# Patient Record
Sex: Male | Born: 1940
Health system: Southern US, Community
[De-identification: ages and names within clinical notes are randomized; demographics above are authoritative.]

## PROBLEM LIST (undated history)

## (undated) ENCOUNTER — Emergency Department (HOSPITAL_COMMUNITY): Discharge status: Absent without leave | Payer: Medicare Other

## (undated) DIAGNOSIS — T1491XA Suicide attempt, initial encounter: Secondary | ICD-10-CM

## (undated) DIAGNOSIS — I219 Acute myocardial infarction, unspecified: Secondary | ICD-10-CM

## (undated) DIAGNOSIS — F329 Major depressive disorder, single episode, unspecified: Secondary | ICD-10-CM

## (undated) DIAGNOSIS — I2510 Atherosclerotic heart disease of native coronary artery without angina pectoris: Secondary | ICD-10-CM

## (undated) DIAGNOSIS — D509 Iron deficiency anemia, unspecified: Secondary | ICD-10-CM

## (undated) DIAGNOSIS — R0602 Shortness of breath: Secondary | ICD-10-CM

## (undated) DIAGNOSIS — I6509 Occlusion and stenosis of unspecified vertebral artery: Secondary | ICD-10-CM

## (undated) DIAGNOSIS — E785 Hyperlipidemia, unspecified: Secondary | ICD-10-CM

## (undated) DIAGNOSIS — D126 Benign neoplasm of colon, unspecified: Secondary | ICD-10-CM

## (undated) DIAGNOSIS — M1990 Unspecified osteoarthritis, unspecified site: Secondary | ICD-10-CM

## (undated) DIAGNOSIS — F0280 Dementia in other diseases classified elsewhere without behavioral disturbance: Secondary | ICD-10-CM

## (undated) DIAGNOSIS — C4492 Squamous cell carcinoma of skin, unspecified: Secondary | ICD-10-CM

## (undated) DIAGNOSIS — R55 Syncope and collapse: Secondary | ICD-10-CM

## (undated) DIAGNOSIS — K922 Gastrointestinal hemorrhage, unspecified: Secondary | ICD-10-CM

## (undated) DIAGNOSIS — I10 Essential (primary) hypertension: Secondary | ICD-10-CM

## (undated) DIAGNOSIS — F32A Depression, unspecified: Secondary | ICD-10-CM

## (undated) DIAGNOSIS — C4491 Basal cell carcinoma of skin, unspecified: Secondary | ICD-10-CM

## (undated) DIAGNOSIS — F0390 Unspecified dementia without behavioral disturbance: Secondary | ICD-10-CM

## (undated) DIAGNOSIS — K449 Diaphragmatic hernia without obstruction or gangrene: Secondary | ICD-10-CM

## (undated) DIAGNOSIS — G309 Alzheimer's disease, unspecified: Secondary | ICD-10-CM

## (undated) DIAGNOSIS — C61 Malignant neoplasm of prostate: Secondary | ICD-10-CM

## (undated) DIAGNOSIS — K219 Gastro-esophageal reflux disease without esophagitis: Secondary | ICD-10-CM

## (undated) DIAGNOSIS — J9383 Other pneumothorax: Secondary | ICD-10-CM

## (undated) DIAGNOSIS — K279 Peptic ulcer, site unspecified, unspecified as acute or chronic, without hemorrhage or perforation: Secondary | ICD-10-CM

## (undated) DIAGNOSIS — K760 Fatty (change of) liver, not elsewhere classified: Secondary | ICD-10-CM

## (undated) DIAGNOSIS — K319 Disease of stomach and duodenum, unspecified: Secondary | ICD-10-CM

## (undated) HISTORY — DX: Gastrointestinal hemorrhage, unspecified: K92.2

## (undated) HISTORY — DX: Peptic ulcer, site unspecified, unspecified as acute or chronic, without hemorrhage or perforation: K27.9

## (undated) HISTORY — DX: Unspecified osteoarthritis, unspecified site: M19.90

## (undated) HISTORY — DX: Diaphragmatic hernia without obstruction or gangrene: K44.9

## (undated) HISTORY — PX: OTHER SURGICAL HISTORY: SHX169

## (undated) HISTORY — PX: CATARACT EXTRACTION: SUR2

## (undated) HISTORY — DX: Malignant neoplasm of prostate: C61

## (undated) HISTORY — DX: Squamous cell carcinoma of skin, unspecified: C44.92

## (undated) HISTORY — PX: CARDIAC SURGERY: SHX584

## (undated) HISTORY — DX: Benign neoplasm of colon, unspecified: D12.6

## (undated) HISTORY — DX: Basal cell carcinoma of skin, unspecified: C44.91

## (undated) HISTORY — DX: Fatty (change of) liver, not elsewhere classified: K76.0

## (undated) HISTORY — PX: PROSTATECTOMY: SHX69

## (undated) HISTORY — PX: LUNG REMOVAL, PARTIAL: SHX233

## (undated) HISTORY — DX: Essential (primary) hypertension: I10

## (undated) HISTORY — DX: Gastro-esophageal reflux disease without esophagitis: K21.9

## (undated) HISTORY — DX: Hyperlipidemia, unspecified: E78.5

## (undated) HISTORY — DX: Disease of stomach and duodenum, unspecified: K31.9

## (undated) HISTORY — DX: Iron deficiency anemia, unspecified: D50.9

## (undated) HISTORY — DX: Occlusion and stenosis of unspecified vertebral artery: I65.09

## (undated) HISTORY — DX: Other pneumothorax: J93.83

---

## 2000-01-02 ENCOUNTER — Encounter: Payer: Self-pay | Admitting: Cardiology

## 2000-01-02 ENCOUNTER — Ambulatory Visit (HOSPITAL_COMMUNITY): Admission: RE | Admit: 2000-01-02 | Discharge: 2000-01-02 | Payer: Self-pay | Admitting: Cardiology

## 2000-04-30 ENCOUNTER — Inpatient Hospital Stay (HOSPITAL_COMMUNITY): Admission: EM | Admit: 2000-04-30 | Discharge: 2000-05-01 | Payer: Self-pay | Admitting: Emergency Medicine

## 2000-04-30 ENCOUNTER — Encounter: Payer: Self-pay | Admitting: Emergency Medicine

## 2000-05-09 ENCOUNTER — Emergency Department (HOSPITAL_COMMUNITY): Admission: EM | Admit: 2000-05-09 | Discharge: 2000-05-09 | Payer: Self-pay | Admitting: Emergency Medicine

## 2001-02-18 ENCOUNTER — Ambulatory Visit (HOSPITAL_COMMUNITY): Admission: RE | Admit: 2001-02-18 | Discharge: 2001-02-18 | Payer: Self-pay | Admitting: Gastroenterology

## 2001-06-24 ENCOUNTER — Ambulatory Visit (HOSPITAL_COMMUNITY): Admission: RE | Admit: 2001-06-24 | Discharge: 2001-06-24 | Payer: Self-pay | Admitting: *Deleted

## 2002-08-21 ENCOUNTER — Inpatient Hospital Stay (HOSPITAL_COMMUNITY): Admission: EM | Admit: 2002-08-21 | Discharge: 2002-08-22 | Payer: Self-pay | Admitting: Emergency Medicine

## 2002-08-21 ENCOUNTER — Encounter: Payer: Self-pay | Admitting: Emergency Medicine

## 2002-08-22 ENCOUNTER — Encounter: Payer: Self-pay | Admitting: Cardiology

## 2008-07-13 ENCOUNTER — Ambulatory Visit (HOSPITAL_COMMUNITY): Admission: RE | Admit: 2008-07-13 | Discharge: 2008-07-13 | Payer: Self-pay | Admitting: Urology

## 2008-09-30 ENCOUNTER — Ambulatory Visit (HOSPITAL_COMMUNITY): Admission: RE | Admit: 2008-09-30 | Discharge: 2008-09-30 | Payer: Self-pay | Admitting: Cardiology

## 2008-10-05 ENCOUNTER — Inpatient Hospital Stay (HOSPITAL_COMMUNITY): Admission: RE | Admit: 2008-10-05 | Discharge: 2008-10-06 | Payer: Self-pay | Admitting: Urology

## 2008-10-05 ENCOUNTER — Encounter (INDEPENDENT_AMBULATORY_CARE_PROVIDER_SITE_OTHER): Payer: Self-pay | Admitting: Urology

## 2010-09-01 ENCOUNTER — Emergency Department (HOSPITAL_COMMUNITY): Admission: EM | Admit: 2010-09-01 | Discharge: 2010-09-01 | Payer: Self-pay | Admitting: Emergency Medicine

## 2010-09-05 ENCOUNTER — Encounter: Admission: RE | Admit: 2010-09-05 | Discharge: 2010-09-05 | Payer: Self-pay | Admitting: Cardiology

## 2010-09-17 ENCOUNTER — Ambulatory Visit (HOSPITAL_COMMUNITY): Admission: RE | Admit: 2010-09-17 | Discharge: 2010-09-17 | Payer: Self-pay | Admitting: Cardiology

## 2010-09-20 ENCOUNTER — Encounter: Payer: Self-pay | Admitting: Interventional Radiology

## 2010-10-03 ENCOUNTER — Inpatient Hospital Stay (HOSPITAL_COMMUNITY): Admission: RE | Admit: 2010-10-03 | Discharge: 2010-10-04 | Payer: Self-pay | Admitting: Interventional Radiology

## 2010-10-17 ENCOUNTER — Encounter: Payer: Self-pay | Admitting: Interventional Radiology

## 2010-11-21 ENCOUNTER — Ambulatory Visit (HOSPITAL_COMMUNITY)
Admission: RE | Admit: 2010-11-21 | Discharge: 2010-11-21 | Payer: Self-pay | Source: Home / Self Care | Attending: Interventional Radiology | Admitting: Interventional Radiology

## 2010-11-26 ENCOUNTER — Ambulatory Visit (HOSPITAL_COMMUNITY)
Admission: RE | Admit: 2010-11-26 | Discharge: 2010-11-26 | Payer: Self-pay | Source: Home / Self Care | Attending: Interventional Radiology | Admitting: Interventional Radiology

## 2010-11-26 ENCOUNTER — Encounter (INDEPENDENT_AMBULATORY_CARE_PROVIDER_SITE_OTHER): Payer: Self-pay | Admitting: Interventional Radiology

## 2010-12-16 DIAGNOSIS — D126 Benign neoplasm of colon, unspecified: Secondary | ICD-10-CM

## 2010-12-16 DIAGNOSIS — K319 Disease of stomach and duodenum, unspecified: Secondary | ICD-10-CM

## 2010-12-16 HISTORY — DX: Disease of stomach and duodenum, unspecified: K31.9

## 2010-12-16 HISTORY — DX: Benign neoplasm of colon, unspecified: D12.6

## 2011-02-26 LAB — CREATININE, SERUM
Creatinine, Ser: 1.01 mg/dL (ref 0.4–1.5)
GFR calc Af Amer: >60 mL/min (ref >60)
GFR calc non Af Amer: >60 mL/min (ref >60)

## 2011-02-26 LAB — BUN: BUN: 11 mg/dL (ref 6–23)

## 2011-02-27 LAB — BASIC METABOLIC PANEL
BUN: 11 mg/dL (ref 6–23)
BUN: 6 mg/dL (ref 6–23)
CO2: 24 mEq/L (ref 19–32)
CO2: 30 mEq/L (ref 19–32)
Calcium: 8.7 mg/dL (ref 8.4–10.5)
Calcium: 9.6 mg/dL (ref 8.4–10.5)
Chloride: 104 mEq/L (ref 96–112)
Chloride: 109 mEq/L (ref 96–112)
Creatinine, Ser: 0.98 mg/dL (ref 0.4–1.5)
Creatinine, Ser: 1.03 mg/dL (ref 0.4–1.5)
GFR calc Af Amer: >60 mL/min (ref >60)
GFR calc Af Amer: >60 mL/min (ref >60)
GFR calc non Af Amer: >60 mL/min (ref >60)
GFR calc non Af Amer: >60 mL/min (ref >60)
Glucose, Bld: 91 mg/dL (ref 70–99)
Glucose, Bld: 99 mg/dL (ref 70–99)
Potassium: 3.6 mEq/L (ref 3.5–5.1)
Potassium: 4.6 mEq/L (ref 3.5–5.1)
Sodium: 140 mEq/L (ref 135–145)
Sodium: 141 mEq/L (ref 135–145)

## 2011-02-27 LAB — CBC
HCT: 40.7 % (ref 39.0–52.0)
HCT: 42.1 % (ref 39.0–52.0)
HCT: 44.3 % (ref 39.0–52.0)
Hemoglobin: 13.9 g/dL (ref 13.0–17.0)
Hemoglobin: 14.4 g/dL (ref 13.0–17.0)
Hemoglobin: 14.7 g/dL (ref 13.0–17.0)
MCH: 28 pg (ref 26.0–34.0)
MCH: 28.7 pg (ref 26.0–34.0)
MCH: 28.9 pg (ref 26.0–34.0)
MCHC: 33.2 g/dL (ref 30.0–36.0)
MCHC: 34.2 g/dL (ref 30.0–36.0)
MCHC: 34.2 g/dL (ref 30.0–36.0)
MCV: 83.9 fL (ref 78.0–100.0)
MCV: 84.4 fL (ref 78.0–100.0)
MCV: 84.5 fL (ref 78.0–100.0)
Platelets: 194 10*3/uL (ref 150–400)
Platelets: 196 10*3/uL (ref 150–400)
Platelets: 203 10*3/uL (ref 150–400)
RBC: 4.85 MIL/uL (ref 4.22–5.81)
RBC: 4.98 MIL/uL (ref 4.22–5.81)
RBC: 5.25 MIL/uL (ref 4.22–5.81)
RDW: 14.2 % (ref 11.5–15.5)
RDW: 14.4 % (ref 11.5–15.5)
RDW: 14.6 % (ref 11.5–15.5)
WBC: 4.5 10*3/uL (ref 4.0–10.5)
WBC: 8.7 10*3/uL (ref 4.0–10.5)
WBC: 9.5 10*3/uL (ref 4.0–10.5)

## 2011-02-27 LAB — DIFFERENTIAL
Basophils Absolute: 0 10*3/uL (ref 0.0–0.1)
Basophils Relative: 1 % (ref 0–1)
Eosinophils Absolute: 0.1 10*3/uL (ref 0.0–0.7)
Eosinophils Relative: 2 % (ref 0–5)
Lymphocytes Relative: 35 % (ref 12–46)
Lymphs Abs: 1.6 10*3/uL (ref 0.7–4.0)
Monocytes Absolute: 0.6 10*3/uL (ref 0.1–1.0)
Monocytes Relative: 13 % — ABNORMAL HIGH (ref 3–12)
Neutro Abs: 2.2 10*3/uL (ref 1.7–7.7)
Neutrophils Relative %: 49 % (ref 43–77)

## 2011-02-27 LAB — APTT
aPTT: 29 seconds (ref 24–37)
aPTT: 45 seconds — ABNORMAL HIGH (ref 24–37)

## 2011-02-27 LAB — PROTIME-INR
INR: 1.1 (ref 0.00–1.49)
INR: 1.17 (ref 0.00–1.49)
Prothrombin Time: 14.4 seconds (ref 11.6–15.2)
Prothrombin Time: 15.1 seconds (ref 11.6–15.2)

## 2011-02-27 LAB — HEPARIN LEVEL (UNFRACTIONATED): Heparin Unfractionated: 0.37 IU/mL (ref 0.30–0.70)

## 2011-02-27 LAB — MRSA PCR SCREENING: MRSA by PCR: NEGATIVE

## 2011-02-28 LAB — CBC
HCT: 43.8 % (ref 39.0–52.0)
HCT: 44.3 % (ref 39.0–52.0)
Hemoglobin: 15 g/dL (ref 13.0–17.0)
Hemoglobin: 15.1 g/dL (ref 13.0–17.0)
MCH: 28.6 pg (ref 26.0–34.0)
MCH: 29.3 pg (ref 26.0–34.0)
MCHC: 34.2 g/dL (ref 30.0–36.0)
MCHC: 34.2 g/dL (ref 30.0–36.0)
MCV: 83.6 fL (ref 78.0–100.0)
MCV: 85.7 fL (ref 78.0–100.0)
Platelets: 200 10*3/uL (ref 150–400)
Platelets: 218 10*3/uL (ref 150–400)
RBC: 5.24 MIL/uL (ref 4.22–5.81)
RBC: 5.17 MIL/uL (ref 4.22–5.81)
RDW: 14.1 % (ref 11.5–15.5)
RDW: 14.7 % (ref 11.5–15.5)
WBC: 5.7 10*3/uL (ref 4.0–10.5)
WBC: 6.6 10*3/uL (ref 4.0–10.5)

## 2011-02-28 LAB — BASIC METABOLIC PANEL
BUN: 12 mg/dL (ref 6–23)
BUN: 11 mg/dL (ref 6–23)
CO2: 29 mEq/L (ref 19–32)
CO2: 28 mEq/L (ref 19–32)
Calcium: 9.2 mg/dL (ref 8.4–10.5)
Calcium: 9.1 mg/dL (ref 8.4–10.5)
Chloride: 106 mEq/L (ref 96–112)
Chloride: 106 mEq/L (ref 96–112)
Creatinine, Ser: 0.98 mg/dL (ref 0.4–1.5)
Creatinine, Ser: 1.04 mg/dL (ref 0.4–1.5)
GFR calc Af Amer: >60 mL/min (ref >60)
GFR calc Af Amer: >60 mL/min (ref >60)
GFR calc non Af Amer: >60 mL/min (ref >60)
GFR calc non Af Amer: >60 mL/min (ref >60)
Glucose, Bld: 91 mg/dL (ref 70–99)
Glucose, Bld: 88 mg/dL (ref 70–99)
Potassium: 4.8 mEq/L (ref 3.5–5.1)
Potassium: 4.1 mEq/L (ref 3.5–5.1)
Sodium: 139 mEq/L (ref 135–145)
Sodium: 139 mEq/L (ref 135–145)

## 2011-02-28 LAB — DIFFERENTIAL
Basophils Absolute: 0 10*3/uL (ref 0.0–0.1)
Basophils Relative: 0 % (ref 0–1)
Eosinophils Absolute: 0.1 10*3/uL (ref 0.0–0.7)
Eosinophils Relative: 1 % (ref 0–5)
Lymphocytes Relative: 21 % (ref 12–46)
Lymphs Abs: 1.4 10*3/uL (ref 0.7–4.0)
Monocytes Absolute: 0.5 10*3/uL (ref 0.1–1.0)
Monocytes Relative: 8 % (ref 3–12)
Neutro Abs: 4.6 10*3/uL (ref 1.7–7.7)
Neutrophils Relative %: 69 % (ref 43–77)

## 2011-02-28 LAB — POCT CARDIAC MARKERS
CKMB, poc: <1 ng/mL — ABNORMAL LOW (ref 1.0–8.0)
CKMB, poc: <1 ng/mL — ABNORMAL LOW (ref 1.0–8.0)
Myoglobin, poc: 47.8 ng/mL (ref 12–200)
Myoglobin, poc: 63.1 ng/mL (ref 12–200)
Troponin i, poc: <0.05 ng/mL (ref 0.00–0.09)
Troponin i, poc: <0.05 ng/mL (ref 0.00–0.09)

## 2011-02-28 LAB — URINALYSIS, ROUTINE W REFLEX MICROSCOPIC
Bilirubin Urine: NEGATIVE
Glucose, UA: NEGATIVE mg/dL
Hgb urine dipstick: NEGATIVE
Ketones, ur: NEGATIVE mg/dL
Nitrite: NEGATIVE
Protein, ur: NEGATIVE mg/dL
Specific Gravity, Urine: 1.005 (ref 1.005–1.030)
Urobilinogen, UA: 0.2 mg/dL (ref 0.0–1.0)
pH: 7 (ref 5.0–8.0)

## 2011-02-28 LAB — GLUCOSE, CAPILLARY: Glucose-Capillary: 93 mg/dL (ref 70–99)

## 2011-02-28 LAB — PROTIME-INR
INR: 1.02 (ref 0.00–1.49)
Prothrombin Time: 13.6 seconds (ref 11.6–15.2)

## 2011-04-29 ENCOUNTER — Inpatient Hospital Stay (HOSPITAL_COMMUNITY)
Admission: EM | Admit: 2011-04-29 | Discharge: 2011-05-08 | DRG: 379 | Disposition: A | Discharge status: Home / Self Care | Payer: Medicare Other | Attending: Cardiology | Admitting: Cardiology

## 2011-04-29 ENCOUNTER — Emergency Department (HOSPITAL_COMMUNITY)
Admission: EM | Admit: 2011-04-29 | Discharge: 2011-04-29 | Disposition: A | Discharge status: Other Acute Inpatient Hospital | Payer: Medicare Other | Attending: Emergency Medicine | Admitting: Emergency Medicine

## 2011-04-29 DIAGNOSIS — I679 Cerebrovascular disease, unspecified: Secondary | ICD-10-CM | POA: Diagnosis present

## 2011-04-29 DIAGNOSIS — K5900 Constipation, unspecified: Secondary | ICD-10-CM | POA: Diagnosis present

## 2011-04-29 DIAGNOSIS — R55 Syncope and collapse: Secondary | ICD-10-CM | POA: Insufficient documentation

## 2011-04-29 DIAGNOSIS — E7800 Pure hypercholesterolemia, unspecified: Secondary | ICD-10-CM | POA: Insufficient documentation

## 2011-04-29 DIAGNOSIS — Z8546 Personal history of malignant neoplasm of prostate: Secondary | ICD-10-CM

## 2011-04-29 DIAGNOSIS — M1990 Unspecified osteoarthritis, unspecified site: Secondary | ICD-10-CM | POA: Diagnosis present

## 2011-04-29 DIAGNOSIS — R42 Dizziness and giddiness: Secondary | ICD-10-CM | POA: Insufficient documentation

## 2011-04-29 DIAGNOSIS — K922 Gastrointestinal hemorrhage, unspecified: Secondary | ICD-10-CM | POA: Insufficient documentation

## 2011-04-29 DIAGNOSIS — K219 Gastro-esophageal reflux disease without esophagitis: Secondary | ICD-10-CM | POA: Diagnosis present

## 2011-04-29 DIAGNOSIS — Z7982 Long term (current) use of aspirin: Secondary | ICD-10-CM

## 2011-04-29 DIAGNOSIS — K2971 Gastritis, unspecified, with bleeding: Principal | ICD-10-CM | POA: Diagnosis present

## 2011-04-29 DIAGNOSIS — Z79899 Other long term (current) drug therapy: Secondary | ICD-10-CM | POA: Insufficient documentation

## 2011-04-29 DIAGNOSIS — I252 Old myocardial infarction: Secondary | ICD-10-CM | POA: Insufficient documentation

## 2011-04-29 DIAGNOSIS — I10 Essential (primary) hypertension: Secondary | ICD-10-CM | POA: Diagnosis present

## 2011-04-29 DIAGNOSIS — I771 Stricture of artery: Secondary | ICD-10-CM

## 2011-04-29 DIAGNOSIS — R61 Generalized hyperhidrosis: Secondary | ICD-10-CM | POA: Insufficient documentation

## 2011-04-29 DIAGNOSIS — D500 Iron deficiency anemia secondary to blood loss (chronic): Secondary | ICD-10-CM | POA: Diagnosis present

## 2011-04-29 DIAGNOSIS — Z87891 Personal history of nicotine dependence: Secondary | ICD-10-CM

## 2011-04-29 DIAGNOSIS — R195 Other fecal abnormalities: Secondary | ICD-10-CM | POA: Insufficient documentation

## 2011-04-29 DIAGNOSIS — D649 Anemia, unspecified: Secondary | ICD-10-CM | POA: Insufficient documentation

## 2011-04-29 DIAGNOSIS — Z9889 Other specified postprocedural states: Secondary | ICD-10-CM | POA: Insufficient documentation

## 2011-04-30 ENCOUNTER — Other Ambulatory Visit (HOSPITAL_COMMUNITY): Payer: Self-pay

## 2011-04-30 ENCOUNTER — Emergency Department (HOSPITAL_COMMUNITY): Payer: Medicare Other

## 2011-04-30 LAB — CBC
HCT: 19.1 % — ABNORMAL LOW (ref 39.0–52.0)
HCT: 24 % — ABNORMAL LOW (ref 39.0–52.0)
Hemoglobin: 6.7 g/dL — CL (ref 13.0–17.0)
Hemoglobin: 8.7 g/dL — ABNORMAL LOW (ref 13.0–17.0)
MCH: 28.5 pg (ref 26.0–34.0)
MCH: 30.2 pg (ref 26.0–34.0)
MCHC: 35.1 g/dL (ref 30.0–36.0)
MCHC: 36.3 g/dL — ABNORMAL HIGH (ref 30.0–36.0)
MCV: 81.3 fL (ref 78.0–100.0)
MCV: 83.3 fL (ref 78.0–100.0)
Platelets: 185 10*3/uL (ref 150–400)
Platelets: 139 10*3/uL — ABNORMAL LOW (ref 150–400)
RBC: 2.35 MIL/uL — ABNORMAL LOW (ref 4.22–5.81)
RBC: 2.88 MIL/uL — ABNORMAL LOW (ref 4.22–5.81)
RDW: 15.3 % (ref 11.5–15.5)
RDW: 14.9 % (ref 11.5–15.5)
WBC: 8.8 10*3/uL (ref 4.0–10.5)
WBC: 7.4 10*3/uL (ref 4.0–10.5)

## 2011-04-30 LAB — FERRITIN: Ferritin: 33 ng/mL (ref 22–322)

## 2011-04-30 LAB — LIPID PANEL
Cholesterol: 113 mg/dL (ref 0–200)
HDL: 43 mg/dL (ref >39)
LDL Cholesterol: 56 mg/dL (ref 0–99)
Total CHOL/HDL Ratio: 2.6 RATIO
Triglycerides: 68 mg/dL (ref <150)
VLDL: 14 mg/dL (ref 0–40)

## 2011-04-30 LAB — CK TOTAL AND CKMB (NOT AT ARMC)
CK, MB: 2.5 ng/mL (ref 0.3–4.0)
Relative Index: 1.7 (ref 0.0–2.5)
Total CK: 149 U/L (ref 7–232)

## 2011-04-30 LAB — VITAMIN B12: Vitamin B-12: 149 pg/mL — ABNORMAL LOW (ref 211–911)

## 2011-04-30 LAB — BASIC METABOLIC PANEL
BUN: 29 mg/dL — ABNORMAL HIGH (ref 6–23)
CO2: 26 mEq/L (ref 19–32)
Calcium: 8.6 mg/dL (ref 8.4–10.5)
Chloride: 106 mEq/L (ref 96–112)
Creatinine, Ser: 0.94 mg/dL (ref 0.4–1.5)
GFR calc Af Amer: >60 mL/min (ref >60)
GFR calc non Af Amer: >60 mL/min (ref >60)
Glucose, Bld: 129 mg/dL — ABNORMAL HIGH (ref 70–99)
Potassium: 3.6 mEq/L (ref 3.5–5.1)
Sodium: 137 mEq/L (ref 135–145)

## 2011-04-30 LAB — OCCULT BLOOD, POC DEVICE: Fecal Occult Bld: POSITIVE

## 2011-04-30 LAB — CARDIAC PANEL(CRET KIN+CKTOT+MB+TROPI)
CK, MB: 2 ng/mL (ref 0.3–4.0)
Relative Index: 1 (ref 0.0–2.5)
Total CK: 200 U/L (ref 7–232)
Troponin I: <0.3 ng/mL (ref <0.30)

## 2011-04-30 LAB — IRON AND TIBC
Iron: 78 ug/dL (ref 42–135)
Saturation Ratios: 25 % (ref 20–55)
TIBC: 311 ug/dL (ref 215–435)
UIBC: 233 ug/dL

## 2011-04-30 LAB — ABO/RH: ABO/RH(D): A POS

## 2011-04-30 LAB — TROPONIN I: Troponin I: <0.3 ng/mL (ref <0.30)

## 2011-04-30 LAB — FOLATE: Folate: 14.5 ng/mL

## 2011-04-30 NOTE — Op Note (Signed)
NAME:  DUKE, WEISENSEL                ACCOUNT NO.:  [id number]   MEDICAL RECORD NO.:  [id number]          PATIENT TYPE:  INP   LOCATION:  0004                         FACILITY:  [facility]   PHYSICIAN:  Lucrezia Starch. Earlene Plater, M.D.  DATE OF BIRTH:  Apr 14, 1941   DATE OF PROCEDURE:  10/05/2008  DATE OF DISCHARGE:                               OPERATIVE REPORT   DIAGNOSIS:  Adenocarcinoma of the prostate.   OPERATIVE PROCEDURE:  Robotic-assisted laparoscopic radical  prostatectomy with bilateral pelvic lymphadenectomy.   SURGEON:  Lucrezia Starch. Earlene Plater, M.D.   ASSISTANT:  Excell Seltzer. Annabell Howells, M.D., and Delia Chimes, N.P.C.   ANESTHESIA:  General endotracheal.   ESTIMATED BLOOD LOSS:  150 mL.   TUBES:  20-French coude Foley catheter and large round Blake drain.   COMPLICATIONS:  None.   INDICATIONS FOR PROCEDURE:  Mr. Gains is a very nice 70 year old white  male who presented with a right palpable prostatic nodule and a PSA of  5.23. He underwent ultrasound and biopsy of the prostate which revealed  a Gleason score 6, 3 + 3, from the right apex of the prostate and a  Gleason score 7, 3 + 3  in 40% of the right mid prostate and then a  Gleason score 8, 4 + 4, in 20% right base of the prostate. His  metastatic workup consisted of a CT scan of the abdomen and pelvis, and  bone scan revealed no metastatic disease. He had a remote cardiac  history and underwent cardiac workup which was essentially negative.  After understanding risks, benefits and alternatives, he has elected to  proceed with robotic-assisted laparoscopic radical prostatectomy.   PROCEDURE IN DETAIL:  The patient was placed in supine position after  proper general endotracheal anesthesia. Was placed in the exaggerated  lithotomy position and prepped and draped with Betadine in sterile  fashion. A 22-French 30-mL balloon Foley was placed, and the bladder was  drained clear. A periumbilical incision was made on the left  periumbilical area,  appropriate measurement. Sharp dissection was  carried down to the fascia. The fascia was incised. The peritoneum was  punctured. A 12-French camera port was placed, and the bladder was  insufflated with carbon dioxide. Inspection of the abdomen revealed  there were no significant abnormalities noted. The bladder was filled  with approximately 200 mL of sterile water, and right and left robotic  arm ports along with fourth arm ports were measured and placed in  appropriate position under direct vision, as was the 5-mm and 12-mm  right working ports. The dissection was begun. The median and medial  umbilical ligaments were taken down. The space of Retzius was entered  and dissected free, and the pelvic fascia was incised bilaterally, and  puboprostatic ligaments were partially incised. Pudendal's were taken  down from the prostatic apex. The superficial dorsal vein of the penis  was coagulated with bipolar and incised, and the deep dorsal vein  complex was identified. Utilizing the endovascular stapler, it was  stapled, and the prostate was released. Bladder neck was then approached  and was taken down from  the prostate carefully. Indigo carmine was given  IV to identify ureteral orifices. The posterior bladder neck was taken  down as was the seminal vesicles were excised, and the ampullae of the  vas deferens were incised. Good hemostasis noted to be present.  Denonvilliers fascia was then taken down posteriorly. It was felt that a  nerve preservation should be prepared on the left side if possible, but  the right needed to be taken wide. He had significant cancer on the  right side on biopsy. A wide resection was then taken down. The right  pedicle was taken down in packets and clipped with Hem-o-lok clips and  taken down to the apex of the prostate. A left nerve spare was performed  utilizing the veil technique. Posterolateral tissue was preserved, and  again, the pedicle was taken in  serial packets and clipped with Hem-o-  lok clips. The apex was then approached. The urethra was incised  sharply, and the prostate was placed in the right lower quadrant. Good  hemostasis was noted to be present. The pelvis was irrigated and  insufflated. The rectum was insufflated with air, and there was no  rectal injury noted. A right and left pelvic lymph node dissection was  then performed. Both the obturator and external iliac lymph nodes were  excised. The obturator nerve was identified and protected bilaterally.  All lymphatics were clipped proximally and distally, and there was a  large accessory obturator vein on the right side that was clipped with  Hem-o-lok clips and excised with the specimen. Following this, the  anastomosis was then approached. The bladder neck was approximated to  the urethra in 6 o'clock position with 2-0 Vicryl suture, and the  anastomosis was completed with running 3-0 Monocryl, both dyed and  undyed tied posteriorly underneath the bladder and in a running fashion.  A 20-French coude catheter was easily passed into the bladder and  inflated with 15 mL of sterile water and irrigated blue dye. Again, good  hemostasis was noted to be present. The specimen was grasped with an  EndoCatch device through the camera port. The right 12-mm working port  was closed under direct vision with a suture passer and a 2-0 Vicryl  suture. All ports were visually removed, and good hemostasis was  present. The specimen was removed through the periumbilical incision,  and the fascia of the periumbilical incision was closed with a running 2-  0 Vicryl suture. All wounds were irrigated, injected with 0.25% Marcaine  and closed with staples. A large round Blake drain had been placed  through the fourth arm port and sutured in place with nylon suture. All  wounds were dressed sterilely. The bladder was again irrigated clear,  and the patient was taken to the recovery room  stable.      Ronald L. Earlene Plater, M.D.  Electronically Signed     RLD/MEDQ  D:  10/05/2008  T:  10/05/2008  Job:  [id number]

## 2011-05-01 ENCOUNTER — Other Ambulatory Visit: Payer: Self-pay | Admitting: Gastroenterology

## 2011-05-01 LAB — CBC
HCT: 24.2 % — ABNORMAL LOW (ref 39.0–52.0)
Hemoglobin: 8.4 g/dL — ABNORMAL LOW (ref 13.0–17.0)
MCH: 29.4 pg (ref 26.0–34.0)
MCHC: 34.7 g/dL (ref 30.0–36.0)
MCV: 84.6 fL (ref 78.0–100.0)
Platelets: 148 10*3/uL — ABNORMAL LOW (ref 150–400)
RBC: 2.86 MIL/uL — ABNORMAL LOW (ref 4.22–5.81)
RDW: 15 % (ref 11.5–15.5)
WBC: 6.3 10*3/uL (ref 4.0–10.5)

## 2011-05-01 LAB — CARDIAC PANEL(CRET KIN+CKTOT+MB+TROPI)
CK, MB: 1.6 ng/mL (ref 0.3–4.0)
Relative Index: 1 (ref 0.0–2.5)
Total CK: 162 U/L (ref 7–232)
Troponin I: <0.3 ng/mL (ref <0.30)

## 2011-05-01 LAB — BASIC METABOLIC PANEL
BUN: 14 mg/dL (ref 6–23)
CO2: 24 mEq/L (ref 19–32)
Calcium: 8.4 mg/dL (ref 8.4–10.5)
Chloride: 108 mEq/L (ref 96–112)
Creatinine, Ser: 0.89 mg/dL (ref 0.4–1.5)
GFR calc Af Amer: >60 mL/min (ref >60)
GFR calc non Af Amer: >60 mL/min (ref >60)
Glucose, Bld: 97 mg/dL (ref 70–99)
Potassium: 3.5 mEq/L (ref 3.5–5.1)
Sodium: 140 mEq/L (ref 135–145)

## 2011-05-01 LAB — GLUCOSE, CAPILLARY: Glucose-Capillary: 137 mg/dL — ABNORMAL HIGH (ref 70–99)

## 2011-05-01 LAB — PREPARE RBC (CROSSMATCH)

## 2011-05-02 ENCOUNTER — Inpatient Hospital Stay (HOSPITAL_COMMUNITY): Payer: Medicare Other

## 2011-05-02 LAB — CROSSMATCH
ABO/RH(D): A POS
Antibody Screen: NEGATIVE
Unit division: 0
Unit division: 0
Unit division: 0
Unit division: 0

## 2011-05-02 LAB — CBC
HCT: 30.3 % — ABNORMAL LOW (ref 39.0–52.0)
Hemoglobin: 10.4 g/dL — ABNORMAL LOW (ref 13.0–17.0)
MCH: 28.7 pg (ref 26.0–34.0)
MCHC: 34.3 g/dL (ref 30.0–36.0)
MCV: 83.7 fL (ref 78.0–100.0)
Platelets: 148 10*3/uL — ABNORMAL LOW (ref 150–400)
RBC: 3.62 MIL/uL — ABNORMAL LOW (ref 4.22–5.81)
RDW: 14.7 % (ref 11.5–15.5)
WBC: 5.9 10*3/uL (ref 4.0–10.5)

## 2011-05-02 LAB — PROTIME-INR
INR: 1.16 (ref 0.00–1.49)
Prothrombin Time: 15 seconds (ref 11.6–15.2)

## 2011-05-03 ENCOUNTER — Inpatient Hospital Stay (HOSPITAL_COMMUNITY): Payer: Medicare Other

## 2011-05-03 DIAGNOSIS — I498 Other specified cardiac arrhythmias: Secondary | ICD-10-CM

## 2011-05-03 LAB — BASIC METABOLIC PANEL
BUN: 10 mg/dL (ref 6–23)
CO2: 27 mEq/L (ref 19–32)
Calcium: 8.4 mg/dL (ref 8.4–10.5)
Chloride: 108 mEq/L (ref 96–112)
Creatinine, Ser: 0.91 mg/dL (ref 0.4–1.5)
GFR calc Af Amer: >60 mL/min (ref >60)
GFR calc non Af Amer: >60 mL/min (ref >60)
Glucose, Bld: 98 mg/dL (ref 70–99)
Potassium: 3.4 mEq/L — ABNORMAL LOW (ref 3.5–5.1)
Sodium: 140 mEq/L (ref 135–145)

## 2011-05-03 LAB — CBC
HCT: 30.2 % — ABNORMAL LOW (ref 39.0–52.0)
Hemoglobin: 10.3 g/dL — ABNORMAL LOW (ref 13.0–17.0)
MCH: 28.8 pg (ref 26.0–34.0)
MCHC: 34.1 g/dL (ref 30.0–36.0)
MCV: 84.4 fL (ref 78.0–100.0)
Platelets: 183 10*3/uL (ref 150–400)
RBC: 3.58 MIL/uL — ABNORMAL LOW (ref 4.22–5.81)
RDW: 15.1 % (ref 11.5–15.5)
WBC: 6 10*3/uL (ref 4.0–10.5)

## 2011-05-03 LAB — APTT: aPTT: 31 seconds (ref 24–37)

## 2011-05-03 MED ORDER — IOHEXOL 300 MG/ML  SOLN
150.0000 mL | Freq: Once | INTRAMUSCULAR | Status: AC | PRN
  Administered 2011-05-03: 70 mL via INTRAVENOUS

## 2011-05-03 NOTE — Discharge Summary (Signed)
[place]. [facility]  Patient:    Anthony Skinner, Anthony Skinner                       MRN: [id number] Adm. Date:  [id number] Disc. Date: [id number] Attending:  Robynn Pane CC:         Anselmo Rod, M.D.                           Discharge Summary  ADMITTING DIAGNOSES: 1. Chest pain, rule out myocardial infarction. 2. Hypercholesterolemia. 3. History of tobacco abuse. 4. Positive family history of coronary artery disease.  FINAL DIAGNOSES: 1. Chest pain, rule out gastroesophageal reflux disease, one-vessel mild    coronary artery disease. 2. Hypercholesterolemia. 3. History of tobacco abuse. 4. Positive family history of coronary artery disease.  DISCHARGE MEDICATIONS: 1. Lipitor 10 mg one tablet daily. 2. Nexium 40 mg one capsule daily. 3. Nitrostat 0.4 mg sublingual, use as directed. 4. Enteric-coated aspirin 81 mg two tablets daily.  ACTIVITY:  Avoid heavy lifting, pushing, or pulling for 48 hours.  DIET:  Low-salt, low-cholesterol diet.  DISCHARGE INSTRUCTIONS:  Post cardiac catheterization instructions have been given.  FOLLOW-UP:  Follow up with me in one week.  CONDITION AT DISCHARGE:  Stable.  HISTORY OF PRESENT ILLNESS:  [name] is a 70 year old white male with past medical history significant for questionable MI in 1988, history of hypercholesterolemia, tobacco abuse, strong family history for coronary artery disease.  He came to the ER via EMS complaining of retrosternal chest pressure radiating to both arms associated with diaphoresis and shortness of breath. Pain was grade 10/10.  He received two sublingual nitroglycerin with partial relief and then four baby aspirin and two more sublingual nitroglycerin by EMS with relief of chest pain.  He states he has been having vague chest pain off and on for the last four or five months.  He had stress Cardiolite in January which was negative for ischemia.  Patient denies any nausea or  vomiting, denies abdominal pain, denies palpation, light-headedness, or syncope.  Denies fever, chills, cough.  PAST MEDICAL HISTORY:  As above.  Patient also has history of herniated disk of her lower back for which she was advised operation many years ago.  PAST SURGICAL HISTORY:  Left lung lobectomy in 1966 for recurrent pneumothorax.  SOCIAL HISTORY:  Married.  Born in [place].  Raised in [place].  Lives in [place].  Smoked two packs per day for 40+ years, quit 3-1/2 years ago.  He used to drink heavily, hard liquor for 30 years, quit approximately eight years ago.  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  FAMILY HISTORY:  Father died of MI at the age of 77.  He had first MI in his 74s.  Mother died of Alzheimers disease.  She was 53 years of age.  One brother is 63.  He is diabetic.  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 One brother had CA of the jaw, one sister has been in good health.  HOME MEDICATIONS: 1. Nexium 40 mg p.o. q.d. 2. Enteric-coated aspirin one p.o. q.d. 3. Naprosyn. 4. ______ for leg cramps on p.r.n. basis.  PHYSICAL EXAMINATION:  GENERAL:  He was alert, awake, and oriented x 3.  VITAL SIGNS:  Blood pressure was 126/58, pulse was 66, regular.  HEENT:  Conjunctivae were pink.  NECK:  Supple.  No JVD, no bruit.  LUNGS:  Clear to auscultation without rhonchi or rales with decreased breath sounds at the bases.  CARDIOVASCULAR:  S1, S2 was normal.  There was no S3, gallop.  ABDOMEN:  Soft.  Bowel sounds are present, nontender.  EXTREMITIES:  No clubbing, cyanosis, or edema.  LABORATORY DATA:  EKG showed normal sinus rhythm with nonspecific T wave changes in V1 and V2.  His two sets of CPK and troponins were negative. Hemoglobin was 14.0.  Glucose was 115, potassium 4.2, BUN 13, creatinine 1.2.  HOSPITAL COURSE:  Patient was admitted to telemetry unit.  MI was ruled out by serial  enzymes and EKG.  Patient was started on beta blockers, aspirin, and heparin.  Patient had one episode of chest pain today, in the morning, and also had chest pain last night which he did not report to the nurse.  Patient received one sublingual nitroglycerin with relief of chest pain.  EKG done showed no acute ischemic changes.  Due to recurrent chest pain and multiple risk factors, patient underwent left cardiac catheterization as per catheterization report today.  Patient tolerated the procedure well.  There were complications.  His femoral arterial sheath has been pulled.  There is no evidence of hematoma.  Patient has been ambulating in the hallway without any problems.  There were no further episodes of chest pain during the hospital stay.  Patient will be discharged home on above medications and will be followed up in my office in one week and, if he continues to have chest pain, we will get GI consultation as outpatient. DD:  05/01/00 TD:  05/05/00 Job: [id number]

## 2011-05-03 NOTE — Procedures (Signed)
[place]. [facility]  Patient:    Anthony Skinner, Anthony Skinner                      MRN: [id number] Proc. Date: 06/24/01 Adm. Date:  06/24/01 Attending:  Anselmo Rod, M.D. CC:         [name], M.D.   Procedure Report  DATE OF BIRTH:  12-06-1941.  PROCEDURE:  Colonoscopy.  ENDOSCOPIST:  Anselmo Rod, M.D.  INSTRUMENTS USED:  Olympus video colonoscope.  INDICATIONS:  A 70 year old white male with a history of trace guaiac positive stool and a history of constipation, rule out colonic out polyps, masses, hemorrhoids, etc.  INFORMED CONSENT:  Informed consent was procured from the patient.  The patient was fasted for 8 hours prior to the procedure and prepped with a bottle of magnesium citrate and a gallon of nulytely the night prior to the procedure.  PREPROCEDURE PHYSICAL EXAMINATION:  VITAL SIGNS:  The patient had stable vital signs.  NECK:  Neck is supple.  CHEST:  Clear to auscultation.  S1, S2 regular.  ABDOMEN:  Soft with normal bowel sounds.  DESCRIPTION OF PROCEDURE:  The patient was placed in the left lateral decubitus position and sedated with 50 mg of Demerol and 7 mg of Versed intravenously.  Once the patient was adequately sedated and maintained on low flow oxygen, and continuous cardiac monitoring, the Olympus video colonoscope was advanced from the rectum to the cecum with difficulty secondary to a large amount of residual stool in the colon.  The patients position was changed from the left lateral to the supine position to facilitate adequate visualization.  No masses or polyps were seen.  Small lesions could have been missed secondary to the inadequate prep.  A small internal hemorrhoid was seen on retroflexion.  The patient tolerated the procedure well without complication.  IMPRESSION: 1. Small nonbleeding internal hemorrhoids. 2. No masses or polyps seen. 3. Large amount of residual stool in the colon.  Small  lesions could have been    missed.  RECOMMENDATIONS: 1. Repeat guaiacs on an outpatient basis. 2. High fiber diet. 3. Outpatient followup in the next 4 weeks. DD:  06/24/01 TD:  06/24/01 Job: [id number]

## 2011-05-03 NOTE — Procedures (Signed)
[place]. [facility]  Patient:    Anthony Skinner, Anthony Skinner                       MRN: [id number] Proc. Date: 02/18/01 Adm. Date:  [id number] Attending:  Charna Elizabeth CC:         [name], M.D.   Procedure Report  DATE OF BIRTH:  [birth year].  PROCEDURE:  Esophagogastroduodenoscopy with biopsies.  ENDOSCOPIST:  Anselmo Rod, M.D.  INSTRUMENT USED:  Olympus video panendoscope.  INDICATION FOR PROCEDURE:  A 70 year old white male with a history of epigastric pain, somewhat better on Nexium but not completely resolved.  Rule out peptic ulcer disease, esophagitis, gastritis, etc.  PREPROCEDURE PREPARATION:  Informed consent was procured from the patient. The patient was fasted for eight hours prior to the procedure.  PREPROCEDURE PHYSICAL:  VITAL SIGNS:  The patient had stable vital signs.  NECK:  Supple.  CHEST:  Clear to auscultation.  S1, S2 regular.  ABDOMEN:  Soft with normal abdominal bowel sounds.  DESCRIPTION OF PROCEDURE:  The patient was placed in the left lateral decubitus position and sedated with 60 mg of Demerol and 5 mg of Versed intravenously.  Once the patient was adequately sedate and maintained on low-flow oxygen and continuous cardiac monitoring, the Olympus video panendoscope was advanced through the mouthpiece, over the tongue, into the esophagus under direct vision.  The entire esophagus appeared normal without evidence of ring, stricture, masses, erosions, esophagitis, or Barretts mucosa.  A small hiatal hernia was seen on high retroflexion in the stomach. There was significant gastritis in the proximal half of the stomach.  Biopsies were done to rule out the presence of Helicobacter pylori by CLOtest.  No frank ulcers were seen.  The rest of the gastric mucosa as well as the proximal small bowel appeared normal.  There was no outlet obstruction.  The patient tolerated the procedure well without  complication.  IMPRESSION: 1. Normal-appearing esophagus and proximal small bowel. 2. Small hiatal hernia. 3. Significant gastritis in proximal half of stomach.  No ulcers seen.    Biopsies done for Helicobacter pylori.  RECOMMENDATIONS: 1. Continue Nexium for now. 2. Await pathology. 3. Treat with antibiotics of H. pylori is present. 4. Outpatient follow-up in the next two weeks. 5. Avoid all nonsteroidals including aspirin. DD:  02/18/01 TD:  02/18/01 Job: [id number]

## 2011-05-03 NOTE — Cardiovascular Report (Signed)
[place]. [facility]  Patient:    DEMIR, TITSWORTH                       MRN: [id number] Proc. Date: 05/01/00 Adm. Date:  [id number] Disc. Date: [id number] Attending:  Robynn Pane CC:         [name], M.D.             Cardiac Catheterization Laboratory                        Cardiac Catheterization  PROCEDURE:  Left cardiac catheterization with selective left and right coronary angiography, left ventriculography via right groin using Judkins technique.  INDICATIONS FOR PROCEDURE:  Mr. Rote is a 70 year old white male with a past medical history significant for questionable MI in 1988, history of hypercholesterolemia, tobacco abuse, strong family history for coronary artery disease.  He came to the ER via EMS complaining of retrosternal chest pressure radiating in both arms associated with diaphoresis and shortness of breath. The pain was grade 10/10.  He received two sublingual nitroglycerin with partial relief and then four baby aspirin and two more sublingual nitroglycerin with relief of chest pain.  He states he has been having chest pain off and on for the last few months.  The patient had a stress Cardiolite in January of 2000 which was negative for ischemia.  Denies any nausea, vomiting or diarrhea.  Denies palpitations, lightheadedness or syncope. Denies abdominal pain.  Denies relation of chest pain to food or movement. Denies any fever, chills or cough.  PAST MEDICAL HISTORY:  As above.  He also has a herniated lower disk for which he was accommodated with laminectomy in the past.  PAST SURGICAL HISTORY:  He had left lung lobectomy in 1966 for recurrent pneumothorax.  SOCIAL HISTORY:  He is married, born in ______ in [place], lives now in [place].  Smoked two packs for four days for 40 years, quit approximately 3 years ago.  He used to drink heavily hard liquor for 30 years, quit eight years ago.  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  FAMILY HISTORY:  Father died of MI at the age of 47.  He had two prior MIs, the first MI was in his 54s.  Mother died of Alzheimers disease at the age of 34.  One brother had four MIs, the first MI was in his 25s.  He is a diabetic. One brother has CA of the jaw.  One sister in good health.  PHYSICAL EXAMINATION:  On examination, he was alert and oriented x 3, in no acute distress.  Blood pressure is 126/58, pulse of 66, regular.  Conjunctiva is pink.  Neck: Supple, no JVD, no bruits.  Lungs are clear to auscultation without rhonchi or rales, with decreased breath sounds at bases. Cardiovascular examination:  S1 and S2 are normal.  There is no S3 gallop. Abdomen:  Soft.  Bowel sounds are present.  Nontender.  Extremities: There is no clubbing, cyanosis or edema.  ECG showed some nonspecific T wave changes in V1 and V2, otherwise no acute ischemic changes.  HOSPITAL COURSE:  The patient was admitted to telemetry unit.  MI was ruled out by serial enzymes and ECG.  The patient had recurrent episode of chest pain which responded to sublingual nitroglycerin.  Due to recurrent chest pain and nonspecific ECG changes and multiple risk factors, the patient was  advised for catheterization, possible angioplasty.  DESCRIPTION OF PROCEDURE:  After obtaining the informed consent, the patient was brought to the catheterization lab and was placed on the fluoroscopy table.  The right groin was prepped and draped in the usual fashion. Xylocaine 2% was used for local anesthesia in the right groin.  With the help of a thin-walled needle, a 6 French arterial sheath was placed.  The sheath was aspirated and flushed.  Next, a 6 French left Judkins catheter was advanced over the wire under fluoroscopic guidance up the ascending aorta. The wire was pulled out.  The catheter was aspirated and connected to the manifold.  The catheter was further advanced and engaged into  the left coronary ostium.  Multiple views of the left system were taken.  Next, the catheter was disengaged and was pulled out over the wire and was replaced with 6 French right Judkins catheter, which was advanced over the wire under fluoroscopic guidance up the ascending aorta.  The wire was pulled out.  The catheter was aspirated and connected to the manifold.  The catheter was further advanced and engaged into right coronary ostium.  Multiple views of the right system were taken.  Next, the catheter was disengaged and was pulled out over the wire and was replaced with a 6 French pigtail catheter which was advanced over the wire under fluoroscopic guidance up the ascending aorta. The wire was pulled out.  The catheter was aspirated and connected to the manifold.  The catheter was further advanced across the aortic valve into the LV.  LV pressures were recorded.  Next, left ventriculography was done in 30 degree RAO position.  Post angiographic pressures were recorded from LV and then pullback pressures were recorded from aorta.  There was no gradient across the aortic valve.  Next, the pigtail catheter was pulled out over the wire and sheaths were aspirated and flushed.  FINDINGS:  The patient has good left ventricular systolic function.  The left main was short but was patent.  The LAD has 40% proximal lesion right at the origin of diagonal #1.  Diagonal #1 is very small but is patent.  Ramus is large, which is patent.  Left circumflex is small which is patent.  RCA is patent.  The patient tolerated the procedure well.  There were no complications. The patient was transferred to recovery room in stable condition. DD:  05/01/00 TD:  05/06/00 Job: [id number]

## 2011-05-03 NOTE — Discharge Summary (Signed)
NAME:  Anthony Skinner, Anthony Skinner                ACCOUNT NO.:  [id number]   MEDICAL RECORD NO.:  [id number]          PATIENT TYPE:  INP   LOCATION:  1443                         FACILITY[facility]  [facility]   PHYSICIAN:  Lucrezia Starch. Earlene Plater, M.D.  DATE OF BIRTH:  [birth year]   DATE OF ADMISSION:  10/05/2008  DATE OF DISCHARGE:  10/06/2008                               DISCHARGE SUMMARY   DIAGNOSIS:  Adenocarcinoma of prostate.   PROCEDURE:  Robotic assisted laparoscopic radical prostatectomy and  bilateral nerve-sparing, bilateral pelvic lymph node dissection on  October 05, 2008.   HISTORY AND PHYSICAL EXAMINATION:  Anthony Skinner is a very nice 70 year old  white male who presented with elevated PSA of 5.23.  Subsequently  underwent biopsy of the prostate which revealed multiple nodules  positive, 20% from the base, had Gleason score 8, 4+4 and 40%.  The  right mid had Gleason score 7, 3+4.  Metastatic workup was negative.  After understanding risks, benefits and alternatives, he elected to  proceed with the above procedure.  Past medical history, social history,  family history, and review of systems, please see history and physical  for full detail.   PHYSICAL EXAMINATION:  VITAL SIGNS:  He is afebrile.  Vital signs  stable.  GENERAL:  Well nourished, well developed, well groomed, oriented x3.  HEAD, EYE, EAR, NOSE, THROAT:  Normal.  NECK:  Without mass or thyromegaly.  CHEST:  Normal diaphragmatic motion.  ABDOMEN:  Soft, nontender without masses, organomegaly or hernia.  RECTAL:  The rectal vault empty.  Prostate, palpable nodule involving  the right base.  Appeared confined to the prostate.  The prostate was  not enlarged.  EXTREMITIES:  Normal.  NEURO:  Intact.   HOSPITAL COURSE:  The patient was admitted, underwent proper preop  evaluation, was subsequently taken to surgery on October 05, 2008 and  underwent above procedure.  Postoperatively he did well.  Initial  hemoglobin was 13.7 Medical was 41.2  and BMET was essentially normal.  Urine was clearing.  Blake drain output was low.  By October 06, 2008 he  had overall is doing well, and had negative flatus, had some soreness.  He began mobilizing and checks in the [place] drain output was quite low.  He was transferred to p.o. pain medications.  The drain was subsequently  removed and the patient was discharged that afternoon.  Medications were  Colace, Cipro and Vicodin.  Discharge condition was improved.  He was to  follow up the following week and discharge instructions were given.  Pathology was not available at that time but subsequently was found to  be a Gleason score 7, 4+3, a pathologic stage pT2C pN0, pMx.     Ronald L. Earlene Plater, M.D.  Electronically Signed    RLD/MEDQ  D:  10/23/2008  T:  10/23/2008  Job:  [id number]

## 2011-05-03 NOTE — Discharge Summary (Signed)
NAME:  Anthony Skinner, Anthony Skinner                          ACCOUNT NO.:  [id number]   MEDICAL RECORD NO.:  [id number]                   PATIENT TYPE:  INP   LOCATION:  4712                                 FACILITY:  [facility]   PHYSICIAN:  Robynn Pane, M.D.               DATE OF BIRTH:  10-17-1941   DATE OF ADMISSION:  08/21/2002  DATE OF DISCHARGE:  08/22/2002                                 DISCHARGE SUMMARY   ADMISSION DIAGNOSIS:  1. Chest pain rule out myocardial infarction, rule out  gastroesophageal     reflux disease, rule out  peptic ulcer disease.  2. Hypercholesterolemia.  3. History of gastroesophageal reflux disease.  4. Degenerative joint disease.  5. History of tobacco abuse.  6. Strong family history of coronary artery disease.   DISCHARGE DIAGNOSES:  1. Chest pain with negative Persantine Cardiolite, rule out     gastroesophageal reflux disease, rule out  peptic ulcer disease.  2. Hypercholesterolia.  3. Degenerative joint disease.  4. History of tobacco abuse.  5. Positive family history of coronary artery disease.   DISCHARGE ACTIVITIES:  1. Baby aspirin 81 mg one tab daily.  2. Nexium 40 mg one cap daily one half hour before breakfast.  3. Lipitor 10 mg one tab daily.  4. Nitrostat 0.4 mg SL.   ACTIVITY:  As tolerated.   DIET:  Low salt, low cholesterol.   FOLLOW UP:  With me in two weeks.   DISCHARGE CONDITION:  Stable.   HISTORY OF PRESENT ILLNESS:  Anthony Skinner is a 70 year old white male  with past medical history significant for hypercholesterolemia and  gastroesophageal reflux disease, tobacco, degenerative joint disease, and  positive family history of coronary artery disease. He came to the emergency  room via EMS complaining of retrosternal chest pain radiating to both arms  and associated with nausea and diaphoresis. He took two SL Nitro and  antacids while eating food with partial relief. He called EMS. Received two  more SL Nitro with relief  of chest pain from a 10/10 to 2/10. Denies any  palpitations or light headedness but felt dizzy and question of near  syncopal episode. Denies any exertional chest pain. Denies PND, orthopnea,  leg swelling, fever, chills, cough, or abdominal pain.   PAST MEDICAL HISTORY:  As above.   PAST SURGICAL HISTORY:  He had left lung lobectomy in 1966. Had left  catheterization in May of 2001 which showed 40% LAD stenosis.   ALLERGIES:  No known drug allergies.   ADMISSION MEDICATIONS:  1. Lipitor 10 mg po QD.  2. Nexium 40 mg po QD.  3. Naprosyn 5 mg twice a day.  4. __ sulfate 325 mg po QHS.   SOCIAL HISTORY:  He is married. Born in [place] and raised in [place]. He  lives in [place]. On disability. Worked for a Facilities manager. He  smoked two packs per day  for forty years. He quit 5 1/2 years ago. No  history of alcohol abuse.   FAMILY HISTORY:  Father died of myocardial infarction at the age of 88  years. Had a total of three myocardial infarctions with the first in his  80's. Mother died of Alzheimer's disease at the age of 44 years. One brother  is alive. He is 61 years and has had four myocardial infarctions with the  first myocardial infarction in his 70's. He is diabetic. One brother had  cancer of the jaw. One sister in good health.   PHYSICAL EXAMINATION:  GENERAL: He is alert and oriented times three.  VITAL SIGNS: Blood pressure 130/70, pulse 88.  HEENT: Conjunctiva pink.  NECK: Supple. No jugular venous distention. No jugular venous distention. or  bruit.  LUNGS: Clear to auscultation without rales.  CARDIAC: S1 and S2 is normal. There is no evidence of murmur. There was no  S3 or S4 gallop.  ABDOMEN: Soft. Bowel sounds present. There was mild epigastric tenderness.  There was no guarding.  EXTREMITIES: No clubbing, cyanosis, or edema.   DIAGNOSTIC STUDIES:  EKG showed normal sinus rhythm with no acute ischemic  changes. Persantine Cardiolite done on August 22, 2002 showed no evidence  of ischemia or scarring. Ejection fraction was 63%. Chest x-ray  showed low  lung volumes with atelectasias at bases and old granulomatous disease. No  acute infiltrates.   LABORATORY DATA:  C reactive protein was 0.1. Cholesterol was 158, HDL 54,  LDL 89. CPK, MB 1.3. Second set was 113 and MB of 1.7. Third set revealed CK  of 124 and MB of 1.5. Three sets of troponin I were 0.02, 0.02, and 0.02.  His sodium was 141, potassium 3.3, chloride 110, bicarb 25, glucose 90, BUN  13, creatinine 1.2. Liver enzymes were normal. Hemoglobin and hematocrit  13.5 and 40.1. WBC 5.8.   HOSPITAL COURSE:  The patient was admitted to telemetry and myocardial  infarction was ruled out by serial enzymes and EKG. The patient underwent  Persantine-Cardiolite on August 22, 2002 which showed no evidence of  reversible ischemia as above. The patient has not had any further episodes  of chest pain during the hospital stay. The patient was discharged on  multiple medications and will be followed up in my office in two weeks.                                               Robynn Pane, M.D.    MNH/MEDQ  D:  09/15/2002  T:  09/20/2002  Job:  [id number]

## 2011-05-04 LAB — CBC
HCT: 33.5 % — ABNORMAL LOW (ref 39.0–52.0)
Hemoglobin: 11.4 g/dL — ABNORMAL LOW (ref 13.0–17.0)
MCH: 29 pg (ref 26.0–34.0)
MCHC: 34 g/dL (ref 30.0–36.0)
MCV: 85.2 fL (ref 78.0–100.0)
Platelets: 219 10*3/uL (ref 150–400)
RBC: 3.93 MIL/uL — ABNORMAL LOW (ref 4.22–5.81)
RDW: 15 % (ref 11.5–15.5)
WBC: 7.4 10*3/uL (ref 4.0–10.5)

## 2011-05-04 LAB — TSH: TSH: 1.937 u[IU]/mL (ref 0.350–4.500)

## 2011-05-04 LAB — T4, FREE: Free T4: 1.02 ng/dL (ref 0.80–1.80)

## 2011-05-05 LAB — BASIC METABOLIC PANEL
BUN: 15 mg/dL (ref 6–23)
CO2: 27 mEq/L (ref 19–32)
Calcium: 9.3 mg/dL (ref 8.4–10.5)
Chloride: 104 mEq/L (ref 96–112)
Creatinine, Ser: 1.04 mg/dL (ref 0.4–1.5)
GFR calc Af Amer: >60 mL/min (ref >60)
GFR calc non Af Amer: >60 mL/min (ref >60)
Glucose, Bld: 96 mg/dL (ref 70–99)
Potassium: 3.8 mEq/L (ref 3.5–5.1)
Sodium: 140 mEq/L (ref 135–145)

## 2011-05-05 LAB — CBC
HCT: 35.9 % — ABNORMAL LOW (ref 39.0–52.0)
Hemoglobin: 12.1 g/dL — ABNORMAL LOW (ref 13.0–17.0)
MCH: 28.6 pg (ref 26.0–34.0)
MCHC: 33.7 g/dL (ref 30.0–36.0)
MCV: 84.9 fL (ref 78.0–100.0)
Platelets: 258 10*3/uL (ref 150–400)
RBC: 4.23 MIL/uL (ref 4.22–5.81)
RDW: 14.8 % (ref 11.5–15.5)
WBC: 6.5 10*3/uL (ref 4.0–10.5)

## 2011-05-06 DIAGNOSIS — R55 Syncope and collapse: Secondary | ICD-10-CM

## 2011-05-06 NOTE — H&P (Signed)
NAME:  Anthony, Skinner                ACCOUNT NO.:  [id number]  MEDICAL RECORD NO.:  [id number]           PATIENT TYPE:  I  LOCATION:  4731                         FACILITY:  [facility]  PHYSICIAN:  Anselmo Rod, MD, FACGDATE OF BIRTH:  Jun 22, 1941  DATE OF ADMISSION:  04/29/2011 DATE OF DISCHARGE:                             HISTORY & PHYSICAL   REASON FOR CONSULTATION:  Severe anemia with guaiac-positive stools and recurrent syncope.  ASSESSMENT: 1. Severe anemia with a hemoglobin of 6.7 gm/dL, guaiac-positive     stools and recurrent syncope in a 70 year old white male on     anticoagulants for vertebral stent. 2. Remote peptic ulcer disease, arteriovenous malformations.     gastritis, etcetera.  BUN is high at 29. 3. History of gastroesophageal reflux disease and hiatal hernia     treated in the past with Nexium.  The patient denies any reflux     symptoms at this time. 4. Mild constipation. 5. The patient is status post left vertebral artery stenting in     October of 2011 for 80% stenosis diagnosed by an MRA.  The     procedure was done by Julieanne Cotton, M.D. 6. History of adenocarcinoma of the prostate status post robotic-     assisted prostatectomy and bilateral pelvic adenectomy done in     October of 2009 by Dr. Gaynelle Arabian. 7. History of partial left lung lobectomy in the 1960s.  It was for a     correction of recurrent pneumothorax. 8. Degenerative joint disease. 9. Hypertension. 10.Hypercholesterolemia. 11.Remote history of tobacco use.  RECOMMENDATIONS: 1. Esophagogastroduodenoscopy tomorrow. 2. Serial CBCs. 3. Proton pump inhibitor of choice. 4. Colonoscopy if the esophagogastroduodenoscopy is unrevealing.  DISCUSSION:  [name] is a very pleasant 70 year old white male known to me since 2002 when he had an EGD and a colonoscopy.  EGD was done for epigastric pain.  He was found to have a small hiatal hernia and gastritis in the proximal half of  the stomach with no evidence of ulceration.  A colonoscopy done at the same time revealed small internal hemorrhoids with no masses or polyps.  However, there was a significant amount of residual stool in the colon at that time.  The patient has been fairly stable from a GI standpoint until 2 days ago when he had recurrent syncope both on May 13 and May 14 when he woke up in the morning.  This prompted him to come to the emergency room where he was found to have guaiac-positive stools with a hemoglobin of 6.7 gm/dL.  He denies any frank melena or hematochezia.  His appetite has been fair. His weight has been stable.  There is no history of ulcers, jaundice or colitis in the remote past.  He did have a small ulcer diagnosed in his 68s treated medically.  There is no known family history of stomach or colon cancer.  The patient has been on Plavix and aspirin since his vertebral stent was placed in October of last year.  These have been held since admission by Dr. Sharyn Lull.  The patient denies any nonsteroidal use  or abuse.  PAST MEDICAL HISTORY:  See list above.  ALLERGIES:  No known drug allergies.  MEDICATIONS IN THE HOSPITAL:  Morphine sulfate, pantoprazole.  MEDICATIONS AT HOME:  Are aspirin, Plavix and Crestor.  SOCIAL HISTORY:  The patient is married and lives [place]  He denies the use of alcohol, tobacco or drugs.  He has smoked in the past.  FAMILY HISTORY:  Noncontributory.  There is no family history of colon or stomach cancer.  REVIEW OF SYSTEMS: 1. Recurrent syncope. 2. There is no history of melena or hematochezia. 3. There is mild left upper quadrant tenderness on exam but the     patient denies any frank pain, nausea, vomiting, fever, chills or     rigors.  He has mild constipation but no other GI complaints at     this time.  GENERAL PHYSICAL EXAM:  The patient is a very cooperative, pleasant white male in no acute distress.   Temperature 98.9, blood pressure 121/62, pulse of 80 per minute, respiratory rate 18. HEENT:  Examination reveals atraumatic, normocephalic head.  Facial symmetry preserved.  Dentures in the upper jaw.  He has edentulous lower jaw. NECK:  Supple. CHEST:  Clear to auscultation.  S1, S2 regular.  Decreased breath sounds at the left lung base. ABDOMEN:  Soft and nondistended with multiple surgical scars from previous robotic surgery.  There is mild left upper quadrant tenderness on palpation with no guarding, rebound or rigidity.  No hepatosplenomegaly. RECTAL:  Examination was not done as he had one done in the emergency room.  LABORATORY EVALUATION:  Revealed a hemoglobin of 6.7 on admission with white count of 8.8 and platelets of 185,000.  BMET revealed a sodium of 137, potassium 3.6, chloride 106, CO2 of 26, glucose 29, BUN 29, creatinine 0.94 and calcium 8.6.  CK-MBs were normal.  Troponin-I was less than 0.3.  Lipid profile revealed a cholesterol of 113, triglycerides of 68, VLDL of 14 and LDL of 56.  Anemia panel revealed an iron of 78, B12 of 149 and a ferritin of 33.  CBC after two blood transfusions he received today revealed a hemoglobin of 8.7 with platelets of 139.  CT of the head without contrast showed a normal exam consistent with the patient's age.  PLANS:  As above.  Further recommendations will be made in followup.     Anselmo Rod, MD, [facility]     JNM/MEDQ  D:  04/30/2011  T:  04/30/2011  Job:  [id number]  cc:   Windy Fast L. Earlene Plater, M.D. [name], M.D. Sanjeev K. Corliss Skains, M.D.  Electronically Signed by Charna Elizabeth M.D. on 05/06/2011 04:48:49 PM

## 2011-05-07 ENCOUNTER — Other Ambulatory Visit: Payer: Self-pay | Admitting: Gastroenterology

## 2011-05-07 LAB — CBC
HCT: 32.7 % — ABNORMAL LOW (ref 39.0–52.0)
Hemoglobin: 11.1 g/dL — ABNORMAL LOW (ref 13.0–17.0)
MCH: 29.1 pg (ref 26.0–34.0)
MCHC: 33.9 g/dL (ref 30.0–36.0)
MCV: 85.8 fL (ref 78.0–100.0)
Platelets: 274 10*3/uL (ref 150–400)
RBC: 3.81 MIL/uL — ABNORMAL LOW (ref 4.22–5.81)
RDW: 14.3 % (ref 11.5–15.5)
WBC: 6.6 10*3/uL (ref 4.0–10.5)

## 2011-05-07 LAB — BASIC METABOLIC PANEL
BUN: 10 mg/dL (ref 6–23)
CO2: 30 mEq/L (ref 19–32)
Calcium: 8.4 mg/dL (ref 8.4–10.5)
Chloride: 105 mEq/L (ref 96–112)
Creatinine, Ser: 0.96 mg/dL (ref 0.4–1.5)
GFR calc Af Amer: >60 mL/min (ref >60)
GFR calc non Af Amer: >60 mL/min (ref >60)
Glucose, Bld: 100 mg/dL — ABNORMAL HIGH (ref 70–99)
Potassium: 3.5 mEq/L (ref 3.5–5.1)
Sodium: 140 mEq/L (ref 135–145)

## 2011-05-08 LAB — CBC
HCT: 35.9 % — ABNORMAL LOW (ref 39.0–52.0)
Hemoglobin: 11.9 g/dL — ABNORMAL LOW (ref 13.0–17.0)
MCH: 28.6 pg (ref 26.0–34.0)
MCHC: 33.1 g/dL (ref 30.0–36.0)
MCV: 86.3 fL (ref 78.0–100.0)
Platelets: 296 10*3/uL (ref 150–400)
RBC: 4.16 MIL/uL — ABNORMAL LOW (ref 4.22–5.81)
RDW: 14.2 % (ref 11.5–15.5)
WBC: 6.2 10*3/uL (ref 4.0–10.5)

## 2011-05-10 NOTE — Consult Note (Signed)
NAME:  Anthony Skinner, Anthony Skinner NO.:  [id number]  MEDICAL RECORD NO.:  [id number]           PATIENT TYPE:  LOCATION:                                 FACILITY:  PHYSICIAN:  Hillis Range, MD       DATE OF BIRTH:  [birth year]  DATE OF CONSULTATION: DATE OF DISCHARGE:                                CONSULTATION   REQUESTING PHYSICIAN:  Mohan N. Sharyn Lull, MD  REASON FOR CONSULTATION:  Bradycardia.  HISTORY OF PRESENT ILLNESS:  Anthony Skinner is a pleasant 70 year old gentleman with a history of cerebrovascular disease, status post recent stenting of the left vertebral artery, hypertension, and prior prostate cancer, who is admitted with profound anemia secondary to GI bleeding and symptoms of weakness, fatigue, dizziness and syncope.  The patient reports that over the past 2-3 weeks he has had progressive weakness and dizziness.  He states that predominantly upon standing or walking that he would become dizzy and weak.  He had an episode where he collapsed upon standing on May 04, 2011.  He reports that symptoms continued to progress and on Apr 29, 2011, he again had an episode of syncope.  He was brought to [facility] where he was found to be quite anemic with a hemoglobin of 6.7.  He required multiple units of packed red blood cells with significant improvement in his symptoms of weakness, fatigue and dizziness.  He has had no further syncope since that time. He has been evaluated by GI and their workup thus far has revealed multiple antral ulcers as well as duodenitis.  He has also been evaluated by Interventional Radiology, who feels that his recent carotid stent is stable.  While on telemetry, the patient has been observed to have nocturnal bradycardia with heart rates in the 40s and 50s.  He has had no prolonged pauses.  He has also had brief nocturnal junctional rhythm.  During the day, it appears that his heart rates are quite stable and predominantly recorded  to be in the 60s and 70s.  The patient reports that 2 months ago he had an episode of syncope and states that at that time he had been sitting in his chair for prolonged period instead of quickly to walk.  He reports that he became dizzy upon standing and collapsed.  He did not have full syncope, but did become weak.  His spouse lowered him back into his chair and he apparently recovered quite quickly.  He feels that for the past few months he has had some daytime sleepiness and fatigue.  He denies chest pain, palpitations, nausea, vomiting, orthopnea, PND, shortness of breath or other symptoms.  Presently, he is resting comfortably and is without complaint.  PAST MEDICAL HISTORY: 1. Cerebrovascular disease, status post left vertebral artery stenting     in October 2011 Dr. Corliss Skains. 2. Adenocarcinoma of the prostate, status post robotic assisted     prostatectomy and bilateral pelvic adenectomy in October 2009. 3. Partial left lung lobectomy in 1960s. 4. Hypertension. 5. Hyperlipidemia. 6. Remote history of tobacco. 7. Degenerative joint disease. 8. GERD. 9. Remote peptic ulcer  disease and AVMs. 10.Cataract.  MEDICATIONS:  Reviewed in the [facility].  ALLERGIES:  NO KNOWN DRUG ALLERGIES.  SOCIAL HISTORY:  The patient lives in [place].  He is retired.  He quit smoking 14 years ago and denies alcohol use.  FAMILY HISTORY:  Notable for prostate cancer.  REVIEW OF SYSTEMS:  All systems reviewed and negative except as outlined in the HPI above.  PHYSICAL EXAMINATION:  Telemetry reveals sinus rhythm with nocturnal bradycardia with heart rates in the 40s while sleeping with brief junctional rhythm.  There are no prolonged pauses or significant brady arrhythmias while awake. VITALS:  Blood pressure 120/64, heart rate 65, respirations 16, sats 98% on room air. GENERAL:  The patient is a well-appearing male in no acute distress.  He is alert and oriented x3. HEENT:  Normocephalic,  atraumatic.  Sclerae clear.  Conjunctivae pale. Oropharynx clear. NECK:  Supple.  JVP 8 cm. LUNGS:  Clear to auscultation bilaterally. HEART:  Regular rate and rhythm.  No murmurs, rubs or gallops. GI:  Soft, nontender, nondistended.  Positive bowel sounds. EXTREMITIES:  No clubbing, cyanosis, or edema. SKIN:  No ecchymoses or lacerations. MUSCULOSKELETAL:  No deformity or atrophy. PSYCH:  Euthymic mood.  Full affect.  EKG from May 01, 2011, reveals sinus rhythm at 65 beats per minute, otherwise normal.  LABS UPON ADMISSION:  Hematocrit 19, hemoglobin 6.7, white blood cell count 8.8, platelets 185.  Upon admission, BUN was 29, creatinine was 0.9.  At presently, hematocrit is 30, white blood cell count 6, and hemoglobin 10.  BUN 10, creatinine 0.9, potassium 3.4.  Chest x-ray from May 02, 2011, reveals possible hairline fracture of the anterior aspect of the left sixth rib.  Head CT from Apr 30, 2011, reveals no significant abnormality.  IMPRESSION:  Anthony Skinner is a pleasant 70 year old gentleman with the above comorbidities who was admitted with profound anemia and symptoms there of.  I think that is weakness, fatigue, dizziness and syncope can all be explained by his significant dehydration and profound anemia due to GI bleeding upon admission.  Though he did have an episode of syncope 2 months ago, this was also orthostatic in nature and unlikely related to a bradycardia.  I am not observed any daytime bradycardia of significant, but I do note that he did have nocturnal bradycardia.  I think that this is probably a normal finding for this patient and unlikely represents pathology.  It is not clear to me that he has any symptoms of bradycardia at this time.  I would recommend that we check a thyroid profile and also orthostatics.  I do not feel that any further electrophysiologic evaluation is required at this time.  I think that once his volume has been adequately replete and GI  bleeding has been completely controlled that he could be further assessed in the outpatient setting. I would be happy to see him further if he develop symptoms of bradycardia down the road.     Hillis Range, MD     JA/MEDQ  D:  05/03/2011  T:  05/04/2011  Job:  [id number]  cc:   [name], M.D.  Electronically Signed by Hillis Range MD on 05/10/2011 05:55:49 PM

## 2011-06-06 NOTE — Discharge Summary (Signed)
NAME:  Anthony Skinner, Anthony Skinner                ACCOUNT NO.:  [id number]  MEDICAL RECORD NO.:  [id number]           PATIENT TYPE:  I  LOCATION:  4738                         FACILITY:  [facility]  PHYSICIAN:  Khloie Hamada N. Sharyn Lull, M.D. DATE OF BIRTH:  Sep 09, 1941  DATE OF ADMISSION:  04/29/2011 DATE OF DISCHARGE:  05/08/2011                              DISCHARGE SUMMARY   ADMITTING DIAGNOSES: 1. Acute upper gastrointestinal bleeding. 2. Status post syncope secondary to above. 3. Cerebrovascular disease status post left vertebral artery stenting     in the past. 4. Borderline hypertension. 5. Hypercholesteremia. 6. Remote history of tobacco abuse. 7. Gastroesophageal reflux disease. 8. Degenerative joint disease. 9. History of carcinoma of the prostate.  DISCHARGE DIAGNOSES: 1. Status post acute upper GI bleeding secondary to antral gastritis. 2. Status post syncope secondary to above. 3. Status post orthostatic hypotension. 4. Cerebrovascular disease status post left vertebral artery stenting     in the past. 5. Status post four-vessel cerebral angiogram. 6. Borderline hypertension. 7. Hypercholesteremia. 8. Remote tobacco abuse. 9. Gastroesophageal reflux disease. 10.Degenerative joint disease. 11.History of carcinoma of prostate. 12.Anemia. 13.Status post marked sinus bradycardia. 14.Questionable hairline fracture of the sixth left rib secondary to     fall.  DISCHARGE HOME MEDICATIONS: 1. Enteric-coated aspirin 81 mg 1 tablet daily. 2. Protonix 40 mg 1 tablet twice daily. 3. Carafate 1 g suspension 4 times daily. 4. Crestor 10 mg 1 tablet daily.  DIET:  Low salt, low cholesterol.  ACTIVITY:  Increase activity slowly as tolerated.  Follow up with me in 1 week and Dr. Loreta Ave in 2 weeks.  The patient has been advised if he notices any black tarry stools or feels dizzy, lightheaded, call EMS.  CONDITION AT DISCHARGE:  Stable.  BRIEF HISTORY AND HOSPITAL COURSE:  Anthony Skinner is a  70 year old white male with past medical history significant for borderline hypertension, hypercholesteremia, GERD, degenerative joint disease, remote tobacco abuse, history of syncope in the past, cerebrovascular disease status post left vertebral artery stenting.  He came to the ER because of recurrent syncopal episode associated with feeling weak, dizzy, lightheaded, especially after walking from bedroom to the bathroom and was noted to have hemoglobin of 6.7 in the ED with heme-positive stools. The patient states he has noticed dark stool for approximately 2 weeks. Also complained of vague chest pain during the episode.  Denies any history of PND, orthopnea, or leg swelling.  Denies weakness in the arms or legs.  Denies any slurred speech or blurring of vision.  PAST MEDICAL HISTORY:  As above plus history of CA of prostate.  PAST SURGICAL HISTORY:  He had left partial lobectomy.  He also had stenting of the left vertebral artery.  ALLERGIES:  No known drug allergies.  MEDICATION AT HOME:  He is on aspirin, Plavix, Crestor.  SOCIAL HISTORY:  He is married, retired, worked in Museum/gallery curator in the past.  Smoked two packs per day for 40+ years, quit 14 years ago. Used to drink socially.  FAMILY HISTORY:  Father died of MI in his 90s.  He had first MI at the age of  4.  Mother died of Alzheimer dementia.  One brother died of cancer.  PHYSICAL EXAMINATION:  GENERAL:  He is alert, awake, and oriented x3 in no acute distress. VITAL SIGNS:  Blood pressure was 139/60, pulse was 84, afebrile. HEENT:  Conjunctivae was pink. NECK:  Supple, no JVD, no bruit. LUNGS:  Clear to auscultation without rhonchi or rales. CARDIOVASCULAR:  S1 and S2 was normal.  There was soft systolic murmur. ABDOMEN:  Soft.  Bowel sounds were present.  There was mild epigastric tenderness and left lower quadrant tenderness. EXTREMITIES:  There is no clubbing, cyanosis, or edema.  LABORATORY DATA:  His  hemoglobin was 6.7, hematocrit 19.1, white count of 8.8.  BUN was 29, creatinine 0.94, glucose 129, potassium 3.6.  Stool for occult blood was positive.  His cholesterol was 113, LDL 56, HDL 43. Repeat hemoglobin post transfusion was 8.4, hematocrit 24.2, white count of 6.3.  The patient did require further 2 more units of packed RBCs. On May 03, 2011, hemoglobin was 10.3, hematocrit 30.2, white count of 6.0.  His TSH was 1.937.  His hemoglobin today is 11.9, hematocrit 35.9, white count of 6.2.  BRIEF HOSPITAL COURSE:  The patient was admitted to telemetry unit.  The patient received total of 4 packed RBCs.  The patient subsequently underwent upper endoscopy and colonoscopy as per procedure report.  The patient was noted to have multiple superficial gastric ulcers and was started on Carafate in addition to proton pump inhibitors.  The patient also subsequently underwent four-vessel cerebral angiogram, which showed approximately 30% left vertebral artery intrastent restenosis.  The patient subsequently underwent colonoscopy as the patient had significant drop of hemoglobin from above 12 g to 6.7 g.  Colonoscopy revealed polyps requiring polypectomy.  The patient's hemoglobin has been stable.  The patient has been ambulating in hallway without any episodes of dizziness or orthostatic hypotension.  The patient will be discharged home on above medications and will be followed up in my office in 1 week and with GI bleed.  The patient has been advised to stop Plavix and take baby aspirin 81 mg daily.  If he notices any black tarry stool or feels dizzy, weak, or further syncopal episode, should call 911 and go to the ER.     [name], M.D.     MNH/MEDQ  D:  05/08/2011  T:  05/09/2011  Job:  [id number]  Electronically Signed by Rinaldo Cloud M.D. on 06/06/2011 09:36:21 AM

## 2011-07-07 ENCOUNTER — Emergency Department (HOSPITAL_COMMUNITY): Payer: Medicare Other

## 2011-07-07 ENCOUNTER — Observation Stay (HOSPITAL_COMMUNITY)
Admission: EM | Admit: 2011-07-07 | Discharge: 2011-07-09 | Disposition: A | Discharge status: Home / Self Care | Payer: Medicare Other | Attending: Cardiology | Admitting: Cardiology

## 2011-07-07 ENCOUNTER — Observation Stay (HOSPITAL_COMMUNITY): Payer: Medicare Other

## 2011-07-07 DIAGNOSIS — E7800 Pure hypercholesterolemia, unspecified: Secondary | ICD-10-CM | POA: Insufficient documentation

## 2011-07-07 DIAGNOSIS — I252 Old myocardial infarction: Secondary | ICD-10-CM | POA: Insufficient documentation

## 2011-07-07 DIAGNOSIS — K2990 Gastroduodenitis, unspecified, without bleeding: Secondary | ICD-10-CM | POA: Insufficient documentation

## 2011-07-07 DIAGNOSIS — Z7982 Long term (current) use of aspirin: Secondary | ICD-10-CM | POA: Insufficient documentation

## 2011-07-07 DIAGNOSIS — M1990 Unspecified osteoarthritis, unspecified site: Secondary | ICD-10-CM | POA: Insufficient documentation

## 2011-07-07 DIAGNOSIS — K219 Gastro-esophageal reflux disease without esophagitis: Secondary | ICD-10-CM | POA: Insufficient documentation

## 2011-07-07 DIAGNOSIS — Z79899 Other long term (current) drug therapy: Secondary | ICD-10-CM | POA: Insufficient documentation

## 2011-07-07 DIAGNOSIS — R55 Syncope and collapse: Principal | ICD-10-CM | POA: Insufficient documentation

## 2011-07-07 DIAGNOSIS — K2970 Gastritis, unspecified, without bleeding: Secondary | ICD-10-CM | POA: Insufficient documentation

## 2011-07-07 DIAGNOSIS — Z8546 Personal history of malignant neoplasm of prostate: Secondary | ICD-10-CM | POA: Insufficient documentation

## 2011-07-07 LAB — PROTIME-INR
INR: 1.18 (ref 0.00–1.49)
Prothrombin Time: 15.2 seconds (ref 11.6–15.2)

## 2011-07-07 LAB — DIFFERENTIAL
Basophils Absolute: 0 10*3/uL (ref 0.0–0.1)
Basophils Relative: 1 % (ref 0–1)
Eosinophils Absolute: 0.1 10*3/uL (ref 0.0–0.7)
Eosinophils Relative: 2 % (ref 0–5)
Lymphocytes Relative: 20 % (ref 12–46)
Lymphs Abs: 1.1 10*3/uL (ref 0.7–4.0)
Monocytes Absolute: 0.6 10*3/uL (ref 0.1–1.0)
Monocytes Relative: 10 % (ref 3–12)
Neutro Abs: 3.7 10*3/uL (ref 1.7–7.7)
Neutrophils Relative %: 67 % (ref 43–77)

## 2011-07-07 LAB — URINALYSIS, ROUTINE W REFLEX MICROSCOPIC
Bilirubin Urine: NEGATIVE
Glucose, UA: NEGATIVE mg/dL
Hgb urine dipstick: NEGATIVE
Ketones, ur: NEGATIVE mg/dL
Leukocytes, UA: NEGATIVE
Nitrite: NEGATIVE
Protein, ur: NEGATIVE mg/dL
Specific Gravity, Urine: 1.009 (ref 1.005–1.030)
Urobilinogen, UA: 0.2 mg/dL (ref 0.0–1.0)
pH: 7 (ref 5.0–8.0)

## 2011-07-07 LAB — COMPREHENSIVE METABOLIC PANEL
ALT: 12 U/L (ref 0–53)
AST: 18 U/L (ref 0–37)
Albumin: 3.6 g/dL (ref 3.5–5.2)
Alkaline Phosphatase: 70 U/L (ref 39–117)
BUN: 15 mg/dL (ref 6–23)
CO2: 27 mEq/L (ref 19–32)
Calcium: 9.3 mg/dL (ref 8.4–10.5)
Chloride: 102 mEq/L (ref 96–112)
Creatinine, Ser: 1.06 mg/dL (ref 0.50–1.35)
GFR calc Af Amer: >60 mL/min (ref >60)
GFR calc non Af Amer: >60 mL/min (ref >60)
Glucose, Bld: 97 mg/dL (ref 70–99)
Potassium: 3.7 mEq/L (ref 3.5–5.1)
Sodium: 138 mEq/L (ref 135–145)
Total Bilirubin: 0.3 mg/dL (ref 0.3–1.2)
Total Protein: 7.3 g/dL (ref 6.0–8.3)

## 2011-07-07 LAB — CBC
HCT: 39.7 % (ref 39.0–52.0)
Hemoglobin: 13.5 g/dL (ref 13.0–17.0)
MCH: 27.8 pg (ref 26.0–34.0)
MCHC: 34 g/dL (ref 30.0–36.0)
MCV: 81.7 fL (ref 78.0–100.0)
Platelets: 225 10*3/uL (ref 150–400)
RBC: 4.86 MIL/uL (ref 4.22–5.81)
RDW: 13.6 % (ref 11.5–15.5)
WBC: 5.5 10*3/uL (ref 4.0–10.5)

## 2011-07-07 LAB — TROPONIN I: Troponin I: <0.3 ng/mL (ref <0.30)

## 2011-07-07 LAB — OCCULT BLOOD, POC DEVICE: Fecal Occult Bld: NEGATIVE

## 2011-07-07 LAB — APTT: aPTT: 34 seconds (ref 24–37)

## 2011-07-07 LAB — CK TOTAL AND CKMB (NOT AT ARMC)
CK, MB: 1.8 ng/mL (ref 0.3–4.0)
Relative Index: INVALID (ref 0.0–2.5)
Total CK: 85 U/L (ref 7–232)

## 2011-07-07 MED ORDER — GADOBENATE DIMEGLUMINE 529 MG/ML IV SOLN: 18.0000 mL | Freq: Once | INTRAVENOUS | Status: AC | PRN

## 2011-07-08 LAB — BASIC METABOLIC PANEL
BUN: 12 mg/dL (ref 6–23)
CO2: 26 mEq/L (ref 19–32)
Calcium: 9.1 mg/dL (ref 8.4–10.5)
Chloride: 106 mEq/L (ref 96–112)
Creatinine, Ser: 0.98 mg/dL (ref 0.50–1.35)
GFR calc Af Amer: >60 mL/min (ref >60)
GFR calc non Af Amer: >60 mL/min (ref >60)
Glucose, Bld: 91 mg/dL (ref 70–99)
Potassium: 3.7 mEq/L (ref 3.5–5.1)
Sodium: 141 mEq/L (ref 135–145)

## 2011-07-08 LAB — LIPID PANEL
Cholesterol: 125 mg/dL (ref 0–200)
HDL: 60 mg/dL (ref >39)
LDL Cholesterol: 51 mg/dL (ref 0–99)
Total CHOL/HDL Ratio: 2.1 RATIO
Triglycerides: 70 mg/dL (ref <150)
VLDL: 14 mg/dL (ref 0–40)

## 2011-07-08 LAB — CBC
HCT: 37.5 % — ABNORMAL LOW (ref 39.0–52.0)
Hemoglobin: 12.4 g/dL — ABNORMAL LOW (ref 13.0–17.0)
MCH: 27.2 pg (ref 26.0–34.0)
MCHC: 33.1 g/dL (ref 30.0–36.0)
MCV: 82.2 fL (ref 78.0–100.0)
Platelets: 217 10*3/uL (ref 150–400)
RBC: 4.56 MIL/uL (ref 4.22–5.81)
RDW: 13.9 % (ref 11.5–15.5)
WBC: 4.5 10*3/uL (ref 4.0–10.5)

## 2011-08-01 NOTE — Discharge Summary (Signed)
NAME:  Anthony Skinner, Anthony Skinner NO.:  [id number]  MEDICAL RECORD NO.:  [id number]  LOCATION:  2006                         FACILITY:  [facility]  PHYSICIAN:  [name], M.D. DATE OF BIRTH:  07/31/1941  DATE OF ADMISSION:  07/07/2011 DATE OF DISCHARGE:  07/09/2011                              DISCHARGE SUMMARY   ADMITTING DIAGNOSES: 1. Syncope. 2. Positional vertigo.  FINAL DIAGNOSES: 1. Status post syncope, probably secondary to orthostatic hypotension. 2. Status post vertigo. 3. Cerebrovascular disease status post left vertebral artery stenting     in the past which is patent. 4. Status post recent acute upper gastrointestinal bleeding secondary     to antral gastritis which is stable. 5. Hypercholesteremia. 6. Remote tobacco abuse. 7. Gastroesophageal reflux disease. 8. Degenerative joint disease. 9. History of cancer of prostate. 10.History of questionable hairline fracture of the sixth left rib     secondary to fall in the past.  DISCHARGE HOME MEDICATIONS: 1. Enteric-coated aspirin 325 mg 1 tablet daily. 2. Meclizine 25 mg 1 tablet 3 times daily. 3. Crestor 10 mg 1 tablet every day. 4. Omeprazole 20 mg 1 capsule twice daily.  DIET:  Low salt, low cholesterol.  The patient has been advised to drink plenty of fluids.  The patient has been advised to refrain from suddenly getting out of bed.  ACTIVITY:  As tolerated.  CONDITION AT DISCHARGE:  Stable.  FOLLOWUP:  Follow up with me in 1 week.  BRIEF HISTORY AND HOSPITAL COURSE:  Anthony Skinner is a 70 year old white male with past medical history significant for hypercholesteremia, GERD, borderline hypertension, controlled by diet, degenerative joint disease, remote tobacco abuse, history of syncope in the past, cerebrovascular disease status post left vertebral artery stenting in the past, was admitted by Dr. Algie Coffer on July 07, 2011, because of syncopal episode after getting out of bed.  The patient  denies any palpitation, no chest pain or shortness of breath, felt dizzy after change in position and passed out.  PAST MEDICAL HISTORY:  As above.  SOCIAL HISTORY:  He is married, retired, used to smoke 2 packs per day for 40 plus years, quit 14 years ago.  Used to drink socially.  He worked in the [employer] in the past.  ALLERGIES:  No known drug allergies.  MEDICATION AT HOME:  He was on aspirin, Crestor, and omeprazole.  PHYSICAL EXAMINATION:  GENERAL:  He was alert, awake, and oriented x3. VITAL SIGNS:  Blood pressure was 135/58, pulse was 58, afebrile, respirations 18. HEENT:  Conjunctivae were pink. NECK:  Supple, no JVD. LUNGS:  Clear to auscultation without rhonchi or rales. CARDIOVASCULAR:  S1 and S2 was normal. ABDOMEN:  Soft. EXTREMITIES:  There was no clubbing, cyanosis, or edema. NEUROLOGIC:  Grossly intact except for positional vertigo.  LABS:  Hemoglobin was 13.5, hematocrit 39.7, white count of 5.5.  His PT was 15.2, INR 1.18, troponin I was less than 0.30, CPK was 85, MB 1.8 which were negative.  Sodium was 138, potassium 3.7, glucose 97, BUN 15, creatinine 1.06.  Stool for occult blood was negative.  Repeat CBC, hemoglobin was 12.4, hematocrit 37.5, white count of 4.5.  His  sodium was 141, potassium 3.7, BUN 12, creatinine 0.98, glucose 91. Cholesterol was 125, LDL 51, HDL 60, triglycerides were 70.  The patient also had CT of the brain which was unremarkable.  Unchanged CT head without contrast.  The patient subsequently underwent MRI, MRA of the brain with and without contrast which showed stable mild atrophy and no acute stroke or abnormal postcontrast enhancement, no significant changes from the prior MRI.  The patient has undergone endovascular stenting of the left vertebral artery.  Based on the flow wise signal, left vertebral artery is widely patent at the present exam.  BRIEF HOSPITAL COURSE:  The patient was admitted to telemetry unit.   The patient was started on meclizine.  The patient did not have any further episodes of vertigo, dizziness, or syncopal episode.  The patient has been ambulating in hallway without any problems.  The patient did not have any episodes of significant tachyarrhythmias or bradyarrhythmias. The patient had also 2D echo which showed normal LV systolic function with no evidence of aortic stenosis or any significant valvular heart disease with good LV systolic function.  The patient will be discharged home on above medications and will be followed up in my office in 1 week.     [name], M.D.     [facility]  D:  07/09/2011  T:  07/09/2011  Job:  [id number]  Electronically Signed by Rinaldo Cloud M.D. on 08/01/2011 08:05:42 PM

## 2011-08-09 ENCOUNTER — Other Ambulatory Visit (HOSPITAL_COMMUNITY): Payer: Self-pay | Admitting: Interventional Radiology

## 2011-08-09 DIAGNOSIS — I771 Stricture of artery: Secondary | ICD-10-CM

## 2011-08-26 ENCOUNTER — Ambulatory Visit (HOSPITAL_COMMUNITY)
Admission: RE | Admit: 2011-08-26 | Discharge: 2011-08-26 | Payer: Medicare Other | Source: Ambulatory Visit | Attending: Interventional Radiology | Admitting: Interventional Radiology

## 2011-09-17 LAB — BASIC METABOLIC PANEL
BUN: 6
BUN: 9
CO2: 26
CO2: 30
Calcium: 8.1 — ABNORMAL LOW
Calcium: 8.4
Chloride: 105
Chloride: 108
Creatinine, Ser: 1.05
Creatinine, Ser: 1.12
GFR calc Af Amer: >60
GFR calc Af Amer: >60
GFR calc non Af Amer: >60
GFR calc non Af Amer: >60
Glucose, Bld: 114 — ABNORMAL HIGH
Glucose, Bld: 124 — ABNORMAL HIGH
Potassium: 4
Potassium: 4.3
Sodium: 138
Sodium: 139

## 2011-09-17 LAB — CBC
HCT: 40.4
HCT: 41.2
HCT: 45.6
Hemoglobin: 13.4
Hemoglobin: 13.7
Hemoglobin: 15.2
MCHC: 33.1
MCHC: 33.2
MCHC: 33.3
MCV: 85.6
MCV: 86.5
MCV: 86.7
Platelets: 203
Platelets: 234
Platelets: 238
RBC: 4.66
RBC: 4.76
RBC: 5.33
RDW: 14.7
RDW: 14.9
RDW: 15.1
WBC: 14.1 — ABNORMAL HIGH
WBC: 4.6
WBC: 6.9

## 2011-09-17 LAB — COMPREHENSIVE METABOLIC PANEL
ALT: 20
AST: 22
Albumin: 4.1
Alkaline Phosphatase: 67
BUN: 12
CO2: 29
Calcium: 9.6
Chloride: 104
Creatinine, Ser: 1.12
GFR calc Af Amer: >60
GFR calc non Af Amer: >60
Glucose, Bld: 109 — ABNORMAL HIGH
Potassium: 4.3
Sodium: 140
Total Bilirubin: 0.7
Total Protein: 7.3

## 2011-09-17 LAB — DIFFERENTIAL
Basophils Absolute: 0
Basophils Absolute: 0
Basophils Relative: 0
Basophils Relative: 0
Eosinophils Absolute: 0
Eosinophils Absolute: 0.1
Eosinophils Relative: 1
Eosinophils Relative: 1
Lymphocytes Relative: 17
Lymphocytes Relative: 22
Lymphs Abs: 1.5
Lymphs Abs: 2.3
Monocytes Absolute: 0.7
Monocytes Absolute: 0.8
Monocytes Relative: 10
Monocytes Relative: 6
Neutro Abs: 10.8 — ABNORMAL HIGH
Neutro Abs: 4.7
Neutrophils Relative %: 68
Neutrophils Relative %: 77

## 2011-09-17 LAB — URINALYSIS, ROUTINE W REFLEX MICROSCOPIC
Bilirubin Urine: NEGATIVE
Glucose, UA: NEGATIVE
Hgb urine dipstick: NEGATIVE
Ketones, ur: NEGATIVE
Nitrite: NEGATIVE
Protein, ur: NEGATIVE
Specific Gravity, Urine: 1.01
Urobilinogen, UA: 0.2
pH: 6

## 2011-09-17 LAB — ABO/RH: ABO/RH(D): A POS

## 2011-09-17 LAB — TYPE AND SCREEN
ABO/RH(D): A POS
Antibody Screen: NEGATIVE

## 2011-09-17 LAB — PROTIME-INR
INR: 1
Prothrombin Time: 13.4

## 2011-09-17 LAB — APTT: aPTT: 31

## 2011-10-07 ENCOUNTER — Other Ambulatory Visit (HOSPITAL_COMMUNITY): Payer: Self-pay | Admitting: Interventional Radiology

## 2011-10-07 DIAGNOSIS — I771 Stricture of artery: Secondary | ICD-10-CM

## 2011-10-15 ENCOUNTER — Other Ambulatory Visit (HOSPITAL_COMMUNITY): Payer: Medicare Other

## 2012-10-21 ENCOUNTER — Telehealth: Payer: Self-pay | Admitting: Internal Medicine

## 2012-10-21 NOTE — Telephone Encounter (Signed)
Left a message for patient's wife to call me.(hx with Dr. Loreta Ave?)

## 2012-10-22 NOTE — Telephone Encounter (Signed)
Spoke with patient's wife and he is going to see his urologist because his PSA is up and he has hx prostate ca.

## 2012-10-22 NOTE — Telephone Encounter (Signed)
Unable to reach patient. Phone rings but no answer. Will try again later. 

## 2012-10-29 ENCOUNTER — Encounter: Payer: Self-pay | Admitting: Internal Medicine

## 2012-11-27 ENCOUNTER — Encounter: Payer: Self-pay | Admitting: *Deleted

## 2012-12-30 ENCOUNTER — Other Ambulatory Visit (INDEPENDENT_AMBULATORY_CARE_PROVIDER_SITE_OTHER): Payer: Medicare Other

## 2012-12-30 ENCOUNTER — Encounter: Payer: Self-pay | Admitting: Internal Medicine

## 2012-12-30 ENCOUNTER — Ambulatory Visit (INDEPENDENT_AMBULATORY_CARE_PROVIDER_SITE_OTHER): Payer: Medicare Other | Admitting: Internal Medicine

## 2012-12-30 VITALS — BP 136/60 | HR 68 | Ht 71.25 in | Wt 149.5 lb

## 2012-12-30 DIAGNOSIS — R05 Cough: Secondary | ICD-10-CM

## 2012-12-30 DIAGNOSIS — R6889 Other general symptoms and signs: Secondary | ICD-10-CM

## 2012-12-30 DIAGNOSIS — R0602 Shortness of breath: Secondary | ICD-10-CM

## 2012-12-30 DIAGNOSIS — R634 Abnormal weight loss: Secondary | ICD-10-CM

## 2012-12-30 DIAGNOSIS — C61 Malignant neoplasm of prostate: Secondary | ICD-10-CM

## 2012-12-30 DIAGNOSIS — R059 Cough, unspecified: Secondary | ICD-10-CM

## 2012-12-30 DIAGNOSIS — K259 Gastric ulcer, unspecified as acute or chronic, without hemorrhage or perforation: Secondary | ICD-10-CM

## 2012-12-30 LAB — COMPREHENSIVE METABOLIC PANEL
ALT: 14 U/L (ref 0–53)
AST: 19 U/L (ref 0–37)
Albumin: 4.2 g/dL (ref 3.5–5.2)
Alkaline Phosphatase: 67 U/L (ref 39–117)
BUN: 12 mg/dL (ref 6–23)
CO2: 30 mEq/L (ref 19–32)
Calcium: 9.5 mg/dL (ref 8.4–10.5)
Chloride: 103 mEq/L (ref 96–112)
Creatinine, Ser: 1 mg/dL (ref 0.4–1.5)
GFR: 75.98 mL/min (ref >60.00)
Glucose, Bld: 96 mg/dL (ref 70–99)
Potassium: 4.5 mEq/L (ref 3.5–5.1)
Sodium: 139 mEq/L (ref 135–145)
Total Bilirubin: 0.7 mg/dL (ref 0.3–1.2)
Total Protein: 7.4 g/dL (ref 6.0–8.3)

## 2012-12-30 LAB — TSH: TSH: 1.44 u[IU]/mL (ref 0.35–5.50)

## 2012-12-30 LAB — VITAMIN B12: Vitamin B-12: 294 pg/mL (ref 211–911)

## 2012-12-30 NOTE — Patient Instructions (Addendum)
Your physician has requested that you go to the basement for the following lab work before leaving today: TSH, CMET, B12, Celiac 10   You have been scheduled for a CT scan of the chest, abdomen and pelvis at [facility] ([address]---this is in the same building as Architectural technologist).   You are scheduled on Friday 01/01/13 at 2:00 pm. You should arrive 15 minutes prior to your appointment time for registration. Please follow the written instructions below on the day of your exam:  WARNING: IF YOU ARE ALLERGIC TO IODINE/X-RAY DYE, PLEASE NOTIFY RADIOLOGY IMMEDIATELY AT [phone]! YOU WILL BE GIVEN A 13 HOUR PREMEDICATION PREP.  1) Do not eat or drink anything after 10:00 am (4 hours prior to your test) 2) You have been given 2 bottles of oral contrast to drink. The solution may taste better if refrigerated, but do NOT add ice or any other liquid to this solution. Shake well before drinking.    Drink 1 bottle of contrast @ 12:00 pm (2 hours prior to your exam)  Drink 1 bottle of contrast @ 1:00 pm(1 hour prior to your exam)  You may take any medications as prescribed with a small amount of water except for the following: Metformin, Glucophage, Glucovance, Avandamet, Riomet, Fortamet, Actoplus Met, Janumet, Glumetza or Metaglip. The above medications must be held the day of the exam AND 48 hours after the exam.  The purpose of you drinking the oral contrast is to aid in the visualization of your intestinal tract. The contrast solution may cause some diarrhea. Before your exam is started, you will be given a small amount of fluid to drink. Depending on your individual set of symptoms, you may also receive an intravenous injection of x-ray contrast/dye. Plan on being at [facility] for 30 minutes or long, depending on the type of exam you are having performed.  This test typically takes 30-45 minutes to complete.  If you have any questions regarding your exam or if you  need to reschedule, you may call the CT department at [phone] between the hours of 8:00 am and 5:00 pm, Monday-Friday.  ________________________________________________________________________ CC: Dr Rinaldo Cloud

## 2012-12-30 NOTE — Progress Notes (Signed)
Anthony Skinner 1941-10-28 MRN [id number]  History of Present Illness:  This is a 72 year old white male with weight loss from 184 pounds 3 years ago to a current 149 pounds. His wife says that patient eats a lot and his appetite is excellent. Patient denies any specific symptoms other than recent cough, mild shortness of breath and mild constipation. He had an acute upper GI bleed causing a syncopal episode in May 2012 attributed to multiple gastric erosions from aspirin (Dr Loreta Ave). He was on Coumadin for vertebral stent but he is no longer on it. He has a history of remote peptic ulcer disease while in service, gastroesophageal reflux, hiatal hernia and B12 deficiency. He has not been on B12 supplements. His last colonoscopy in 2002 and most recently in May 2012 showed 2 adenomatous polyps. He denies night sweats. He is a 40 plus year smoker. He had prostate cancer in 2010, currently followed by Dr.Ottelin.   Past Medical History  Diagnosis Date  . Iron deficiency anemia   . Tubular adenoma of colon 2012  . Gastropathy 2012    reactive  . PUD (peptic ulcer disease)   . Prostate cancer   . Heart attack   . Hyperlipidemia   . AVM (arteriovenous malformation) brain   . GERD (gastroesophageal reflux disease)   . Hiatal hernia   . Recurrent spontaneous pneumothorax   . DJD (degenerative joint disease)   . Hypertension   . Upper GI bleed    Past Surgical History  Procedure Date  . Prostatectomy   . Vertebral artery stent   . Lung removal, partial 1960s    left    reports that he quit smoking about 15 years ago. His smoking use included Cigarettes. He has never used smokeless tobacco. He reports that he does not drink alcohol or use illicit drugs. family history includes Alzheimer's disease in his mother; Heart attack in his father; and Prostate cancer in his brother. No Known Allergies      Review of Systems: Denies nausea vomiting dysphagia diarrhea  The remainder of the 10 point  ROS is negative except as outlined in H&P   Physical Exam: General appearance  Well developed, in no distress. thin Eyes- non icteric. HEENT nontraumatic, normocephalic. Mouth no lesions, tongue papillated, no cheilosis. Neck supple without adenopathy, thyroid not enlarged, no carotid bruits, no JVD. Lungs Clear to auscultation bilaterally. Cor normal S1, normal S2, regular rhythm, no murmur,  quiet precordium. Abdomen: Scaphoid soft nontender with normoactive bowel sounds. No distention. Voluntary guarding. Liver edge at costal margin. No ascites. Rectal: Moderate amount of soft Hemoccult negative stool. Extremities no pedal edema. Skin no lesions. Neurological alert and oriented x 3. Psychological normal mood and affect.  Assessment and Plan:  Problem #50 72 year old white male with an impressive weight loss of 30 pounds over the past 3 years without signs of focal disease. He is Hemoccult negative and reports good eating habits. We will check his serum protein and liver function tests as well as sprue profile to rule out malabsorption. We will also check his TSH to rule out metabolic causes of weight loss. We will obtain a CT scan of the abdomen and pelvis and CT scan of the chest because of his recent symptoms of shortness of breath, cough and history heavy smoking. Depending on the results, we will make a final disposition.   12/30/2012 Lina Sar

## 2012-12-31 LAB — CELIAC PANEL 10
Endomysial Screen: NEGATIVE
Gliadin IgA: 4.7 U/mL (ref <20)
Gliadin IgG: 6.7 U/mL (ref <20)
IgA: 306 mg/dL (ref 68–379)
Tissue Transglut Ab: 5.9 U/mL (ref <20)
Tissue Transglutaminase Ab, IgA: 3.9 U/mL (ref <20)

## 2013-01-01 ENCOUNTER — Ambulatory Visit (INDEPENDENT_AMBULATORY_CARE_PROVIDER_SITE_OTHER)
Admission: RE | Admit: 2013-01-01 | Discharge: 2013-01-01 | Disposition: A | Discharge status: Home / Self Care | Payer: Medicare Other | Source: Ambulatory Visit | Attending: Internal Medicine | Admitting: Internal Medicine

## 2013-01-01 DIAGNOSIS — K259 Gastric ulcer, unspecified as acute or chronic, without hemorrhage or perforation: Secondary | ICD-10-CM

## 2013-01-01 DIAGNOSIS — R059 Cough, unspecified: Secondary | ICD-10-CM

## 2013-01-01 DIAGNOSIS — R05 Cough: Secondary | ICD-10-CM

## 2013-01-01 DIAGNOSIS — C61 Malignant neoplasm of prostate: Secondary | ICD-10-CM

## 2013-01-01 DIAGNOSIS — R0602 Shortness of breath: Secondary | ICD-10-CM

## 2013-01-01 DIAGNOSIS — R634 Abnormal weight loss: Secondary | ICD-10-CM

## 2013-01-01 MED ORDER — IOHEXOL 300 MG/ML  SOLN
100.0000 mL | Freq: Once | INTRAMUSCULAR | Status: AC | PRN
  Administered 2013-01-01: 100 mL via INTRAVENOUS

## 2013-01-04 ENCOUNTER — Other Ambulatory Visit: Payer: Self-pay | Admitting: *Deleted

## 2013-01-04 ENCOUNTER — Ambulatory Visit (INDEPENDENT_AMBULATORY_CARE_PROVIDER_SITE_OTHER): Payer: Medicare Other | Admitting: Internal Medicine

## 2013-01-04 DIAGNOSIS — E538 Deficiency of other specified B group vitamins: Secondary | ICD-10-CM

## 2013-01-04 MED ORDER — CYANOCOBALAMIN 1000 MCG/ML IJ SOLN
1000.0000 ug | INTRAMUSCULAR | Status: AC
  Administered 2013-01-04: 1000 ug via INTRAMUSCULAR

## 2013-01-04 MED ORDER — LORAZEPAM 0.5 MG PO TABS
ORAL_TABLET | ORAL | Status: DC
Start: 2013-01-04 — End: 2013-03-04

## 2013-02-04 ENCOUNTER — Encounter: Payer: Self-pay | Admitting: *Deleted

## 2013-02-04 ENCOUNTER — Ambulatory Visit (INDEPENDENT_AMBULATORY_CARE_PROVIDER_SITE_OTHER): Payer: Medicare Other | Admitting: Internal Medicine

## 2013-02-04 ENCOUNTER — Telehealth: Payer: Self-pay | Admitting: *Deleted

## 2013-02-04 DIAGNOSIS — E538 Deficiency of other specified B group vitamins: Secondary | ICD-10-CM

## 2013-02-04 MED ORDER — CYANOCOBALAMIN 1000 MCG/ML IJ SOLN
1000.0000 ug | INTRAMUSCULAR | Status: DC
  Administered 2013-02-04: 1000 ug via INTRAMUSCULAR

## 2013-02-04 MED ORDER — CYANOCOBALAMIN 500 MCG/0.1ML NA SOLN: NASAL | Status: DC

## 2013-02-04 NOTE — Telephone Encounter (Signed)
Scheduled OV on 02/23/13 8:45 AM with Dr. Juanda Chance. Patient notified.

## 2013-02-04 NOTE — Telephone Encounter (Signed)
Pt here for his second B12 injection. Pt asked when he will see Dr Juanda Chance again. Pt states he continues to lose weight and has lost even more since he 1st saw Dr Juanda Chance. Explained to pt that I will give this to Dr Regino Schultze nurse and she will check with Dr Juanda Chance. Also informed pt we are very low on Vit B12 and he may not be able to get one next month. I will send in a script for B12 nasal spray, but I do not know how expensive it is; pt stated understanding.  Rene Kocher can you check with Dr Juanda Chance about pt complaints; also inform him I did order the Nascobal, but I don't know how expensive it is? Thanks.

## 2013-02-04 NOTE — Telephone Encounter (Signed)
I have left a message for Mrs Astarita, all tests have been normal, I would like to see him in 2 weeks if possible to discuss where to go from here.

## 2013-02-12 ENCOUNTER — Telehealth: Payer: Self-pay | Admitting: *Deleted

## 2013-02-12 DIAGNOSIS — E538 Deficiency of other specified B group vitamins: Secondary | ICD-10-CM

## 2013-02-12 NOTE — Telephone Encounter (Signed)
Prior authorization needed for Nascobal per fax from [facility].  I called [phone].  I spoke with Elease Hashimoto.  Per Elease Hashimoto I have to speak to clinical department for review.  I was transferred to [facility] and per Leanne there is no exception that can be made because Medicare does not cover any vitamins or mineral under federal law.   Patient will be notified per [facility] by Medicare.

## 2013-02-18 NOTE — Telephone Encounter (Signed)
OK to give oral B12  1000u daily x 1 month then recheck

## 2013-02-18 NOTE — Telephone Encounter (Signed)
Dr Juanda Chance, does patient need to be on oral supplementation of b12 (even though I realize it is not absorbed nearly as well as IM injection) until b12 injectable becomes available again? Nascabol was denied by patient's insurance.

## 2013-02-22 NOTE — Telephone Encounter (Addendum)
Patient advised to begin otc oral b12, 1000 mcg daily x 1 month. We will recheck levels on 03/26/13. Patient verbalizes understanding.

## 2013-02-22 NOTE — Addendum Note (Signed)
Addended by: Richardson Chiquito on: 02/22/2013 12:16 PM   Modules accepted: Orders

## 2013-02-22 NOTE — Telephone Encounter (Deleted)
e

## 2013-02-23 ENCOUNTER — Ambulatory Visit (INDEPENDENT_AMBULATORY_CARE_PROVIDER_SITE_OTHER): Payer: Medicare Other | Admitting: Internal Medicine

## 2013-02-23 ENCOUNTER — Encounter: Payer: Self-pay | Admitting: Internal Medicine

## 2013-02-23 VITALS — BP 100/60 | HR 68 | Ht 71.25 in | Wt 156.0 lb

## 2013-02-23 DIAGNOSIS — R413 Other amnesia: Secondary | ICD-10-CM

## 2013-02-23 DIAGNOSIS — R634 Abnormal weight loss: Secondary | ICD-10-CM

## 2013-02-23 DIAGNOSIS — R911 Solitary pulmonary nodule: Secondary | ICD-10-CM

## 2013-02-23 NOTE — Patient Instructions (Addendum)
We have given you a brochure regarding gas to look at.  We will contact you regarding an appointment at [facility]. Their office is located on [address],  [place], [place] [address]. Their phone number is [phone].  CC: Dr Rinaldo Cloud

## 2013-02-23 NOTE — Progress Notes (Signed)
Anthony Skinner 1941-10-24 MRN [id number]   History of Present Illness:  This is a 72 year old white male who is here for followup of weight loss. Last appointment was 12/30/2012 at which time he weighed149 pounds. Today he weighs 153 pounds. He denies any specific symptoms of abdominal pain, nausea, vomiting or change in bowel habits. He is complaining of increased gas. His blood tests were negative for celiac disease. His albumin was 4.2. His TSH was 1.4 and liver function tests were normal. His B12 level was 294. On a CT scan of the chest in 2011, he was found to have a 3 mm nodule in right lower lobe which required surveillance in 6 months. A repeat CT ordered by myself in January of this year showed the nodule to likely be benign, however, a repeat CT of the chest was again suggested to be completed around July 2014. The patient and his wife have been complaining of patient's loss of memory and falling asleep during the day. He takes Ativan 0.5 mg at bedtime which helps with sleep. Patient's mother died of Alzheimer's disease and he is interested in being being evaluated for memory loss.   Past Medical History  Diagnosis Date  . Iron deficiency anemia   . Tubular adenoma of colon 2012  . Gastropathy 2012    reactive  . PUD (peptic ulcer disease)   . Prostate cancer   . Heart attack   . Hyperlipidemia   . AVM (arteriovenous malformation) brain   . GERD (gastroesophageal reflux disease)   . Hiatal hernia   . Recurrent spontaneous pneumothorax   . DJD (degenerative joint disease)   . Hypertension   . Upper GI bleed   . Fatty liver    Past Surgical History  Procedure Laterality Date  . Prostatectomy    . Vertebral artery stent    . Lung removal, partial  1960s    left    reports that he quit smoking about 16 years ago. His smoking use included Cigarettes. He smoked 0.00 packs per day. He has never used smokeless tobacco. He reports that he does not drink alcohol or use illicit  drugs. family history includes Alzheimer's disease in his mother; Heart attack in his father; and Prostate cancer in his brother. No Known Allergies      Review of Systems: Negative for chest pain abdominal pain diarrhea nausea  The remainder of the 10 point ROS is negative except as outlined in H&P   Physical Exam: General appearance  Well developed, in no distress. Neurological alert and oriented x 3. Psychological normal mood and affect.  Assessment and Plan:  Problem #60 72 year old white male with weight loss. Patient has now gained 4 pounds since his last appointment. He will continue increased caloric intake. I have given him samples of a probiotic for bloating. I have also given him a brochure on gas producing foods. His sprue profile was negative. I will see him in 4-5 months.  Problem #2 Abnormal CT scan of the chest from January 2011 indicating a 3 mm nodule in the right lower lobe which would require surveillance in 6 months. Follow up in 2014 showed stable nodule with suggested follow up again in 6 months. We will schedule him for a CT scan of the chest around July 2014.  Problem #3 History of upper GI bleed. Stable.  Problem #4 History of B12 deficiency. Patient currently has a normal B12 level.  Problem #5 Memory loss. We will refer him to neurology.  02/23/2013 Lina Sar

## 2013-02-26 ENCOUNTER — Telehealth: Payer: Self-pay | Admitting: Internal Medicine

## 2013-02-26 NOTE — Telephone Encounter (Signed)
Called [facility] and left message for Diane (referral coordinator) that patient/family has changed their minds and prefers not to have neurologic consultation at this time.

## 2013-02-26 NOTE — Telephone Encounter (Signed)
OK  DB

## 2013-03-04 ENCOUNTER — Other Ambulatory Visit: Payer: Self-pay | Admitting: Internal Medicine

## 2013-04-05 ENCOUNTER — Encounter: Payer: Self-pay | Admitting: *Deleted

## 2013-04-05 ENCOUNTER — Other Ambulatory Visit (INDEPENDENT_AMBULATORY_CARE_PROVIDER_SITE_OTHER): Payer: Medicare Other

## 2013-04-05 DIAGNOSIS — E538 Deficiency of other specified B group vitamins: Secondary | ICD-10-CM

## 2013-04-05 LAB — VITAMIN B12: Vitamin B-12: 301 pg/mL (ref 211–911)

## 2013-04-06 ENCOUNTER — Other Ambulatory Visit: Payer: Self-pay | Admitting: *Deleted

## 2013-04-06 MED ORDER — CYANOCOBALAMIN 1000 MCG PO TABS: ORAL_TABLET | ORAL | Status: DC

## 2013-06-15 ENCOUNTER — Telehealth: Payer: Self-pay | Admitting: *Deleted

## 2013-06-15 DIAGNOSIS — R911 Solitary pulmonary nodule: Secondary | ICD-10-CM

## 2013-06-15 NOTE — Telephone Encounter (Signed)
Spoke with patient and his wife. Would like this scheduled in August. Scheduled on 07/19/13 at 10:30 AM at [facility] CT. NPO 2 hours prior. Patient will need BUN and Creatinine prior. Patient's wife aware of appointment date, time and instructions. He will come in for labs prior

## 2013-06-15 NOTE — Telephone Encounter (Signed)
Message copied by Daphine Deutscher on Tue Jun 15, 2013  9:32 AM ------      Message from: Daphine Deutscher      Created: Mon Jan 04, 2013  9:01 AM       Schedule patient for 6 month f/u CT chest f/u RLL nodule ------

## 2013-07-02 ENCOUNTER — Telehealth: Payer: Self-pay | Admitting: Internal Medicine

## 2013-07-02 DIAGNOSIS — R6889 Other general symptoms and signs: Secondary | ICD-10-CM

## 2013-07-02 NOTE — Telephone Encounter (Signed)
Spoke with patient's wife. She states the patient is getting worse with his forgetfulness. She is asking for a referral to someone to "test him." She states he does not have a PCP. Please, advise.

## 2013-07-02 NOTE — Telephone Encounter (Signed)
I referred him to neurology in March 2014, did he keep his appointment ? Will refer him again but has to keep his appointment.

## 2013-07-02 NOTE — Telephone Encounter (Signed)
Spoke with [facility]. They will have MD review patient records and will call patient with appointment.

## 2013-07-15 ENCOUNTER — Ambulatory Visit (INDEPENDENT_AMBULATORY_CARE_PROVIDER_SITE_OTHER): Payer: Medicare Other | Admitting: Neurology

## 2013-07-15 ENCOUNTER — Encounter: Payer: Self-pay | Admitting: Neurology

## 2013-07-15 VITALS — BP 132/70 | HR 64 | Temp 97.4°F | Resp 16 | Wt 150.0 lb

## 2013-07-15 DIAGNOSIS — E538 Deficiency of other specified B group vitamins: Secondary | ICD-10-CM

## 2013-07-15 DIAGNOSIS — G4710 Hypersomnia, unspecified: Secondary | ICD-10-CM

## 2013-07-15 DIAGNOSIS — G4719 Other hypersomnia: Secondary | ICD-10-CM

## 2013-07-15 DIAGNOSIS — G609 Hereditary and idiopathic neuropathy, unspecified: Secondary | ICD-10-CM | POA: Insufficient documentation

## 2013-07-15 DIAGNOSIS — R413 Other amnesia: Secondary | ICD-10-CM

## 2013-07-15 LAB — RPR

## 2013-07-15 LAB — FOLATE: Folate: 24.8 ng/mL (ref >5.9)

## 2013-07-15 LAB — GLUCOSE, RANDOM: Glucose, Bld: 78 mg/dL (ref 70–99)

## 2013-07-15 LAB — VITAMIN B12: Vitamin B-12: 279 pg/mL (ref 211–911)
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
## 2013-07-15 NOTE — Patient Instructions (Addendum)
Your MRI is scheduled for Tuesday, Aug 5th at 10:00am.  Please arrive to [facility], first floor admitting by:45am.  [phone].  Entrance A  Your sleep study is scheduled for Monday, Aug 18th at 8:00pm.  Please arrive to the [facility] by 7:45pm.  [address] (third floor) Depending on the results of the sleep study you may do another test on Tuesday the 19th.  You will get some more information in the mail.  Follow up around the first week of September.  No Driving!!!

## 2013-07-19 ENCOUNTER — Other Ambulatory Visit: Payer: Medicare Other

## 2013-07-19 ENCOUNTER — Other Ambulatory Visit (INDEPENDENT_AMBULATORY_CARE_PROVIDER_SITE_OTHER): Payer: Medicare Other

## 2013-07-19 DIAGNOSIS — E538 Deficiency of other specified B group vitamins: Secondary | ICD-10-CM

## 2013-07-19 LAB — IMMUNOFIXATION ELECTROPHORESIS
IgA: 322 mg/dL (ref 68–379)
IgA: 324 mg/dL (ref 68–379)
IgG (Immunoglobin G), Serum: 1020 mg/dL (ref 650–1600)
IgG (Immunoglobin G), Serum: 1060 mg/dL (ref 650–1600)
IgM, Serum: 86 mg/dL (ref 41–251)
IgM, Serum: 86 mg/dL (ref 41–251)
Total Protein, Serum Electrophoresis: 7 g/dL (ref 6.0–8.3)
Total Protein, Serum Electrophoresis: 7 g/dL (ref 6.0–8.3)

## 2013-07-19 LAB — PROTEIN ELECTROPHORESIS, SERUM
Albumin ELP: 59.7 % (ref 55.8–66.1)
Alpha-1-Globulin: 3.4 % (ref 2.9–4.9)
Alpha-2-Globulin: 9.1 % (ref 7.1–11.8)
Beta 2: 5.2 % (ref 3.2–6.5)
Beta Globulin: 7.3 % — ABNORMAL HIGH (ref 4.7–7.2)
Gamma Globulin: 15.3 % (ref 11.1–18.8)
Total Protein, Serum Electrophoresis: 7 g/dL (ref 6.0–8.3)

## 2013-07-19 LAB — UIFE/LIGHT CHAINS/TP QN, 24-HR UR
Albumin, U: DETECTED
Alpha 1, Urine: DETECTED — AB
Alpha 2, Urine: DETECTED — AB
Beta, Urine: DETECTED — AB
Free Kappa Lt Chains,Ur: 0.91 mg/dL (ref 0.14–2.42)
Free Kappa/Lambda Ratio: 22.75 ratio — ABNORMAL HIGH (ref 2.04–10.37)
Free Lambda Lt Chains,Ur: 0.04 mg/dL (ref 0.02–0.67)
Gamma Globulin, Urine: DETECTED — AB
Total Protein, Urine: 1.5 mg/dL

## 2013-07-19 MED ORDER — CYANOCOBALAMIN 1000 MCG/ML IJ SOLN
1000.0000 ug | Freq: Once | INTRAMUSCULAR | Status: AC
  Administered 2013-07-19: 1000 ug via INTRAMUSCULAR

## 2013-07-20 ENCOUNTER — Ambulatory Visit (HOSPITAL_COMMUNITY)
Admission: RE | Admit: 2013-07-20 | Discharge: 2013-07-20 | Disposition: A | Discharge status: Home / Self Care | Payer: Medicare Other | Source: Ambulatory Visit | Attending: Neurology | Admitting: Neurology

## 2013-07-20 DIAGNOSIS — R413 Other amnesia: Secondary | ICD-10-CM | POA: Insufficient documentation

## 2013-07-20 LAB — CREATININE, SERUM
Creatinine, Ser: 1.02 mg/dL (ref 0.50–1.35)
GFR calc Af Amer: 84 mL/min — ABNORMAL LOW (ref >90)
GFR calc non Af Amer: 72 mL/min — ABNORMAL LOW (ref >90)

## 2013-07-20 MED ORDER — GADOBENATE DIMEGLUMINE 529 MG/ML IV SOLN
15.0000 mL | Freq: Once | INTRAVENOUS | Status: AC
  Administered 2013-07-20: 14 mL via INTRAVENOUS

## 2013-08-02 ENCOUNTER — Ambulatory Visit (HOSPITAL_BASED_OUTPATIENT_CLINIC_OR_DEPARTMENT_OTHER): Payer: Medicare Other | Attending: Neurology | Admitting: Radiology

## 2013-08-02 VITALS — Ht 72.0 in | Wt 150.0 lb

## 2013-08-02 DIAGNOSIS — R0989 Other specified symptoms and signs involving the circulatory and respiratory systems: Secondary | ICD-10-CM | POA: Insufficient documentation

## 2013-08-02 DIAGNOSIS — R0609 Other forms of dyspnea: Secondary | ICD-10-CM | POA: Insufficient documentation

## 2013-08-02 DIAGNOSIS — G4733 Obstructive sleep apnea (adult) (pediatric): Secondary | ICD-10-CM | POA: Insufficient documentation

## 2013-08-02 DIAGNOSIS — G4719 Other hypersomnia: Secondary | ICD-10-CM

## 2013-08-03 ENCOUNTER — Ambulatory Visit (HOSPITAL_BASED_OUTPATIENT_CLINIC_OR_DEPARTMENT_OTHER): Payer: Medicare Other | Admitting: Radiology

## 2013-08-03 NOTE — Sleep Study (Signed)
The patient called on 08/02/2013 at 1245 inquiring about if he would need/should  have the second test(MSLT) the following day. The tech explained to the patient that he would need/should  per physicians order. The patient then agreed that he would stay.On 08/03/2013 the patient decided not to undergo the MSLT,stating that he does not and cannot sleep during the day. He stated that it would be a waste time and decided not to follow through.The tech explained to the patient how important the study was, however he still decided against the procedure.

## 2013-08-06 ENCOUNTER — Other Ambulatory Visit (INDEPENDENT_AMBULATORY_CARE_PROVIDER_SITE_OTHER): Payer: Medicare Other

## 2013-08-06 DIAGNOSIS — R911 Solitary pulmonary nodule: Secondary | ICD-10-CM

## 2013-08-06 LAB — BUN: BUN: 14 mg/dL (ref 6–23)

## 2013-08-06 LAB — CREATININE, SERUM: Creatinine, Ser: 1.1 mg/dL (ref 0.4–1.5)

## 2013-08-10 ENCOUNTER — Ambulatory Visit (INDEPENDENT_AMBULATORY_CARE_PROVIDER_SITE_OTHER)
Admission: RE | Admit: 2013-08-10 | Discharge: 2013-08-10 | Disposition: A | Discharge status: Home / Self Care | Payer: Medicare Other | Source: Ambulatory Visit | Attending: Internal Medicine | Admitting: Internal Medicine

## 2013-08-10 DIAGNOSIS — R911 Solitary pulmonary nodule: Secondary | ICD-10-CM

## 2013-08-10 MED ORDER — IOHEXOL 300 MG/ML  SOLN
80.0000 mL | Freq: Once | INTRAMUSCULAR | Status: AC | PRN
  Administered 2013-08-10: 80 mL via INTRAVENOUS

## 2013-08-11 ENCOUNTER — Telehealth: Payer: Self-pay | Admitting: *Deleted

## 2013-08-11 DIAGNOSIS — G4730 Sleep apnea, unspecified: Secondary | ICD-10-CM

## 2013-08-11 DIAGNOSIS — G4710 Hypersomnia, unspecified: Secondary | ICD-10-CM

## 2013-08-11 NOTE — Telephone Encounter (Signed)
Please call pt/wife and let them know that sleep study just showed mild sleep apnea. Make referral to sleep for a consultation. Tell them him fatigue/sleepiness is out of proportion for what I would expect for this degree of sleep apnea and want them to talk with specialist

## 2013-08-11 NOTE — Procedures (Signed)
NAME:  Anthony Skinner, Anthony Skinner NO.:  [id number]  MEDICAL RECORD NO.:  [id number]          PATIENT TYPE:  OUT  LOCATION:  SLEEP CENTER                 FACILITY:  [facility]  PHYSICIAN:  Barbaraann Share, MD,FCCPDATE OF BIRTH:  08-15[birth year]1943  DATE OF STUDY:  08/02/2013                           NOCTURNAL POLYSOMNOGRAM  REFERRING PHYSICIAN:  REBECCA S TAT  INDICATION FOR STUDY:  Hypersomnia with sleep apnea.  EPWORTH SLEEPINESS SCORE:  12.  MEDICATIONS:  SLEEP ARCHITECTURE:  The patient had a total sleep time of 302 minutes with very little slow-wave sleep and only 59 minutes of REM.  Sleep onset latency was normal at 25 minutes and REM onset was at the upper limits of normal at 112 minutes.  Sleep efficiency was poor at 68%.  RESPIRATORY DATA:  The patient was found to have 36 central and obstructive apneas, as well as 2 obstructive hypopneas, giving him an overall apnea-hypopnea index of 8 events per hour.  The patient had increased numbers of events during REM, with a REM/AHI of 19 events per hour.  The events were not significantly positional, and there was moderate snoring noted throughout.  OXYGEN DATA:  There was O2 desaturation as low as 94% with the patient's obstructive events.  CARDIAC DATA:  No clinically significant arrhythmias were noted.  MOVEMENT-PARASOMNIA:  The patient had small numbers of limb movements with no significant arousal or awakening.  There were no behavioral abnormalities noted.  IMPRESSIONS-RECOMMENDATIONS:  Very mild obstructive and central sleep apnea with an AHI of 8 events per hour and oxygen desaturation as low as 94% transiently.  The events were increased during REM.  Treatment for this degree of sleep apnea can include a trial of weight loss alone if applicable, upper airway surgery, dental appliance, and also CPAP.  Clinical correlation is suggested to see whether aggressive treatment is indicated based on its impact to  the patient's quality of life.     Barbaraann Share, MD,FCCP Diplomate, American Board of Sleep Medicine    [facility]/MEDQ  D:  08/11/2013 08:33:46  T:  08/11/2013 09:58:42  Job:  [id number]

## 2013-08-12 NOTE — Telephone Encounter (Signed)
Pt aware, but wants to keep f/u appt for next week to discuss prior to scheduling.

## 2013-08-19 ENCOUNTER — Ambulatory Visit (INDEPENDENT_AMBULATORY_CARE_PROVIDER_SITE_OTHER): Payer: Medicare Other | Admitting: Neurology

## 2013-08-19 VITALS — BP 130/68 | HR 60 | Temp 97.7°F | Resp 18 | Wt 146.0 lb

## 2013-08-19 DIAGNOSIS — R413 Other amnesia: Secondary | ICD-10-CM

## 2013-08-19 DIAGNOSIS — G609 Hereditary and idiopathic neuropathy, unspecified: Secondary | ICD-10-CM

## 2013-08-19 DIAGNOSIS — F09 Unspecified mental disorder due to known physiological condition: Secondary | ICD-10-CM

## 2013-08-19 DIAGNOSIS — E538 Deficiency of other specified B group vitamins: Secondary | ICD-10-CM

## 2013-08-19 DIAGNOSIS — R4189 Other symptoms and signs involving cognitive functions and awareness: Secondary | ICD-10-CM

## 2013-08-19 MED ORDER — CYANOCOBALAMIN 1000 MCG/ML IJ SOLN
1000.0000 ug | Freq: Once | INTRAMUSCULAR | Status: AC
  Administered 2013-08-19: 1000 ug via INTRAMUSCULAR
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
## 2013-08-19 NOTE — Patient Instructions (Addendum)
Please schedule your next B12 injection for one month.    Dr. Arbutus Leas will see you back in December.  Dr. Leonides Cave will call for the neuropsychology evaluation.

## 2013-08-20 ENCOUNTER — Telehealth: Payer: Self-pay | Admitting: Neurology

## 2013-08-20 NOTE — Telephone Encounter (Signed)
Spoke with pt's wife she said they had a family meeting last night and have decided to go elsewhere for their neurological care.  They want a different opinion and different answers.  They have a friend who reccommended a neurologist to them.  She did not say who.  She said they appreciate all we have done for them, but they are more comfortable going somewhere else.

## 2013-08-25 ENCOUNTER — Emergency Department (HOSPITAL_COMMUNITY): Payer: Medicare Other

## 2013-08-25 ENCOUNTER — Inpatient Hospital Stay (HOSPITAL_COMMUNITY)
Admission: EM | Admit: 2013-08-25 | Discharge: 2013-09-01 | DRG: 091 | Disposition: A | Discharge status: Other Acute Inpatient Hospital | Payer: Medicare Other | Attending: Family Medicine | Admitting: Family Medicine

## 2013-08-25 ENCOUNTER — Encounter (HOSPITAL_COMMUNITY): Payer: Self-pay | Admitting: *Deleted

## 2013-08-25 DIAGNOSIS — T71162A Asphyxiation due to hanging, intentional self-harm, initial encounter: Secondary | ICD-10-CM | POA: Diagnosis present

## 2013-08-25 DIAGNOSIS — R4182 Altered mental status, unspecified: Secondary | ICD-10-CM

## 2013-08-25 DIAGNOSIS — F3289 Other specified depressive episodes: Secondary | ICD-10-CM | POA: Diagnosis present

## 2013-08-25 DIAGNOSIS — I10 Essential (primary) hypertension: Secondary | ICD-10-CM

## 2013-08-25 DIAGNOSIS — G931 Anoxic brain damage, not elsewhere classified: Principal | ICD-10-CM

## 2013-08-25 DIAGNOSIS — T71164A Asphyxiation due to hanging, undetermined, initial encounter: Secondary | ICD-10-CM

## 2013-08-25 DIAGNOSIS — F0280 Dementia in other diseases classified elsewhere without behavioral disturbance: Secondary | ICD-10-CM | POA: Diagnosis present

## 2013-08-25 DIAGNOSIS — R404 Transient alteration of awareness: Secondary | ICD-10-CM

## 2013-08-25 DIAGNOSIS — J9600 Acute respiratory failure, unspecified whether with hypoxia or hypercapnia: Secondary | ICD-10-CM

## 2013-08-25 DIAGNOSIS — F329 Major depressive disorder, single episode, unspecified: Secondary | ICD-10-CM | POA: Diagnosis present

## 2013-08-25 DIAGNOSIS — G309 Alzheimer's disease, unspecified: Secondary | ICD-10-CM | POA: Diagnosis present

## 2013-08-25 DIAGNOSIS — R4020 Unspecified coma: Secondary | ICD-10-CM | POA: Diagnosis present

## 2013-08-25 DIAGNOSIS — T1491XA Suicide attempt, initial encounter: Secondary | ICD-10-CM

## 2013-08-25 DIAGNOSIS — E876 Hypokalemia: Secondary | ICD-10-CM

## 2013-08-25 DIAGNOSIS — R4189 Other symptoms and signs involving cognitive functions and awareness: Secondary | ICD-10-CM

## 2013-08-25 DIAGNOSIS — R569 Unspecified convulsions: Secondary | ICD-10-CM

## 2013-08-25 LAB — CBC WITH DIFFERENTIAL/PLATELET
Basophils Absolute: 0 10*3/uL (ref 0.0–0.1)
Basophils Absolute: 0 10*3/uL (ref 0.0–0.1)
Basophils Relative: 1 % (ref 0–1)
Basophils Relative: 0 % (ref 0–1)
Eosinophils Absolute: 0.1 10*3/uL (ref 0.0–0.7)
Eosinophils Absolute: 0 10*3/uL (ref 0.0–0.7)
Eosinophils Relative: 2 % (ref 0–5)
Eosinophils Relative: 0 % (ref 0–5)
HCT: 43.3 % (ref 39.0–52.0)
HCT: 40.2 % (ref 39.0–52.0)
Hemoglobin: 14.7 g/dL (ref 13.0–17.0)
Hemoglobin: 13.7 g/dL (ref 13.0–17.0)
Lymphocytes Relative: 43 % (ref 12–46)
Lymphocytes Relative: 4 % — ABNORMAL LOW (ref 12–46)
Lymphs Abs: 3.2 10*3/uL (ref 0.7–4.0)
Lymphs Abs: 0.5 10*3/uL — ABNORMAL LOW (ref 0.7–4.0)
MCH: 28.4 pg (ref 26.0–34.0)
MCH: 28.5 pg (ref 26.0–34.0)
MCHC: 33.9 g/dL (ref 30.0–36.0)
MCHC: 34.1 g/dL (ref 30.0–36.0)
MCV: 83.8 fL (ref 78.0–100.0)
MCV: 83.8 fL (ref 78.0–100.0)
Monocytes Absolute: 0.7 10*3/uL (ref 0.1–1.0)
Monocytes Absolute: 1 10*3/uL (ref 0.1–1.0)
Monocytes Relative: 10 % (ref 3–12)
Monocytes Relative: 8 % (ref 3–12)
Neutro Abs: 3.3 10*3/uL (ref 1.7–7.7)
Neutro Abs: 10.4 10*3/uL — ABNORMAL HIGH (ref 1.7–7.7)
Neutrophils Relative %: 45 % (ref 43–77)
Neutrophils Relative %: 88 % — ABNORMAL HIGH (ref 43–77)
Platelets: 261 10*3/uL (ref 150–400)
Platelets: 223 10*3/uL (ref 150–400)
RBC: 5.17 MIL/uL (ref 4.22–5.81)
RBC: 4.8 MIL/uL (ref 4.22–5.81)
RDW: 14.7 % (ref 11.5–15.5)
RDW: 14.7 % (ref 11.5–15.5)
WBC: 7.4 10*3/uL (ref 4.0–10.5)
WBC: 11.9 10*3/uL — ABNORMAL HIGH (ref 4.0–10.5)

## 2013-08-25 LAB — POCT I-STAT 3, ART BLOOD GAS (G3+)
Acid-base deficit: 5 mmol/L — ABNORMAL HIGH (ref 0.0–2.0)
Acid-base deficit: 4 mmol/L — ABNORMAL HIGH (ref 0.0–2.0)
Bicarbonate: 20.6 mEq/L (ref 20.0–24.0)
Bicarbonate: 23.4 mEq/L (ref 20.0–24.0)
O2 Saturation: 100 %
O2 Saturation: 99 %
Patient temperature: 98.6
Patient temperature: 98.6
TCO2: 22 mmol/L (ref 0–100)
TCO2: 25 mmol/L (ref 0–100)
pCO2 arterial: 38.1 mmHg (ref 35.0–45.0)
pCO2 arterial: 48.3 mmHg — ABNORMAL HIGH (ref 35.0–45.0)
pH, Arterial: 7.34 — ABNORMAL LOW (ref 7.350–7.450)
pH, Arterial: 7.292 — ABNORMAL LOW (ref 7.350–7.450)
pO2, Arterial: 203 mmHg — ABNORMAL HIGH (ref 80.0–100.0)
pO2, Arterial: 132 mmHg — ABNORMAL HIGH (ref 80.0–100.0)

## 2013-08-25 LAB — COMPREHENSIVE METABOLIC PANEL
ALT: 12 U/L (ref 0–53)
ALT: 11 U/L (ref 0–53)
AST: 20 U/L (ref 0–37)
AST: 23 U/L (ref 0–37)
Albumin: 4 g/dL (ref 3.5–5.2)
Albumin: 3.5 g/dL (ref 3.5–5.2)
Alkaline Phosphatase: 70 U/L (ref 39–117)
Alkaline Phosphatase: 60 U/L (ref 39–117)
BUN: 12 mg/dL (ref 6–23)
BUN: 12 mg/dL (ref 6–23)
CO2: 20 mEq/L (ref 19–32)
CO2: 23 mEq/L (ref 19–32)
Calcium: 9.3 mg/dL (ref 8.4–10.5)
Calcium: 8.3 mg/dL — ABNORMAL LOW (ref 8.4–10.5)
Chloride: 99 mEq/L (ref 96–112)
Chloride: 100 mEq/L (ref 96–112)
Creatinine, Ser: 1.09 mg/dL (ref 0.50–1.35)
Creatinine, Ser: 1.01 mg/dL (ref 0.50–1.35)
GFR calc Af Amer: 77 mL/min — ABNORMAL LOW (ref >90)
GFR calc Af Amer: 85 mL/min — ABNORMAL LOW (ref >90)
GFR calc non Af Amer: 67 mL/min — ABNORMAL LOW (ref >90)
GFR calc non Af Amer: 73 mL/min — ABNORMAL LOW (ref >90)
Glucose, Bld: 152 mg/dL — ABNORMAL HIGH (ref 70–99)
Glucose, Bld: 197 mg/dL — ABNORMAL HIGH (ref 70–99)
Potassium: 3.8 mEq/L (ref 3.5–5.1)
Potassium: 4.1 mEq/L (ref 3.5–5.1)
Sodium: 135 mEq/L (ref 135–145)
Sodium: 133 mEq/L — ABNORMAL LOW (ref 135–145)
Total Bilirubin: 0.5 mg/dL (ref 0.3–1.2)
Total Bilirubin: 0.5 mg/dL (ref 0.3–1.2)
Total Protein: 7.3 g/dL (ref 6.0–8.3)
Total Protein: 6.3 g/dL (ref 6.0–8.3)

## 2013-08-25 LAB — URINE MICROSCOPIC-ADD ON

## 2013-08-25 LAB — MAGNESIUM
Magnesium: 2.1 mg/dL (ref 1.5–2.5)
Magnesium: 1.8 mg/dL (ref 1.5–2.5)

## 2013-08-25 LAB — URINALYSIS, ROUTINE W REFLEX MICROSCOPIC
Bilirubin Urine: NEGATIVE
Glucose, UA: NEGATIVE mg/dL
Hgb urine dipstick: NEGATIVE
Ketones, ur: NEGATIVE mg/dL
Leukocytes, UA: NEGATIVE
Nitrite: NEGATIVE
Protein, ur: 30 mg/dL — AB
Specific Gravity, Urine: 1.013 (ref 1.005–1.030)
Urobilinogen, UA: 0.2 mg/dL (ref 0.0–1.0)
pH: 5.5 (ref 5.0–8.0)

## 2013-08-25 LAB — TYPE AND SCREEN
ABO/RH(D): A POS
Antibody Screen: NEGATIVE

## 2013-08-25 LAB — PHOSPHORUS: Phosphorus: 2.8 mg/dL (ref 2.3–4.6)

## 2013-08-25 LAB — CK: Total CK: 98 U/L (ref 7–232)

## 2013-08-25 LAB — PROTIME-INR
INR: 1.21 (ref 0.00–1.49)
Prothrombin Time: 15 seconds (ref 11.6–15.2)

## 2013-08-25 LAB — APTT
aPTT: 29 seconds (ref 24–37)
aPTT: 29 seconds (ref 24–37)

## 2013-08-25 LAB — ABO/RH: ABO/RH(D): A POS

## 2013-08-25 LAB — MRSA PCR SCREENING: MRSA by PCR: NEGATIVE

## 2013-08-25 LAB — CG4 I-STAT (LACTIC ACID): Lactic Acid, Venous: 5.87 mmol/L — ABNORMAL HIGH (ref 0.5–2.2)

## 2013-08-25 LAB — GLUCOSE, CAPILLARY: Glucose-Capillary: 135 mg/dL — ABNORMAL HIGH (ref 70–99)

## 2013-08-25 MED ORDER — SODIUM CHLORIDE 0.9 % IV SOLN
INTRAVENOUS | Status: DC
  Administered 2013-08-25: 14:00:00 via INTRAVENOUS

## 2013-08-25 MED ORDER — CHLORHEXIDINE GLUCONATE 0.12 % MT SOLN
15.0000 mL | Freq: Two times a day (BID) | OROMUCOSAL | Status: DC
  Administered 2013-08-25 – 2013-08-26 (×3): 15 mL via OROMUCOSAL
  Filled 2013-08-25 (×2): qty 15

## 2013-08-25 MED ORDER — LORAZEPAM 2 MG/ML IJ SOLN
1.0000 mg | Freq: Once | INTRAMUSCULAR | Status: AC
  Administered 2013-08-25: 1 mg via INTRAVENOUS

## 2013-08-25 MED ORDER — PROPOFOL 10 MG/ML IV EMUL
5.0000 ug/kg/min | INTRAVENOUS | Status: DC
  Administered 2013-08-25 – 2013-08-26 (×2): 10 ug/kg/min via INTRAVENOUS
  Filled 2013-08-25: qty 100

## 2013-08-25 MED ORDER — ROCURONIUM BROMIDE 50 MG/5ML IV SOLN
80.0000 mg | Freq: Once | INTRAVENOUS | Status: AC
  Administered 2013-08-25: 80 mg via INTRAVENOUS

## 2013-08-25 MED ORDER — ETOMIDATE 2 MG/ML IV SOLN
20.0000 mg | Freq: Once | INTRAVENOUS | Status: AC
  Administered 2013-08-25: 20 mg via INTRAVENOUS

## 2013-08-25 MED ORDER — LIDOCAINE HCL 2 % IJ SOLN: 5.0000 mL | Freq: Once | INTRAMUSCULAR | Status: DC

## 2013-08-25 MED ORDER — BIOTENE DRY MOUTH MT LIQD
15.0000 mL | Freq: Four times a day (QID) | OROMUCOSAL | Status: DC
  Administered 2013-08-26 – 2013-08-27 (×6): 15 mL via OROMUCOSAL

## 2013-08-25 MED ORDER — LORAZEPAM 2 MG/ML IJ SOLN
INTRAMUSCULAR | Status: AC
  Administered 2013-08-25: 1 mg via INTRAVENOUS
  Filled 2013-08-25: qty 1

## 2013-08-25 MED ORDER — LORAZEPAM 2 MG/ML IJ SOLN
INTRAMUSCULAR | Status: AC
  Filled 2013-08-25: qty 1

## 2013-08-25 MED ORDER — SODIUM CHLORIDE 0.9 % IV BOLUS (SEPSIS)
1000.0000 mL | Freq: Once | INTRAVENOUS | Status: AC
  Administered 2013-08-25: 1000 mL via INTRAVENOUS

## 2013-08-25 MED ORDER — SODIUM CHLORIDE 0.9 % IV SOLN
20.0000 mg/kg | INTRAVENOUS | Status: AC
  Administered 2013-08-25: 1360 mg via INTRAVENOUS
  Filled 2013-08-25: qty 27.2

## 2013-08-25 MED ORDER — PANTOPRAZOLE SODIUM 40 MG IV SOLR
40.0000 mg | INTRAVENOUS | Status: DC
  Administered 2013-08-26: 40 mg via INTRAVENOUS
  Filled 2013-08-25 (×3): qty 40

## 2013-08-25 MED ORDER — LIDOCAINE HCL (CARDIAC) 20 MG/ML IV SOLN
INTRAVENOUS | Status: AC | PRN
  Administered 2013-08-25: 100 mg via INTRAVENOUS

## 2013-08-25 MED ORDER — FENTANYL CITRATE 0.05 MG/ML IJ SOLN
100.0000 ug | Freq: Once | INTRAMUSCULAR | Status: AC
  Administered 2013-08-25: 100 ug via INTRAVENOUS

## 2013-08-25 MED ORDER — SODIUM CHLORIDE 0.9 % IV SOLN
25.0000 ug/h | INTRAVENOUS | Status: DC
  Administered 2013-08-25: 25 ug/h via INTRAVENOUS
  Filled 2013-08-25: qty 50

## 2013-08-25 MED ORDER — SODIUM CHLORIDE 0.9 % IV SOLN: 20.0000 mg/kg | Freq: Once | INTRAVENOUS | Status: DC

## 2013-08-25 NOTE — ED Notes (Signed)
Report given to Kim, RN.

## 2013-08-25 NOTE — Procedures (Signed)
History: 72 year old male status post hanging with posturing versus seizure  Sedation:   Background: This is a very low voltage EEG(reviewed on sensitivity of 2). There are some periodic runs of sleep-structure-like activity, though there continues to be delta activity between these discharges.    EEG Abnormalities: 1) Diffusely low amplitude EEG. 2) slow EEG 3) Absent PDR  Clinical Interpretation: This  EEG is consistent with a severe generalized non-specific cerebral dysfunction(encephalopathy). There was no seizure or seizure predisposition recorded on this study.   Ritta Slot, MD [facility] [phone]  If 7pm- 7am, please page neurology on call at [phone].

## 2013-08-25 NOTE — Progress Notes (Signed)
Level I trauma activation for hanging with GCS score 3.  Patient was extensor posturing, may have been seizing.  He was found dangling by his neck by his wife, but probably was not like that for more than 5-10 minutes.  They had been arging a bit, but the patient impulsively left the room and went on the porch which is where he hung himself.  The EDP and Neurologist that were seeing the patient also wanted to give the patient Ativan for immediate treatment of what may have been seizure activity.  I wanted to wait until the patient was intubated before sending him to CT for head and C-spine CT.  He is not intubated and under the care of the neurology service.  We will make sure that his C-spine is cleared.  Marta Lamas. Gae Bon, MD, FACS [phone] Trauma Surgeon

## 2013-08-25 NOTE — ED Notes (Signed)
Dr. Yacoub at bedside  

## 2013-08-25 NOTE — ED Provider Notes (Signed)
CSN: [id number]     Arrival date & time 08/25/13  1108 History   None    Chief Complaint  Patient presents with  . Altered Mental Status   (Consider location/radiation/quality/duration/timing/severity/associated sxs/prior Treatment) HPI  72 year old male brought in by EMS unresponsive. Patient was found by his wife hanging from his neck from porch after an argument. She cut him down but. He was unresponsive when she found him. It is unclear how long patient may have been hanging for.. Patient reportedly recently diagnosed with Alzheimer's and has been despondent. No further history available at this time as pt unresponsive and wife not at bedside currently. On arrival to ED, GCS 5. Posturing versus seizure.    .No past medical history on file. No past surgical history on file. No family history on file. History  Substance Use Topics  . Smoking status: Not on file  . Smokeless tobacco: Not on file  . Alcohol Use: Not on file    Review of Systems  Level 5 caveat applies because pt is unresponsive.   Allergies  Review of patient's allergies indicates not on file.  Home Medications  No current outpatient prescriptions on file. There were no vitals taken for this visit. Physical Exam  Nursing note and vitals reviewed. Constitutional: He appears well-developed. He appears distressed.  HENT:  Head: Normocephalic.  Faint erythema across upper anterior neck extending laterally to just past angle of mandible b/l. Upper dentures. No oral lesions/trauma noted. No neck crepitus.   Eyes: Conjunctivae are normal. Right eye exhibits no discharge. Left eye exhibits no discharge.  Neck: Neck supple.  Cardiovascular: Normal rate, regular rhythm and normal heart sounds.  Exam reveals no gallop and no friction rub.   No murmur heard. Pulmonary/Chest:  sonorous respirations. Lungs clear b/l.   Abdominal: Soft. He exhibits no distension. There is no tenderness.  Musculoskeletal: He exhibits no  edema and no tenderness.  Neurological:  GCS 5. Possible decorticate posturing L side. RUE extends to painful stimuli. Both feet plantar flexed and L ankle clonus.   Skin: Skin is warm and dry. There is pallor.  Abrasions to mid aspect L thigh    ED Course  Procedures (including critical care time)  INTUBATION Performed by: Raeford Razor  Required items: required blood products, implants, devices, and special equipment available Patient identity confirmed: provided demographic data and hospital-assigned identification number Time out: Immediately prior to procedure a "time out" was called to verify the correct patient, procedure, equipment, support staff and site/side marked as required.  Indications: airway protection  Intubation method: Glidescope Laryngoscopy   Preoxygenation: BVM  Sedatives: etomidate  Paralytic: rocuronium  Tube Size: 8.0 cuffed  Post-procedure assessment: chest rise and ETCO2 monitor Breath sounds: equal and absent over the epigastrium Tube secured with: ETT holder Chest x-ray interpreted by radiologist and me.  Chest x-ray findings: endotracheal tube in appropriate position  Patient tolerated the procedure well with no immediate complications.  CRITICAL CARE Performed by: Raeford Razor  Total critical care time: 40 minutes  Critical care time was exclusive of separately billable procedures and treating other patients. Critical care was necessary to treat or prevent imminent or life-threatening deterioration. Critical care was time spent personally by me on the following activities: development of treatment plan with patient and/or surrogate as well as nursing, discussions with consultants, evaluation of patient's response to treatment, examination of patient, obtaining history from patient or surrogate, ordering and performing treatments and interventions, ordering and review of laboratory studies, ordering and  review of radiographic studies,  pulse oximetry and re-evaluation of patient's condition.     Labs Review Labs Reviewed  COMPREHENSIVE METABOLIC PANEL - Abnormal; Notable for the following:    Glucose, Bld 152 (*)    GFR calc non Af Amer 67 (*)    GFR calc Af Amer 77 (*)    All other components within normal limits  GLUCOSE, CAPILLARY - Abnormal; Notable for the following:    Glucose-Capillary 135 (*)    All other components within normal limits  URINALYSIS, ROUTINE W REFLEX MICROSCOPIC - Abnormal; Notable for the following:    APPearance CLOUDY (*)    Protein, ur 30 (*)    All other components within normal limits  URINE MICROSCOPIC-ADD ON - Abnormal; Notable for the following:    Casts GRANULAR CAST (*)    All other components within normal limits  CBC WITH DIFFERENTIAL - Abnormal; Notable for the following:    WBC 11.9 (*)    Neutrophils Relative % 88 (*)    Neutro Abs 10.4 (*)    Lymphocytes Relative 4 (*)    Lymphs Abs 0.5 (*)    All other components within normal limits  POCT I-STAT 3, BLOOD GAS (G3+) - Abnormal; Notable for the following:    pH, Arterial 7.340 (*)    pO2, Arterial 203.0 (*)    Acid-base deficit 5.0 (*)    All other components within normal limits  CG4 I-STAT (LACTIC ACID) - Abnormal; Notable for the following:    Lactic Acid, Venous 5.87 (*)    All other components within normal limits  POCT I-STAT 3, BLOOD GAS (G3+) - Abnormal; Notable for the following:    pH, Arterial 7.292 (*)    pCO2 arterial 48.3 (*)    pO2, Arterial 132.0 (*)    Acid-base deficit 4.0 (*)    All other components within normal limits  MRSA PCR SCREENING  CBC WITH DIFFERENTIAL  MAGNESIUM  APTT  CK  COMPREHENSIVE METABOLIC PANEL  APTT  PROTIME-INR  BLOOD GAS, ARTERIAL  MAGNESIUM  PHOSPHORUS  TYPE AND SCREEN  ABO/RH   Imaging Review Ct Head Wo Contrast  08/25/2013   CLINICAL DATA:  Unresponsive, found hanging  EXAM: CT HEAD WITHOUT CONTRAST  CT CERVICAL SPINE WITHOUT CONTRAST  TECHNIQUE:  Multidetector CT imaging of the head and cervical spine was performed following the standard protocol without intravenous contrast. Multiplanar CT image reconstructions of the cervical spine were also generated.  COMPARISON:  None  FINDINGS: CT HEAD FINDINGS  Normal ventricular morphology. No midline shift or mass effect. Normal appearance of brain parenchyma. No intracranial hemorrhage, mass lesion, or acute infarction. Visualized paranasal sinuses and mastoid air cells clear. Bones unremarkable.  CT CERVICAL SPINE FINDINGS  Endotracheal tube within the trachea.  Prevertebral soft tissues normal thickness.  Visualized skull base intact.  Orogastric tube traverses cervical esophagus.  Disc space narrowing with endplate spur formation at C4-C5, C5-C6, and C6-C7, minimally at C3-C4.  Vertebral body heights maintained without fracture or subluxation.  C1-C2 alignment normal.  Biapical lung scarring greater on left. No definite soft tissue abnormalities.  IMPRESSION: CT HEAD IMPRESSION  No acute intracranial abnormalities.  CT CERVICAL SPINE IMPRESSION  No acute cervical spine abnormalities.  Multilevel degenerative disc disease changes cervical spine.   Electronically Signed   By: Ulyses Southward M.D.   On: 08/25/2013 12:26   Ct Cervical Spine Wo Contrast  08/25/2013   CLINICAL DATA:  Unresponsive, found hanging  EXAM: CT HEAD WITHOUT  CONTRAST  CT CERVICAL SPINE WITHOUT CONTRAST  TECHNIQUE: Multidetector CT imaging of the head and cervical spine was performed following the standard protocol without intravenous contrast. Multiplanar CT image reconstructions of the cervical spine were also generated.  COMPARISON:  None  FINDINGS: CT HEAD FINDINGS  Normal ventricular morphology. No midline shift or mass effect. Normal appearance of brain parenchyma. No intracranial hemorrhage, mass lesion, or acute infarction. Visualized paranasal sinuses and mastoid air cells clear. Bones unremarkable.  CT CERVICAL SPINE FINDINGS   Endotracheal tube within the trachea.  Prevertebral soft tissues normal thickness.  Visualized skull base intact.  Orogastric tube traverses cervical esophagus.  Disc space narrowing with endplate spur formation at C4-C5, C5-C6, and C6-C7, minimally at C3-C4.  Vertebral body heights maintained without fracture or subluxation.  C1-C2 alignment normal.  Biapical lung scarring greater on left. No definite soft tissue abnormalities.  IMPRESSION: CT HEAD IMPRESSION  No acute intracranial abnormalities.  CT CERVICAL SPINE IMPRESSION  No acute cervical spine abnormalities.  Multilevel degenerative disc disease changes cervical spine.   Electronically Signed   By: Ulyses Southward M.D.   On: 08/25/2013 12:26   Dg Chest Portable 1 View  08/25/2013   *RADIOLOGY REPORT*  Clinical Data: Intubated  PORTABLE CHEST - 1 VIEW  Comparison: Portable chest x-ray of 08/25/2013  Findings: The tip of the endotracheal tube is approximately 2.9 cm above the carina. The lungs appear clear.  An NG tube is present. The heart is within normal limits in size.  IMPRESSION:  1.  Endotracheal tube tip 2.9 cm above the carina. 2.  No active lung disease.   Original Report Authenticated By: Dwyane Dee, M.D.   Dg Chest Portable 1 View  08/25/2013   *RADIOLOGY REPORT*  Clinical Data: Altered mental status, uncontrollable shaking  PORTABLE CHEST - 1 VIEW  Comparison: None.  Findings: No active infiltrate or effusion is seen.  A probable calcified granuloma is noted at the right lung base.  Mediastinal contours appear normal.  The heart is within normal limits in size. No bony abnormality is seen.  IMPRESSION: No active lung disease.   Original Report Authenticated By: Dwyane Dee, M.D.    MDM   1. Hanging, initial encounter   2. Unresponsive     72 year old male status post hanging. Unresponsive. Unfortunately wife not available expediently to address code status on arrival and patient was intubated for airway protection. Patient with posturing  versus seizing on arrival. Ativan ordered and neurology was curbsided in the emergency room. Additional benzos and fosphenytoin load. Made level 1 trauma because of mechanism and exam. Evaluated pt as well. Further trauma care pending CT results. Admit.     Raeford Razor, MD 08/25/13 [id number]

## 2013-08-25 NOTE — H&P (Signed)
PULMONARY  / CRITICAL CARE MEDICINE  Name: Anthony Skinner MRN: [id number] DOB: September 06, 1941    ADMISSION DATE:  08/25/2013 CONSULTATION DATE:  08/25/2013  REFERRING MD :  Juleen China, EDP. PRIMARY SERVICE: PCCM  CHIEF COMPLAINT:  VDRF and anoxic brain injury.  BRIEF PATIENT DESCRIPTION: 72 year old male with recent diagnosis of Alzheimer disease and subsequent depression, found by wife in shed on morning of admission hanging by a rope.  EMS was called and patient had a pulse but was not protecting his airway.  Patient was brought to the ED where he was intubated and PCCM was called to admit.  SIGNIFICANT EVENTS / STUDIES:  9/10>>>Suicide attempt by hanging.  LINES / TUBES: ETT 9/10>>> PIV Foley 9/10>>>  CULTURES: None  ANTIBIOTICS: None  PAST MEDICAL HISTORY : Alzheimer and HTN.  Prior to Admission medications   Not on File   Allergies not on file  FAMILY HISTORY:  Unattainable, patient is sedated and intubated.  SOCIAL HISTORY: Unattainable, patient is sedated and intubated.  REVIEW OF SYSTEMS:  Unattainable, patient sedated and intubated.  SUBJECTIVE: Unattainable, patient is sedated and intubated.  VITAL SIGNS: Pulse Rate:  [92-113] 92 (09/10 1157) Resp:  [14-23] 14 (09/10 1157) BP: (105-139)/(55-105) 105/58 mmHg (09/10 1157) SpO2:  [98 %-100 %] 98 % (09/10 1157) FiO2 (%):  [100 %] 100 % (09/10 1140) Weight:  [68.04 kg (150 lb)] 68.04 kg (150 lb) (09/10 1158)  HEMODYNAMICS:  VSS-AF.  VENTILATOR SETTINGS: Vent Mode:  [-] PRVC FiO2 (%):  [100 %] 100 % Set Rate:  [16 bmp] 16 bmp Vt Set:  [460 mL] 460 mL PEEP:  [5 cmH20] 5 cmH20 Plateau Pressure:  [11 cmH20] 11 cmH20 INTAKE / OUTPUT: Intake/Output   None     PHYSICAL EXAMINATION: General:  Chronically ill appearing male, sedated and intubated, not moving any ext to command/pain. Neuro:  Decerebrate posturing. HEENT:  /AT, rope bruise noted around the neck.  No corneal or dolls eye reflexes noted, pupils are  sluggish but reactive. Cardiovascular: RRR, Nl S1/S2, -M/R/G. Lungs: CTA bilaterally. Abdomen:  Soft, NT, ND and +BS. Musculoskeletal:  -edema and -tenderness. Skin:  Intact.  LABS:  CBC Recent Labs     08/25/13  1113  WBC  7.4  HGB  14.7  HCT  43.3  PLT  261   Coag's Recent Labs     08/25/13  1113  APTT  29   BMET Recent Labs     08/25/13  1113  NA  135  K  3.8  CL  99  CO2  20  BUN  12  CREATININE  1.09  GLUCOSE  152*   Electrolytes Recent Labs     08/25/13  1113  CALCIUM  9.3  MG  2.1   Sepsis Markers No results found for this basename: LACTICACIDVEN, PROCALCITON, O2SATVEN,  in the last 72 hours ABG Recent Labs     08/25/13  1123  PHART  7.340*  PCO2ART  38.1  PO2ART  203.0*   Liver Enzymes Recent Labs     08/25/13  1113  AST  20  ALT  12  ALKPHOS  70  BILITOT  0.5  ALBUMIN  4.0   Cardiac Enzymes No results found for this basename: TROPONINI, PROBNP,  in the last 72 hours Glucose Recent Labs     08/25/13  1114  GLUCAP  135*    Imaging Ct Head Wo Contrast  08/25/2013   CLINICAL DATA:  Unresponsive, found hanging  EXAM: CT  HEAD WITHOUT CONTRAST  CT CERVICAL SPINE WITHOUT CONTRAST  TECHNIQUE: Multidetector CT imaging of the head and cervical spine was performed following the standard protocol without intravenous contrast. Multiplanar CT image reconstructions of the cervical spine were also generated.  COMPARISON:  None  FINDINGS: CT HEAD FINDINGS  Normal ventricular morphology. No midline shift or mass effect. Normal appearance of brain parenchyma. No intracranial hemorrhage, mass lesion, or acute infarction. Visualized paranasal sinuses and mastoid air cells clear. Bones unremarkable.  CT CERVICAL SPINE FINDINGS  Endotracheal tube within the trachea.  Prevertebral soft tissues normal thickness.  Visualized skull base intact.  Orogastric tube traverses cervical esophagus.  Disc space narrowing with endplate spur formation at C4-C5, C5-C6, and  C6-C7, minimally at C3-C4.  Vertebral body heights maintained without fracture or subluxation.  C1-C2 alignment normal.  Biapical lung scarring greater on left. No definite soft tissue abnormalities.  IMPRESSION: CT HEAD IMPRESSION  No acute intracranial abnormalities.  CT CERVICAL SPINE IMPRESSION  No acute cervical spine abnormalities.  Multilevel degenerative disc disease changes cervical spine.   Electronically Signed   By: Ulyses Southward M.D.   On: 08/25/2013 12:26   Ct Cervical Spine Wo Contrast  08/25/2013   CLINICAL DATA:  Unresponsive, found hanging  EXAM: CT HEAD WITHOUT CONTRAST  CT CERVICAL SPINE WITHOUT CONTRAST  TECHNIQUE: Multidetector CT imaging of the head and cervical spine was performed following the standard protocol without intravenous contrast. Multiplanar CT image reconstructions of the cervical spine were also generated.  COMPARISON:  None  FINDINGS: CT HEAD FINDINGS  Normal ventricular morphology. No midline shift or mass effect. Normal appearance of brain parenchyma. No intracranial hemorrhage, mass lesion, or acute infarction. Visualized paranasal sinuses and mastoid air cells clear. Bones unremarkable.  CT CERVICAL SPINE FINDINGS  Endotracheal tube within the trachea.  Prevertebral soft tissues normal thickness.  Visualized skull base intact.  Orogastric tube traverses cervical esophagus.  Disc space narrowing with endplate spur formation at C4-C5, C5-C6, and C6-C7, minimally at C3-C4.  Vertebral body heights maintained without fracture or subluxation.  C1-C2 alignment normal.  Biapical lung scarring greater on left. No definite soft tissue abnormalities.  IMPRESSION: CT HEAD IMPRESSION  No acute intracranial abnormalities.  CT CERVICAL SPINE IMPRESSION  No acute cervical spine abnormalities.  Multilevel degenerative disc disease changes cervical spine.   Electronically Signed   By: Ulyses Southward M.D.   On: 08/25/2013 12:26   Dg Chest Portable 1 View  08/25/2013   *RADIOLOGY REPORT*   Clinical Data: Intubated  PORTABLE CHEST - 1 VIEW  Comparison: Portable chest x-ray of 08/25/2013  Findings: The tip of the endotracheal tube is approximately 2.9 cm above the carina. The lungs appear clear.  An NG tube is present. The heart is within normal limits in size.  IMPRESSION:  1.  Endotracheal tube tip 2.9 cm above the carina. 2.  No active lung disease.   Original Report Authenticated By: Dwyane Dee, M.D.   Dg Chest Portable 1 View  08/25/2013   *RADIOLOGY REPORT*  Clinical Data: Altered mental status, uncontrollable shaking  PORTABLE CHEST - 1 VIEW  Comparison: None.  Findings: No active infiltrate or effusion is seen.  A probable calcified granuloma is noted at the right lung base.  Mediastinal contours appear normal.  The heart is within normal limits in size. No bony abnormality is seen.  IMPRESSION: No active lung disease.   Original Report Authenticated By: Dwyane Dee, M.D.   CXR: Clear  ASSESSMENT / PLAN:  PULMONARY  A: VDRF due to inability to protect his airway after a suicide attempt. P:   - Full vent support. - F/U ABG. - CXR and ABG in AM.  CARDIOVASCULAR A: HTN by history, stable BP for now. P:  - Propofol. - DNR, no pressors. - Will consider beta blockers if HR increases.  RENAL A:  No active issues. P:   - BMET in AM. - Replace electrolytes as needed.  GASTROINTESTINAL A:  No active issues. P:   - Hold TF for now.  HEMATOLOGIC A:  No active issues. P:  - GI and DVT prophylaxis.  INFECTIOUS A:  No active issues. P:   - Monitor WBC and fever curve.  ENDOCRINE A:  No active issues.   P:   - Monitor.  NEUROLOGIC A:  Anoxic brain injury post suicide attempt by hanging.  EEG shows no seizure activity but signs of anoxic injury.   P:   - Per neuro. - If seizure activity start will consider keppra.  TODAY'S SUMMARY: Spoke with wife extensively, the patient has made his wishes perfectly clear that he did not want aggressive resuscitation.  Based  on that, and after further discussion with neuro, decision was made to make patient and full NCB with no trach/peg, once neuro concludes their work and if patient does not go back to normal then will recommend terminal extubation.  I have personally obtained a history, examined the patient, evaluated laboratory and imaging results, formulated the assessment and plan and placed orders.  CRITICAL CARE: The patient is critically ill with multiple organ systems failure and requires high complexity decision making for assessment and support, frequent evaluation and titration of therapies, application of advanced monitoring technologies and extensive interpretation of multiple databases. Critical Care Time devoted to patient care services described in this note is 90 minutes.   Alyson Reedy, M.D. Pulmonary and Critical Care Medicine [facility] Pager: [phone]  08/25/2013, 1:29 PM

## 2013-08-25 NOTE — Progress Notes (Signed)
Chaplain Note: Found pt's wife and other family members in Consult A.  Pt's wife pretty upset and crying. Stated she cannot get the image of her husband hanging like that.  I comforted pt's wife and provided spiritual and emotional support. Prayed with family at their request.  Rutherford Nail  Chaplain

## 2013-08-25 NOTE — ED Notes (Signed)
EEG techs at bedside after returning from CT Scan.

## 2013-08-25 NOTE — Consult Note (Signed)
Neurology Consultation Reason for Consult: Concern for seizure Referring Physician: Juleen China, S  CC: Concern for seizure  History is obtained from: Medical record  HPI: Anthony Skinner is a 72 y.o. male with a history of recent diagnosis of Alzheimer's who was found hanging by his wife. He is likely not hanging for long period of presentation he had extensor posturing with rhythmic twitching. It did seem that this was elicitable with plantar dorsiflexion which would be an argument for clonus and posturing.  The character of the movements, however was unusual and therefore seizure was considered and the patient was treated with Ativan as well as fosphenytoin.    ROS: Comatose Past Medical History  Diagnosis Date  . Hypertension   . Cancer   . Acute MI     Family History: Comatose  Social History: Tob: Comatose  Exam: Current vital signs: BP 99/49  Pulse 84  Temp(Src) 99.9 F (37.7 C) (Core (Comment))  Resp 17  Ht 5\' 11"  (1.803 m)  Wt 67.1 kg (147 lb 14.9 oz)  BMI 20.64 kg/m2  SpO2 100% Vital signs in last 24 hours: Temp:  [98.8 F (37.1 C)-99.9 F (37.7 C)] 99.9 F (37.7 C) (09/10 2000) Pulse Rate:  [82-120] 84 (09/10 2000) Resp:  [13-29] 17 (09/10 2000) BP: (85-139)/(42-105) 99/49 mmHg (09/10 2000) SpO2:  [98 %-100 %] 100 % (09/10 2000) FiO2 (%):  [30 %-100 %] 30 % (09/10 2000) Weight:  [67.1 kg (147 lb 14.9 oz)-68.04 kg (150 lb)] 67.1 kg (147 lb 14.9 oz) (09/10 1500)  General: In bed, extensor posturing with rhythmic movements CV: Regular rate and rhythm Mental Status: Patient does not respond to verbal stimuli.  Does not respond to deep sternal rub.  Does not follow commands.  No verbalizations are noted.   Cranial Nerves: II: patient does not respond confrontation bilaterally, pupils right 4 mm, left 4 mm,and reactive bilaterally III,IV,VI: Not tested due to c-collar V,VII: corneal reflex present bilaterally  VIII: patient does not respond to verbal  stimuli IX,X: gag reflex not tested as patient was not intubated on my initial assessment XI: unable to test bilaterally due to coma XII: unable to test due to coma  Motor: Extremities flaccid throughout.  No spontaneous movement noted.  No purposeful movements noted.  He has extensor posturing with rhythmic activity.  Sensory: Does not respond to noxious stimuli in any extremity.  Deep Tendon Reflexes:  Clonus present bilaterally  Plantars: equivocal bilaterally  Cerebellar: Unable to perform due to coma  Gait: Unable to perform due to coma  I have reviewed labs in epic and the results pertinent to this consultation are: Leukocytosis  I have reviewed the images obtained: CT head-negative  EEG revealed no ongoing seizure activity  Impression: 72 year old male with likely anoxic brain injury status post hanging. I suspect the movements are seen on admission her posturing. EEG did not capture these events, however given that he was still having them at the time of intubation I would've expected there to be continued seizure activity if this is what it was. This is not completely excluded, however.  Recommendations: 1) MRI brain may be helpful for prognosis if the patient does not improve by tomorrow. 2) if these movements are recurrent consider repeat EEG. 3) do not feel strongly about continuing antiepileptics at this time. 4) will continue to follow with you.   Ritta Slot, MD [facility] [phone]  If 7pm- 7am, please page neurology on call at [phone].

## 2013-08-25 NOTE — ED Notes (Signed)
Results of lactic acid shown to Dr. Maree Krabbe

## 2013-08-25 NOTE — Progress Notes (Signed)
Chaplain was paged to ED to meet with family of patient.  Chaplain waited for the family in the waiting area and escorted them to consultation room A.  From that point on, chaplain provided spiritual care, grief support, communication sharing between staff and family, and hospitality.  After speaking with the patient for some time, chaplain ended the visit with a prayer.    08/25/13 1145  Clinical Encounter Type  Visited With Family  Visit Type ED;Spiritual support  Referral From Nurse  Consult/Referral To Chaplain  Spiritual Encounters  Spiritual Needs Emotional;Grief support;Prayer  Stress Factors  Patient Stress Factors None identified  Family Stress Factors Exhausted;Loss;Major life changes  [facility] Sheral Apley [phone]

## 2013-08-25 NOTE — Progress Notes (Signed)
Stat EEG completed. Dr Kirkpatrick aware. 

## 2013-08-25 NOTE — ED Notes (Signed)
Per EMS pt from home, found by wife after suicidal attempt. Found unresponsive in utility shed hanging by rope. Pt immediately cut down, but remained unresponsive. EMS unable to get BP. HR 120 O2 100% on NRB. CBG 139. Unsure of last seen normal prior to hanging. IV 18G L upper arm. Hx of depression. Recent diagnosis of alzheimers. Pt in c-collar.

## 2013-08-25 NOTE — ED Notes (Signed)
Dr Wyatt at bedside.  

## 2013-08-26 ENCOUNTER — Inpatient Hospital Stay (HOSPITAL_COMMUNITY): Payer: Medicare Other

## 2013-08-26 ENCOUNTER — Encounter (HOSPITAL_COMMUNITY): Payer: Self-pay | Admitting: *Deleted

## 2013-08-26 LAB — BLOOD GAS, ARTERIAL
Acid-Base Excess: 0.4 mmol/L (ref 0.0–2.0)
Bicarbonate: 24.8 mEq/L — ABNORMAL HIGH (ref 20.0–24.0)
Drawn by: 34767
FIO2: 30 %
MECHVT: 530 mL
O2 Saturation: 99.3 %
PEEP: 5 cmH2O
Patient temperature: 98.6
RATE: 16 resp/min
TCO2: 26.1 mmol/L (ref 0–100)
pCO2 arterial: 41.9 mmHg (ref 35.0–45.0)
pH, Arterial: 7.389 (ref 7.350–7.450)
pO2, Arterial: 130 mmHg — ABNORMAL HIGH (ref 80.0–100.0)

## 2013-08-26 LAB — BASIC METABOLIC PANEL
BUN: 9 mg/dL (ref 6–23)
CO2: 23 mEq/L (ref 19–32)
Calcium: 8.5 mg/dL (ref 8.4–10.5)
Chloride: 104 mEq/L (ref 96–112)
Creatinine, Ser: 0.79 mg/dL (ref 0.50–1.35)
GFR calc Af Amer: >90 mL/min (ref >90)
GFR calc non Af Amer: 89 mL/min — ABNORMAL LOW (ref >90)
Glucose, Bld: 114 mg/dL — ABNORMAL HIGH (ref 70–99)
Potassium: 3.9 mEq/L (ref 3.5–5.1)
Sodium: 136 mEq/L (ref 135–145)

## 2013-08-26 LAB — CBC
HCT: 38.7 % — ABNORMAL LOW (ref 39.0–52.0)
Hemoglobin: 13.1 g/dL (ref 13.0–17.0)
MCH: 28.4 pg (ref 26.0–34.0)
MCHC: 33.9 g/dL (ref 30.0–36.0)
MCV: 83.9 fL (ref 78.0–100.0)
Platelets: 188 10*3/uL (ref 150–400)
RBC: 4.61 MIL/uL (ref 4.22–5.81)
RDW: 15.1 % (ref 11.5–15.5)
WBC: 8.9 10*3/uL (ref 4.0–10.5)

## 2013-08-26 LAB — MAGNESIUM: Magnesium: 1.9 mg/dL (ref 1.5–2.5)

## 2013-08-26 LAB — PHOSPHORUS: Phosphorus: 2.3 mg/dL (ref 2.3–4.6)

## 2013-08-26 NOTE — Progress Notes (Signed)
SLP orders received, per RN, patient has just been extubated. Will f/u either late this pm or in am 9/12.   Ferdinand Lango MA, CCC-SLP [phone]

## 2013-08-26 NOTE — Progress Notes (Addendum)
Fentanyl 250 cc's wasted from a 250 cc Bag. Witnessed per two RN's.   Clancy Gourd RN

## 2013-08-26 NOTE — Progress Notes (Signed)
Nutrition Brief Note  Patient identified on the Malnutrition Screening Tool (MST) Report  Wt Readings from Last 15 Encounters:  08/26/13 150 lb 2.1 oz (68.1 kg)    Body mass index is 20.95 kg/(m^2). Patient meets criteria for wnl based on current BMI.   Current diet order is NPO. Labs and medications reviewed.   Pt MST screen inaccurate.  Pt extubated this AM. No nutrition interventions warranted at this time. If nutrition issues arise, please consult RD. RD to follow for diet advancement.  Loyce Dys, MS RD LDN Clinical Inpatient Dietitian Pager: [phone] Weekend/After hours pager: [phone]

## 2013-08-26 NOTE — Progress Notes (Signed)
NEURO HOSPITALIST PROGRESS NOTE   SUBJECTIVE:                                                                                                                        Intubated, on propofol + fentanyl. He is able to open his eyes upon verbal commands and tries to follow commands. No further seizure-like movements noted.    OBJECTIVE:                                                                                                                           Vital signs in last 24 hours: Temp:  [98.8 F (37.1 C)-100.8 F (38.2 C)] 100 F (37.8 C) (09/11 0700) Pulse Rate:  [69-120] 71 (09/11 0700) Resp:  [13-29] 16 (09/11 0700) BP: (85-139)/(40-105) 104/40 mmHg (09/11 0700) SpO2:  [98 %-100 %] 100 % (09/11 0700) FiO2 (%):  [30 %-100 %] 30 % (09/11 0412) Weight:  [67.1 kg (147 lb 14.9 oz)-68.1 kg (150 lb 2.1 oz)] 68.1 kg (150 lb 2.1 oz) (09/11 0500)  Intake/Output from previous day: 09/10 0701 - 09/11 0700 In: 1614 [I.V.:1614] Out: 1000 [Urine:1000] Intake/Output this shift:   Nutritional status: NPO  Past Medical History  Diagnosis Date  . Hypertension   . Cancer   . Acute MI      Neurologic Exam:  Mental status: sedated and intubated, but able to open eyes. CN 2-12: pupils 2-3 mm bilaterally, reactive to light. No gaze preference. EOM full without nystagmus. Face is symmetric. Tongue midline. Motor: moves all limbs spontaneously and symmetrically. Sensory: reacts to pain. DTR's: 2 all over. Plantars: downgoing  Lab Results: No results found for this basename: cbc, bmp, coags, chol, tri, ldl, hga1c   Lipid Panel No results found for this basename: CHOL, TRIG, HDL, CHOLHDL, VLDL, LDLCALC,  in the last 72 hours  Studies/Results: Ct Head Wo Contrast  08/25/2013   CLINICAL DATA:  Unresponsive, found hanging  EXAM: CT HEAD WITHOUT CONTRAST  CT CERVICAL SPINE WITHOUT CONTRAST  TECHNIQUE: Multidetector CT imaging of the head and cervical  spine was performed following the standard protocol without intravenous contrast. Multiplanar CT image reconstructions of the cervical spine were also generated.  COMPARISON:  None  FINDINGS: CT HEAD FINDINGS  Normal ventricular morphology. No midline shift or mass effect. Normal appearance of brain parenchyma. No intracranial hemorrhage, mass lesion, or acute infarction. Visualized paranasal sinuses and mastoid air cells clear. Bones unremarkable.  CT CERVICAL SPINE FINDINGS  Endotracheal tube within the trachea.  Prevertebral soft tissues normal thickness.  Visualized skull base intact.  Orogastric tube traverses cervical esophagus.  Disc space narrowing with endplate spur formation at C4-C5, C5-C6, and C6-C7, minimally at C3-C4.  Vertebral body heights maintained without fracture or subluxation.  C1-C2 alignment normal.  Biapical lung scarring greater on left. No definite soft tissue abnormalities.  IMPRESSION: CT HEAD IMPRESSION  No acute intracranial abnormalities.  CT CERVICAL SPINE IMPRESSION  No acute cervical spine abnormalities.  Multilevel degenerative disc disease changes cervical spine.   Electronically Signed   By: Ulyses Southward M.D.   On: 08/25/2013 12:26   Ct Cervical Spine Wo Contrast  08/25/2013   CLINICAL DATA:  Unresponsive, found hanging  EXAM: CT HEAD WITHOUT CONTRAST  CT CERVICAL SPINE WITHOUT CONTRAST  TECHNIQUE: Multidetector CT imaging of the head and cervical spine was performed following the standard protocol without intravenous contrast. Multiplanar CT image reconstructions of the cervical spine were also generated.  COMPARISON:  None  FINDINGS: CT HEAD FINDINGS  Normal ventricular morphology. No midline shift or mass effect. Normal appearance of brain parenchyma. No intracranial hemorrhage, mass lesion, or acute infarction. Visualized paranasal sinuses and mastoid air cells clear. Bones unremarkable.  CT CERVICAL SPINE FINDINGS  Endotracheal tube within the trachea.  Prevertebral soft  tissues normal thickness.  Visualized skull base intact.  Orogastric tube traverses cervical esophagus.  Disc space narrowing with endplate spur formation at C4-C5, C5-C6, and C6-C7, minimally at C3-C4.  Vertebral body heights maintained without fracture or subluxation.  C1-C2 alignment normal.  Biapical lung scarring greater on left. No definite soft tissue abnormalities.  IMPRESSION: CT HEAD IMPRESSION  No acute intracranial abnormalities.  CT CERVICAL SPINE IMPRESSION  No acute cervical spine abnormalities.  Multilevel degenerative disc disease changes cervical spine.   Electronically Signed   By: Ulyses Southward M.D.   On: 08/25/2013 12:26   Dg Chest Port 1 View  08/26/2013   *RADIOLOGY REPORT*  Clinical Data: Evaluate endotracheal tube position  PORTABLE CHEST - 1 VIEW  Comparison: Portable chest x-ray of 08/25/2013  Findings: The tip of the endotracheal tube is approximately 4.0 cm above the carina.  The lungs appear well aerated.  No infiltrate or effusion is seen.  A calcified granuloma again is noted in the right lung base.  Heart size is stable.  IMPRESSION: Endotracheal tube tip 4.0 cm above the carina.  No active lung disease.   Original Report Authenticated By: Dwyane Dee, M.D.   Dg Chest Portable 1 View  08/25/2013   *RADIOLOGY REPORT*  Clinical Data: Intubated  PORTABLE CHEST - 1 VIEW  Comparison: Portable chest x-ray of 08/25/2013  Findings: The tip of the endotracheal tube is approximately 2.9 cm above the carina. The lungs appear clear.  An NG tube is present. The heart is within normal limits in size.  IMPRESSION:  1.  Endotracheal tube tip 2.9 cm above the carina. 2.  No active lung disease.   Original Report Authenticated By: Dwyane Dee, M.D.   Dg Chest Portable 1 View  08/25/2013   *RADIOLOGY REPORT*  Clinical Data: Altered mental status, uncontrollable shaking  PORTABLE CHEST - 1 VIEW  Comparison: None.  Findings: No active infiltrate or effusion is seen.  A probable calcified granuloma  is noted at the right lung base.  Mediastinal contours appear normal.  The heart is within normal limits in size. No bony abnormality is seen.  IMPRESSION: No active lung disease.   Original Report Authenticated By: Dwyane Dee, M.D.    MEDICATIONS                                                                                                                       I have reviewed the patient's current medications.  ASSESSMENT/PLAN:                                                                                                           Anoxic brain injury post suicide attempt by hanging. He seems to be doing clinically better at this moment. Family requesting MRI brain and will follow their wishes.  Wyatt Portela, MD [facility] [phone]  08/26/2013, 8:13 AM

## 2013-08-26 NOTE — Progress Notes (Signed)
Utilization review completed. Oma Marzan, RN, BSN. 

## 2013-08-26 NOTE — Evaluation (Signed)
Clinical/Bedside Swallow Evaluation Patient Details  Name: Anthony Skinner MRN: [id number] Date of Birth: 16-Nov-1941  Today's Date: 08/26/2013 Time: 4098-1191 SLP Time Calculation (min): 13 min  Past Medical History:  Past Medical History  Diagnosis Date  . Hypertension   . Cancer   . Acute MI    Past Surgical History:  Past Surgical History  Procedure Laterality Date  . Cardiac surgery     HPI:  72 year old male with recent diagnosis of Alzheimer disease and subsequent depression, found by wife in shed on morning of admission hanging by a rope.  EMS was called and patient had a pulse but was not protecting his airway.  Patient was brought to the ED where he was intubated and PCCM was called to admit. MD reports anoxic brain injury.    Assessment / Plan / Recommendation Clinical Impression  Pt demonstrates normal swallow function. There was consistent belching following large sips of water, but not concerning to safety with POs. Pt edentulous, but family will bring dentures tonight. Recommend Regular diet/thin liquids with basic aspiration precautions. No SLP f/u recommended.     Aspiration Risk  Mild    Diet Recommendation Regular;Thin liquid   Liquid Administration via: Cup;Straw Medication Administration: Whole meds with liquid Supervision: Patient able to self feed Postural Changes and/or Swallow Maneuvers: Seated upright 90 degrees    Other  Recommendations Oral Care Recommendations: Oral care BID   Follow Up Recommendations  None    Frequency and Duration        Pertinent Vitals/Pain NA    SLP Swallow Goals     Swallow Study Prior Functional Status       General HPI: 72 year old male with recent diagnosis of Alzheimer disease and subsequent depression, found by wife in shed on morning of admission hanging by a rope.  EMS was called and patient had a pulse but was not protecting his airway.  Patient was brought to the ED where he was intubated and PCCM was  called to admit. MD reports anoxic brain injury.  Type of Study: Bedside swallow evaluation Diet Prior to this Study: NPO History of Recent Intubation: Yes Length of Intubations (days): 2 days Date extubated: 08/26/13 Behavior/Cognition: Alert;Cooperative;Pleasant mood;Confused Oral Cavity - Dentition: Edentulous (family bringing dentures tonight) Self-Feeding Abilities: Able to feed self;Needs assist Patient Positioning: Upright in bed Baseline Vocal Quality: Hoarse Volitional Cough: Strong Volitional Swallow: Able to elicit    Oral/Motor/Sensory Function Overall Oral Motor/Sensory Function: Appears within functional limits for tasks assessed   Ice Chips     Thin Liquid Thin Liquid: Within functional limits Presentation: Cup;Straw;Self Fed    Nectar Thick Nectar Thick Liquid: Not tested   Honey Thick Honey Thick Liquid: Not tested   Puree Puree: Within functional limits   Solid   GO    Solid: Within functional limits      [facility], [place] CCC-SLP [phone]  Claudine Mouton 08/26/2013,3:45 PM

## 2013-08-26 NOTE — Procedures (Signed)
Extubation Procedure Note  Patient Details:   Name: Anthony Skinner DOB: 1941-04-27 MRN: [id number]   Airway Documentation:   Patient weaning from vent support on %cm Pressure Support and 5cm PEEP. He tolerated this well. Patient extubated and is now wearing nasal cannula with oxygen running through at 4lpm. Will wean O2 to have sats 88-92%  Evaluation  O2 sats: stable throughout Complications: No apparent complications Patient did tolerate procedure well. Bilateral Breath Sounds: Clear;Diminished Suctioning: Airway Yes  Clearance Coots 08/26/2013, 10:53 AM

## 2013-08-26 NOTE — Progress Notes (Signed)
PULMONARY  / CRITICAL CARE MEDICINE  Name: Anthony Skinner MRN: [id number] DOB: 1941-09-12    ADMISSION DATE:  08/25/2013 CONSULTATION DATE:  08/25/2013  REFERRING MD :  Juleen China, EDP. PRIMARY SERVICE: PCCM  CHIEF COMPLAINT:  VDRF and anoxic brain injury.  BRIEF PATIENT DESCRIPTION: 72 year old male with recent diagnosis of Alzheimer disease and subsequent depression, found by wife in shed on morning of admission hanging by a rope.  EMS was called and patient had a pulse but was not protecting his airway.  Patient was brought to the ED where he was intubated and PCCM was called to admit.  SIGNIFICANT EVENTS / STUDIES:  9/10>>>Suicide attempt by hanging.  LINES / TUBES: ETT 9/10>>>9/11 PIV Foley 9/10>>>  CULTURES: None  ANTIBIOTICS: None  SUBJECTIVE: Alert and interactive, following all commands.  VITAL SIGNS: Temp:  [98.8 F (37.1 C)-100.8 F (38.2 C)] 100 F (37.8 C) (09/11 0900) Pulse Rate:  [69-120] 74 (09/11 0900) Resp:  [13-29] 16 (09/11 0900) BP: (85-139)/(40-105) 115/51 mmHg (09/11 0900) SpO2:  [98 %-100 %] 100 % (09/11 0900) FiO2 (%):  [30 %-100 %] 30 % (09/11 0929) Weight:  [67.1 kg (147 lb 14.9 oz)-68.1 kg (150 lb 2.1 oz)] 68.1 kg (150 lb 2.1 oz) (09/11 0500)  HEMODYNAMICS:  VSS-AF.  VENTILATOR SETTINGS: Vent Mode:  [-] PRVC FiO2 (%):  [30 %-100 %] 30 % Set Rate:  [16 bmp] 16 bmp Vt Set:  [460 mL-530 mL] 530 mL PEEP:  [5 cmH20] 5 cmH20 Plateau Pressure:  [11 cmH20-15 cmH20] 15 cmH20 INTAKE / OUTPUT: Intake/Output     09/10 0701 - 09/11 0700 09/11 0701 - 09/12 0700   I.V. (mL/kg) 1720.6 (25.3) 304.1 (4.5)   Total Intake(mL/kg) 1720.6 (25.3) 304.1 (4.5)   Urine (mL/kg/hr) 1075 300 (1.3)   Total Output 1075 300   Net +645.6 +4.1         PHYSICAL EXAMINATION: General:  Chronically ill appearing male, sedated and intubated, not moving any ext to command/pain. Neuro:  Decerebrate posturing. HEENT:  [place]/AT, rope bruise noted around the neck.  No corneal or  dolls eye reflexes noted, pupils are sluggish but reactive. Cardiovascular: RRR, Nl S1/S2, -M/R/G. Lungs: CTA bilaterally. Abdomen:  Soft, NT, ND and +BS. Musculoskeletal:  -edema and -tenderness. Skin:  Intact.  LABS:  CBC Recent Labs     08/25/13  1113  08/25/13  1543  08/26/13  0450  WBC  7.4  11.9*  8.9  HGB  14.7  13.7  13.1  HCT  43.3  40.2  38.7*  PLT  261  223  188   Coag's Recent Labs     08/25/13  1113  08/25/13  1543  APTT  29  29  INR   --   1.21   BMET Recent Labs     08/25/13  1113  08/25/13  1543  08/26/13  0450  NA  135  133*  136  K  3.8  4.1  3.9  CL  99  100  104  CO2  20  23  23   BUN  12  12  9   CREATININE  1.09  1.01  0.79  GLUCOSE  152*  197*  114*   Electrolytes Recent Labs     08/25/13  1113  08/25/13  1543  08/25/13  1544  08/26/13  0450  CALCIUM  9.3  8.3*   --   8.5  MG  2.1   --   1.8  1.9  PHOS   --    --   2.8  2.3   Sepsis Markers No results found for this basename: LACTICACIDVEN, PROCALCITON, O2SATVEN,  in the last 72 hours ABG Recent Labs     08/25/13  1123  08/25/13  1401  08/26/13  0500  PHART  7.340*  7.292*  7.389  PCO2ART  38.1  48.3*  41.9  PO2ART  203.0*  132.0*  130.0*   Liver Enzymes Recent Labs     08/25/13  1113  08/25/13  1543  AST  20  23  ALT  12  11  ALKPHOS  70  60  BILITOT  0.5  0.5  ALBUMIN  4.0  3.5   Cardiac Enzymes No results found for this basename: TROPONINI, PROBNP,  in the last 72 hours Glucose Recent Labs     08/25/13  1114  GLUCAP  135*   Imaging Ct Head Wo Contrast  08/25/2013   CLINICAL DATA:  Unresponsive, found hanging  EXAM: CT HEAD WITHOUT CONTRAST  CT CERVICAL SPINE WITHOUT CONTRAST  TECHNIQUE: Multidetector CT imaging of the head and cervical spine was performed following the standard protocol without intravenous contrast. Multiplanar CT image reconstructions of the cervical spine were also generated.  COMPARISON:  None  FINDINGS: CT HEAD FINDINGS  Normal  ventricular morphology. No midline shift or mass effect. Normal appearance of brain parenchyma. No intracranial hemorrhage, mass lesion, or acute infarction. Visualized paranasal sinuses and mastoid air cells clear. Bones unremarkable.  CT CERVICAL SPINE FINDINGS  Endotracheal tube within the trachea.  Prevertebral soft tissues normal thickness.  Visualized skull base intact.  Orogastric tube traverses cervical esophagus.  Disc space narrowing with endplate spur formation at C4-C5, C5-C6, and C6-C7, minimally at C3-C4.  Vertebral body heights maintained without fracture or subluxation.  C1-C2 alignment normal.  Biapical lung scarring greater on left. No definite soft tissue abnormalities.  IMPRESSION: CT HEAD IMPRESSION  No acute intracranial abnormalities.  CT CERVICAL SPINE IMPRESSION  No acute cervical spine abnormalities.  Multilevel degenerative disc disease changes cervical spine.   Electronically Signed   By: Ulyses Southward M.D.   On: 08/25/2013 12:26   Ct Cervical Spine Wo Contrast  08/25/2013   CLINICAL DATA:  Unresponsive, found hanging  EXAM: CT HEAD WITHOUT CONTRAST  CT CERVICAL SPINE WITHOUT CONTRAST  TECHNIQUE: Multidetector CT imaging of the head and cervical spine was performed following the standard protocol without intravenous contrast. Multiplanar CT image reconstructions of the cervical spine were also generated.  COMPARISON:  None  FINDINGS: CT HEAD FINDINGS  Normal ventricular morphology. No midline shift or mass effect. Normal appearance of brain parenchyma. No intracranial hemorrhage, mass lesion, or acute infarction. Visualized paranasal sinuses and mastoid air cells clear. Bones unremarkable.  CT CERVICAL SPINE FINDINGS  Endotracheal tube within the trachea.  Prevertebral soft tissues normal thickness.  Visualized skull base intact.  Orogastric tube traverses cervical esophagus.  Disc space narrowing with endplate spur formation at C4-C5, C5-C6, and C6-C7, minimally at C3-C4.  Vertebral  body heights maintained without fracture or subluxation.  C1-C2 alignment normal.  Biapical lung scarring greater on left. No definite soft tissue abnormalities.  IMPRESSION: CT HEAD IMPRESSION  No acute intracranial abnormalities.  CT CERVICAL SPINE IMPRESSION  No acute cervical spine abnormalities.  Multilevel degenerative disc disease changes cervical spine.   Electronically Signed   By: Ulyses Southward M.D.   On: 08/25/2013 12:26   Dg Chest Port 1 View  08/26/2013   *RADIOLOGY REPORT*  Clinical Data: Evaluate endotracheal tube position  PORTABLE CHEST - 1 VIEW  Comparison: Portable chest x-ray of 08/25/2013  Findings: The tip of the endotracheal tube is approximately 4.0 cm above the carina.  The lungs appear well aerated.  No infiltrate or effusion is seen.  A calcified granuloma again is noted in the right lung base.  Heart size is stable.  IMPRESSION: Endotracheal tube tip 4.0 cm above the carina.  No active lung disease.   Original Report Authenticated By: Dwyane Dee, M.D.   Dg Chest Portable 1 View  08/25/2013   *RADIOLOGY REPORT*  Clinical Data: Intubated  PORTABLE CHEST - 1 VIEW  Comparison: Portable chest x-ray of 08/25/2013  Findings: The tip of the endotracheal tube is approximately 2.9 cm above the carina. The lungs appear clear.  An NG tube is present. The heart is within normal limits in size.  IMPRESSION:  1.  Endotracheal tube tip 2.9 cm above the carina. 2.  No active lung disease.   Original Report Authenticated By: Dwyane Dee, M.D.   Dg Chest Portable 1 View  08/25/2013   *RADIOLOGY REPORT*  Clinical Data: Altered mental status, uncontrollable shaking  PORTABLE CHEST - 1 VIEW  Comparison: None.  Findings: No active infiltrate or effusion is seen.  A probable calcified granuloma is noted at the right lung base.  Mediastinal contours appear normal.  The heart is within normal limits in size. No bony abnormality is seen.  IMPRESSION: No active lung disease.   Original Report Authenticated By:  Dwyane Dee, M.D.   CXR: Clear  ASSESSMENT / PLAN:  PULMONARY A: VDRF due to inability to protect his airway after a suicide attempt.  Much more alert and interactive today. P:   - SBT to extubate today. - No intention to reintubate if fails extubation and family is ok with that as he has discussed this with them before and further aggressive care is not his wishes. - IS per RT protocol. - Titrate O2.  CARDIOVASCULAR A: HTN by history, stable BP for now. P:  - D/C propofol. - DNR. - Will consider beta blockers if HR increases.  RENAL A:  No active issues. P:   - BMET in AM. - Replace electrolytes as needed.  GASTROINTESTINAL A:  No active issues. P:   - Swallow evaluation. - Diet per respiratory.  HEMATOLOGIC A:  No active issues. P:  - GI and DVT prophylaxis.  INFECTIOUS A:  No active issues. P:   - Monitor WBC and fever curve.  ENDOCRINE A:  No active issues.   P:   - Monitor.  NEUROLOGIC A:  Anoxic brain injury post suicide attempt by hanging.  Neuro status much improved this AM.   P:   - Per neuro. - If seizure activity start will consider keppra.  TODAY'S SUMMARY: Neuro status is much improved this AM, weaning well, ready to extubate, spoke with wife, will extubate with no intention to reintubate, confirmed full DNR.  I have personally obtained a history, examined the patient, evaluated laboratory and imaging results, formulated the assessment and plan and placed orders.  CRITICAL CARE: The patient is critically ill with multiple organ systems failure and requires high complexity decision making for assessment and support, frequent evaluation and titration of therapies, application of advanced monitoring technologies and extensive interpretation of multiple databases. Critical Care Time devoted to patient care services described in this note is 35 minutes.   Alyson Reedy, M.D. Pulmonary and Critical Care Medicine [facility]  Pager: [phone]  08/26/2013, 10:16 AM

## 2013-08-27 ENCOUNTER — Encounter (HOSPITAL_COMMUNITY): Payer: Self-pay

## 2013-08-27 DIAGNOSIS — F39 Unspecified mood [affective] disorder: Secondary | ICD-10-CM

## 2013-08-27 DIAGNOSIS — J9600 Acute respiratory failure, unspecified whether with hypoxia or hypercapnia: Secondary | ICD-10-CM

## 2013-08-27 DIAGNOSIS — I10 Essential (primary) hypertension: Secondary | ICD-10-CM

## 2013-08-27 DIAGNOSIS — X838XXA Intentional self-harm by other specified means, initial encounter: Secondary | ICD-10-CM

## 2013-08-27 DIAGNOSIS — R4182 Altered mental status, unspecified: Secondary | ICD-10-CM

## 2013-08-27 LAB — BASIC METABOLIC PANEL
BUN: 9 mg/dL (ref 6–23)
CO2: 25 mEq/L (ref 19–32)
Calcium: 8.4 mg/dL (ref 8.4–10.5)
Chloride: 108 mEq/L (ref 96–112)
Creatinine, Ser: 0.84 mg/dL (ref 0.50–1.35)
GFR calc Af Amer: >90 mL/min (ref >90)
GFR calc non Af Amer: 87 mL/min — ABNORMAL LOW (ref >90)
Glucose, Bld: 94 mg/dL (ref 70–99)
Potassium: 3.5 mEq/L (ref 3.5–5.1)
Sodium: 142 mEq/L (ref 135–145)

## 2013-08-27 LAB — CBC
HCT: 39.1 % (ref 39.0–52.0)
Hemoglobin: 13.7 g/dL (ref 13.0–17.0)
MCH: 29.3 pg (ref 26.0–34.0)
MCHC: 35 g/dL (ref 30.0–36.0)
MCV: 83.5 fL (ref 78.0–100.0)
Platelets: 166 10*3/uL (ref 150–400)
RBC: 4.68 MIL/uL (ref 4.22–5.81)
RDW: 15 % (ref 11.5–15.5)
WBC: 7.2 10*3/uL (ref 4.0–10.5)

## 2013-08-27 LAB — MAGNESIUM: Magnesium: 2 mg/dL (ref 1.5–2.5)

## 2013-08-27 LAB — PHOSPHORUS: Phosphorus: 1.7 mg/dL — ABNORMAL LOW (ref 2.3–4.6)

## 2013-08-27 MED ORDER — PANTOPRAZOLE SODIUM 40 MG PO TBEC
40.0000 mg | DELAYED_RELEASE_TABLET | Freq: Every day | ORAL | Status: DC
  Administered 2013-08-27 – 2013-08-31 (×5): 40 mg via ORAL
  Filled 2013-08-27 (×5): qty 1

## 2013-08-27 MED ORDER — INFLUENZA VAC SPLIT QUAD 0.5 ML IM SUSP
0.5000 mL | INTRAMUSCULAR | Status: AC
  Administered 2013-08-28: 0.5 mL via INTRAMUSCULAR
  Filled 2013-08-27: qty 0.5

## 2013-08-27 MED ORDER — K PHOS MONO-SOD PHOS DI & MONO 155-852-130 MG PO TABS
500.0000 mg | ORAL_TABLET | Freq: Two times a day (BID) | ORAL | Status: AC
  Administered 2013-08-27 – 2013-08-28 (×4): 500 mg via ORAL
  Filled 2013-08-27 (×5): qty 2

## 2013-08-27 NOTE — Progress Notes (Signed)
Report given to [place] on [address] at 1230. All questions answered. Patients family notified of room transfer and new room location. Wife, Son, and Daughter present at time of transfer.

## 2013-08-27 NOTE — Progress Notes (Signed)
PULMONARY  / CRITICAL CARE MEDICINE  Name: Anthony Skinner MRN: [id number] DOB: 24-Mar-1941    ADMISSION DATE:  08/25/2013 CONSULTATION DATE:  08/25/2013  REFERRING MD :  Juleen China, EDP. PRIMARY SERVICE: PCCM  CHIEF COMPLAINT:  VDRF and anoxic brain injury.  BRIEF PATIENT DESCRIPTION: 72 year old male with recent diagnosis of Alzheimer disease and subsequent depression, found by wife in shed on morning of admission hanging by a rope.  EMS was called and patient had a pulse but was not protecting his airway.  Patient was brought to the ED where he was intubated and PCCM was called to admit.  SIGNIFICANT EVENTS / STUDIES:  9/10>>>Suicide attempt by hanging.  LINES / TUBES: ETT 9/10>>>9/11 PIV Foley 9/10>>>  CULTURES: None  ANTIBIOTICS: None  SUBJECTIVE: Alert and interactive, following all commands.  VITAL SIGNS: Temp:  [98.8 F (37.1 C)-100.2 F (37.9 C)] 98.8 F (37.1 C) (09/12 0752) Pulse Rate:  [68-98] 73 (09/12 1000) Resp:  [0-22] 18 (09/12 1000) BP: (96-136)/(39-63) 119/47 mmHg (09/12 1000) SpO2:  [94 %-100 %] 99 % (09/12 1000) Weight:  [67.8 kg (149 lb 7.6 oz)] 67.8 kg (149 lb 7.6 oz) (09/12 0430)  HEMODYNAMICS:  VSS-AF.  VENTILATOR SETTINGS:   INTAKE / OUTPUT: Intake/Output     09/11 0701 - 09/12 0700 09/12 0701 - 09/13 0700   P.O.  360   I.V. (mL/kg) 2604.1 (38.4) 300 (4.4)   Total Intake(mL/kg) 2604.1 (38.4) 660 (9.7)   Urine (mL/kg/hr) 2725 (1.7)    Total Output 2725     Net -120.9 +660        Urine Occurrence 1 x 1 x    PHYSICAL EXAMINATION: General:  Chronically ill appearing male, alert and interactive, moving all ext to command. Neuro: Alert and interactive, moving all ext to command. HEENT:  [place]/AT, rope bruise noted around the neck. Cardiovascular: RRR, Nl S1/S2, -M/R/G. Lungs: CTA bilaterally. Abdomen:  Soft, NT, ND and +BS. Musculoskeletal:  -edema and -tenderness. Skin:  Intact.  LABS:  CBC Recent Labs     08/25/13  1543  08/26/13  0450   08/27/13  0430  WBC  11.9*  8.9  7.2  HGB  13.7  13.1  13.7  HCT  40.2  38.7*  39.1  PLT  223  188  166   Coag's Recent Labs     08/25/13  1113  08/25/13  1543  APTT  29  29  INR   --   1.21   BMET Recent Labs     08/25/13  1543  08/26/13  0450  08/27/13  0430  NA  133*  136  142  K  4.1  3.9  3.5  CL  100  104  108  CO2  23  23  25   BUN  12  9  9   CREATININE  1.01  0.79  0.84  GLUCOSE  197*  114*  94   Electrolytes Recent Labs     08/25/13  1543  08/25/13  1544  08/26/13  0450  08/27/13  0430  CALCIUM  8.3*   --   8.5  8.4  MG   --   1.8  1.9  2.0  PHOS   --   2.8  2.3  1.7*   Sepsis Markers No results found for this basename: LACTICACIDVEN, PROCALCITON, O2SATVEN,  in the last 72 hours ABG Recent Labs     08/25/13  1123  08/25/13  1401  08/26/13  0500  PHART  7.340*  7.292*  7.389  PCO2ART  38.1  48.3*  41.9  PO2ART  203.0*  132.0*  130.0*   Liver Enzymes Recent Labs     08/25/13  1113  08/25/13  1543  AST  20  23  ALT  12  11  ALKPHOS  70  60  BILITOT  0.5  0.5  ALBUMIN  4.0  3.5   Cardiac Enzymes No results found for this basename: TROPONINI, PROBNP,  in the last 72 hours Glucose Recent Labs     08/25/13  1114  GLUCAP  135*   Imaging Ct Head Wo Contrast  08/25/2013   CLINICAL DATA:  Unresponsive, found hanging  EXAM: CT HEAD WITHOUT CONTRAST  CT CERVICAL SPINE WITHOUT CONTRAST  TECHNIQUE: Multidetector CT imaging of the head and cervical spine was performed following the standard protocol without intravenous contrast. Multiplanar CT image reconstructions of the cervical spine were also generated.  COMPARISON:  None  FINDINGS: CT HEAD FINDINGS  Normal ventricular morphology. No midline shift or mass effect. Normal appearance of brain parenchyma. No intracranial hemorrhage, mass lesion, or acute infarction. Visualized paranasal sinuses and mastoid air cells clear. Bones unremarkable.  CT CERVICAL SPINE FINDINGS  Endotracheal tube within the  trachea.  Prevertebral soft tissues normal thickness.  Visualized skull base intact.  Orogastric tube traverses cervical esophagus.  Disc space narrowing with endplate spur formation at C4-C5, C5-C6, and C6-C7, minimally at C3-C4.  Vertebral body heights maintained without fracture or subluxation.  C1-C2 alignment normal.  Biapical lung scarring greater on left. No definite soft tissue abnormalities.  IMPRESSION: CT HEAD IMPRESSION  No acute intracranial abnormalities.  CT CERVICAL SPINE IMPRESSION  No acute cervical spine abnormalities.  Multilevel degenerative disc disease changes cervical spine.   Electronically Signed   By: Ulyses Southward M.D.   On: 08/25/2013 12:26   Ct Cervical Spine Wo Contrast  08/25/2013   CLINICAL DATA:  Unresponsive, found hanging  EXAM: CT HEAD WITHOUT CONTRAST  CT CERVICAL SPINE WITHOUT CONTRAST  TECHNIQUE: Multidetector CT imaging of the head and cervical spine was performed following the standard protocol without intravenous contrast. Multiplanar CT image reconstructions of the cervical spine were also generated.  COMPARISON:  None  FINDINGS: CT HEAD FINDINGS  Normal ventricular morphology. No midline shift or mass effect. Normal appearance of brain parenchyma. No intracranial hemorrhage, mass lesion, or acute infarction. Visualized paranasal sinuses and mastoid air cells clear. Bones unremarkable.  CT CERVICAL SPINE FINDINGS  Endotracheal tube within the trachea.  Prevertebral soft tissues normal thickness.  Visualized skull base intact.  Orogastric tube traverses cervical esophagus.  Disc space narrowing with endplate spur formation at C4-C5, C5-C6, and C6-C7, minimally at C3-C4.  Vertebral body heights maintained without fracture or subluxation.  C1-C2 alignment normal.  Biapical lung scarring greater on left. No definite soft tissue abnormalities.  IMPRESSION: CT HEAD IMPRESSION  No acute intracranial abnormalities.  CT CERVICAL SPINE IMPRESSION  No acute cervical spine  abnormalities.  Multilevel degenerative disc disease changes cervical spine.   Electronically Signed   By: Ulyses Southward M.D.   On: 08/25/2013 12:26   Dg Chest Port 1 View  08/26/2013   *RADIOLOGY REPORT*  Clinical Data: Evaluate endotracheal tube position  PORTABLE CHEST - 1 VIEW  Comparison: Portable chest x-ray of 08/25/2013  Findings: The tip of the endotracheal tube is approximately 4.0 cm above the carina.  The lungs appear well aerated.  No infiltrate or effusion is seen.  A calcified granuloma again  is noted in the right lung base.  Heart size is stable.  IMPRESSION: Endotracheal tube tip 4.0 cm above the carina.  No active lung disease.   Original Report Authenticated By: Dwyane Dee, M.D.   Dg Chest Portable 1 View  08/25/2013   *RADIOLOGY REPORT*  Clinical Data: Intubated  PORTABLE CHEST - 1 VIEW  Comparison: Portable chest x-ray of 08/25/2013  Findings: The tip of the endotracheal tube is approximately 2.9 cm above the carina. The lungs appear clear.  An NG tube is present. The heart is within normal limits in size.  IMPRESSION:  1.  Endotracheal tube tip 2.9 cm above the carina. 2.  No active lung disease.   Original Report Authenticated By: Dwyane Dee, M.D.   Dg Chest Portable 1 View  08/25/2013   *RADIOLOGY REPORT*  Clinical Data: Altered mental status, uncontrollable shaking  PORTABLE CHEST - 1 VIEW  Comparison: None.  Findings: No active infiltrate or effusion is seen.  A probable calcified granuloma is noted at the right lung base.  Mediastinal contours appear normal.  The heart is within normal limits in size. No bony abnormality is seen.  IMPRESSION: No active lung disease.   Original Report Authenticated By: Dwyane Dee, M.D.   CXR: Clear  ASSESSMENT / PLAN:  PULMONARY A: VDRF due to inability to protect his airway after a suicide attempt.  Much more alert and interactive today. P:   - Titrate O2 to off if able. - IS per RT protocol.  CARDIOVASCULAR A: HTN by history, stable BP  for now. P:  - D/C all sedation. - Will consider restarting home anti-HTN if patient's BP increases further.  RENAL A:  No active issues. P:   - BMET in AM. - Replace electrolytes as needed.  GASTROINTESTINAL A:  No active issues. P:   - Diet as ordered.  HEMATOLOGIC A:  No active issues. P:  - GI and DVT prophylaxis.  INFECTIOUS A:  No active issues. P:   - Monitor WBC and fever curve.  ENDOCRINE A:  No active issues.   P:   - Monitor.  NEUROLOGIC A:  Anoxic brain injury post suicide attempt by hanging.  Neuro status much improved this AM.   P:   - Per neuro. - Will defer MRI to neuro at this point. - Psych consult called.  TODAY'S SUMMARY: Alert and interactive, psych consult called, will transfer to med-surg and TRH to pick up on 9/13, PCCM will sign off, please call back if needed.  Alyson Reedy, M.D. Pulmonary and Critical Care Medicine [facility] Pager: [phone]  08/27/2013, 10:18 AM

## 2013-08-27 NOTE — Progress Notes (Signed)
08/27/2013 5:44 PM  Pt arrived to unit via wheelchair around 1315.  Pt accompanied by his family and by suicide sitter.  Room was cleared of any contraband/harmful items.  Guests instructed to take purses and phone charger back to car, to which they complied.  Sitter at bedside and attending to patient.  Vitals stable.  Assessment to EPIC.  Pt does have a burn from where he spilled coffee on him on his left anterior thigh.  Area is reddened and scabbed over, painful to touch.  Pt also has marks around his neck from suicide attempt (hanging).  No other areas of skin breakdown noted.  Pt wife reports a fall within the past six months at home.  Pt is a high fall risk per protocol, unsteady to his feet when ambulating.  Pt placed on bed alarm, and a yellow arm band and red socks were applied.  Pt and family were oriented to room/unit, and were instructed on how to utilize the call bell, to which they verbalized understanding.  Will continue to monitor patient. Theadora Rama

## 2013-08-27 NOTE — Progress Notes (Signed)
NEURO HOSPITALIST PROGRESS NOTE   SUBJECTIVE:                                                                                                                        Doing great, feels back to baseline.  OBJECTIVE:                                                                                                                           Vital signs in last 24 hours: Temp:  [98.8 F (37.1 C)-100.2 F (37.9 C)] 98.8 F (37.1 C) (09/12 0752) Pulse Rate:  [68-98] 73 (09/12 1000) Resp:  [0-22] 18 (09/12 1000) BP: (96-136)/(39-63) 119/47 mmHg (09/12 1000) SpO2:  [94 %-100 %] 99 % (09/12 1000) Weight:  [67.8 kg (149 lb 7.6 oz)] 67.8 kg (149 lb 7.6 oz) (09/12 0430)  Intake/Output from previous day: 09/11 0701 - 09/12 0700 In: 2604.1 [I.V.:2604.1] Out: 2725 [Urine:2725] Intake/Output this shift: Total I/O In: 660 [P.O.:360; I.V.:300] Out: -  Nutritional status: General  Past Medical History  Diagnosis Date  . Hypertension   . Cancer   . Acute MI     Neurologic Exam:  Mental Status: Alert, oriented, thought content appropriate.  Speech fluent without evidence of aphasia.  Able to follow 3 step commands without difficulty. Cranial Nerves: II: Discs flat bilaterally; Visual fields grossly normal, pupils equal, round, reactive to light and accommodation III,IV, VI: ptosis not present, extra-ocular motions intact bilaterally V,VII: smile symmetric, facial light touch sensation normal bilaterally VIII: hearing normal bilaterally IX,X: gag reflex present XI: bilateral shoulder shrug XII: midline tongue extension Motor: Right : Upper extremity   5/5    Left:     Upper extremity   5/5  Lower extremity   5/5     Lower extremity   5/5 Tone and bulk:normal tone throughout; no atrophy noted Sensory: Pinprick and light touch intact throughout, bilaterally Deep Tendon Reflexes:  Right: Upper Extremity   Left: Upper extremity   biceps (C-5 to C-6) 2/4    biceps (C-5 to C-6) 2/4 tricep (C7) 2/4    triceps (C7) 2/4 Brachioradialis (C6) 2/4  Brachioradialis (C6) 2/4  Lower Extremity Lower Extremity  quadriceps (L-2 to L-4) 2/4   quadriceps (L-2  to L-4) 2/4 Achilles (S1) 2/4   Achilles (S1) 2/4  Plantars: Right: downgoing   Left: downgoing Cerebellar: normal finger-to-nose,  normal heel-to-shin test Gait: no ataxia. CV: pulses palpable throughout    Lab Results: No results found for this basename: cbc, bmp, coags, chol, tri, ldl, hga1c   Lipid Panel No results found for this basename: CHOL, TRIG, HDL, CHOLHDL, VLDL, LDLCALC,  in the last 72 hours  Studies/Results: Ct Head Wo Contrast  08/25/2013   CLINICAL DATA:  Unresponsive, found hanging  EXAM: CT HEAD WITHOUT CONTRAST  CT CERVICAL SPINE WITHOUT CONTRAST  TECHNIQUE: Multidetector CT imaging of the head and cervical spine was performed following the standard protocol without intravenous contrast. Multiplanar CT image reconstructions of the cervical spine were also generated.  COMPARISON:  None  FINDINGS: CT HEAD FINDINGS  Normal ventricular morphology. No midline shift or mass effect. Normal appearance of brain parenchyma. No intracranial hemorrhage, mass lesion, or acute infarction. Visualized paranasal sinuses and mastoid air cells clear. Bones unremarkable.  CT CERVICAL SPINE FINDINGS  Endotracheal tube within the trachea.  Prevertebral soft tissues normal thickness.  Visualized skull base intact.  Orogastric tube traverses cervical esophagus.  Disc space narrowing with endplate spur formation at C4-C5, C5-C6, and C6-C7, minimally at C3-C4.  Vertebral body heights maintained without fracture or subluxation.  C1-C2 alignment normal.  Biapical lung scarring greater on left. No definite soft tissue abnormalities.  IMPRESSION: CT HEAD IMPRESSION  No acute intracranial abnormalities.  CT CERVICAL SPINE IMPRESSION  No acute cervical spine abnormalities.  Multilevel degenerative disc disease  changes cervical spine.   Electronically Signed   By: Ulyses Southward M.D.   On: 08/25/2013 12:26   Ct Cervical Spine Wo Contrast  08/25/2013   CLINICAL DATA:  Unresponsive, found hanging  EXAM: CT HEAD WITHOUT CONTRAST  CT CERVICAL SPINE WITHOUT CONTRAST  TECHNIQUE: Multidetector CT imaging of the head and cervical spine was performed following the standard protocol without intravenous contrast. Multiplanar CT image reconstructions of the cervical spine were also generated.  COMPARISON:  None  FINDINGS: CT HEAD FINDINGS  Normal ventricular morphology. No midline shift or mass effect. Normal appearance of brain parenchyma. No intracranial hemorrhage, mass lesion, or acute infarction. Visualized paranasal sinuses and mastoid air cells clear. Bones unremarkable.  CT CERVICAL SPINE FINDINGS  Endotracheal tube within the trachea.  Prevertebral soft tissues normal thickness.  Visualized skull base intact.  Orogastric tube traverses cervical esophagus.  Disc space narrowing with endplate spur formation at C4-C5, C5-C6, and C6-C7, minimally at C3-C4.  Vertebral body heights maintained without fracture or subluxation.  C1-C2 alignment normal.  Biapical lung scarring greater on left. No definite soft tissue abnormalities.  IMPRESSION: CT HEAD IMPRESSION  No acute intracranial abnormalities.  CT CERVICAL SPINE IMPRESSION  No acute cervical spine abnormalities.  Multilevel degenerative disc disease changes cervical spine.   Electronically Signed   By: Ulyses Southward M.D.   On: 08/25/2013 12:26   Dg Chest Port 1 View  08/26/2013   *RADIOLOGY REPORT*  Clinical Data: Evaluate endotracheal tube position  PORTABLE CHEST - 1 VIEW  Comparison: Portable chest x-ray of 08/25/2013  Findings: The tip of the endotracheal tube is approximately 4.0 cm above the carina.  The lungs appear well aerated.  No infiltrate or effusion is seen.  A calcified granuloma again is noted in the right lung base.  Heart size is stable.  IMPRESSION:  Endotracheal tube tip 4.0 cm above the carina.  No active lung disease.  Original Report Authenticated By: Dwyane Dee, M.D.   Dg Chest Portable 1 View  08/25/2013   *RADIOLOGY REPORT*  Clinical Data: Intubated  PORTABLE CHEST - 1 VIEW  Comparison: Portable chest x-ray of 08/25/2013  Findings: The tip of the endotracheal tube is approximately 2.9 cm above the carina. The lungs appear clear.  An NG tube is present. The heart is within normal limits in size.  IMPRESSION:  1.  Endotracheal tube tip 2.9 cm above the carina. 2.  No active lung disease.   Original Report Authenticated By: Dwyane Dee, M.D.   Dg Chest Portable 1 View  08/25/2013   *RADIOLOGY REPORT*  Clinical Data: Altered mental status, uncontrollable shaking  PORTABLE CHEST - 1 VIEW  Comparison: None.  Findings: No active infiltrate or effusion is seen.  A probable calcified granuloma is noted at the right lung base.  Mediastinal contours appear normal.  The heart is within normal limits in size. No bony abnormality is seen.  IMPRESSION: No active lung disease.   Original Report Authenticated By: Dwyane Dee, M.D.    MEDICATIONS                                                                                                                       I have reviewed the patient's current medications.  ASSESSMENT/PLAN:                                                                                                            Transient neurological dysfunction in the context of anoxic brain injury. No active neurological issues at this moment. Will cancel previously requested brain MRI.  Wyatt Portela, MD [facility] [phone]  08/27/2013, 10:16 AM

## 2013-08-27 NOTE — Evaluation (Signed)
Physical Therapy Evaluation Patient Details Name: Anthony Skinner MRN: [id number] DOB: Jul 11, 1941 Today's Date: 08/27/2013 Time: 8657-8469 PT Time Calculation (min): 33 min  PT Assessment / Plan / Recommendation History of Present Illness  72 year old male with recent diagnosis of Alzheimer disease and subsequent depression, found by wife in shed on morning of admission hanging by a rope.  EMS was called and patient had a pulse but was not protecting his airway.  Patient was brought to the ED where he was intubated and PCCM was called to admit. MD reports anoxic brain injury.   Clinical Impression  Pt admitted with the above and sustained anoxic BI. Pt currently with functional limitations due to the deficits listed below (see PT Problem List).  Pt moving well and do not anticipate any post acute PT needs.   Plan to attempt to ambulate without RW next session.     PT Assessment  Patient needs continued PT services    Follow Up Recommendations  No PT follow up;Supervision/Assistance - 24 hour    Equipment Recommendations  None recommended by PT    Frequency Min 3X/week    Precautions / Restrictions Precautions Precautions: Fall   Pertinent Vitals/Pain No c/o pain      Mobility  Bed Mobility Bed Mobility: Supine to Sit Supine to Sit: 5: Supervision Details for Bed Mobility Assistance: Supervision for safety with lines and impulsive Transfers Transfers: Sit to Stand;Stand to Sit Sit to Stand: 4: Min guard;From bed;From chair/3-in-1 Stand to Sit: 4: Min guard;To chair/3-in-1 Details for Transfer Assistance: Minguard for safety due to impuslive and cues for hand placement Ambulation/Gait Ambulation/Gait Assistance: 4: Min guard Ambulation Distance (Feet): 200 Feet Assistive device: Rolling walker Ambulation/Gait Assistance Details: Minguard for safety.  Pt moving well and plan to attempt to ambulate without RW next session. Gait Pattern: Step-through pattern Gait velocity:  wfl Stairs: No    Exercises     PT Diagnosis: Difficulty walking  PT Problem List: Decreased balance;Decreased mobility;Decreased safety awareness;Decreased cognition;Decreased activity tolerance;Decreased knowledge of use of DME PT Treatment Interventions: DME instruction;Gait training;Stair training;Functional mobility training;Therapeutic activities;Therapeutic exercise;Balance training;Cognitive remediation;Patient/family education     PT Goals(Current goals can be found in the care plan section) Acute Rehab PT Goals Patient Stated Goal: To go home PT Goal Formulation: With patient Time For Goal Achievement: 09/03/13 Potential to Achieve Goals: Good  Visit Information  Last PT Received On: 08/27/13 Assistance Needed: +1 History of Present Illness: 72 year old male with recent diagnosis of Alzheimer disease and subsequent depression, found by wife in shed on morning of admission hanging by a rope.  EMS was called and patient had a pulse but was not protecting his airway.  Patient was brought to the ED where he was intubated and PCCM was called to admit. MD reports anoxic brain injury.        Prior Functioning  Home Living Family/patient expects to be discharged to:: Private residence Living Arrangements: Spouse/significant other Available Help at Discharge: Family;Available 24 hours/day Type of Home: House Home Access: Stairs to enter [employer] of Steps: 3 Entrance Stairs-Rails: None Home Layout: One level Home Equipment: Cane - single point;Walker - 2 wheels Prior Function Level of Independence: Independent Communication Communication: No difficulties Dominant Hand: Right    Cognition  Cognition Arousal/Alertness: Awake/alert Behavior During Therapy: Impulsive Overall Cognitive Status: No family/caregiver present to determine baseline cognitive functioning (Hx of dementia) Area of Impairment: Safety/judgement Safety/Judgement: Decreased awareness of  safety    Extremity/Trunk Assessment Lower  Extremity Assessment Lower Extremity Assessment: Overall WFL for tasks assessed   Balance    End of Session PT - End of Session Equipment Utilized During Treatment: Gait belt Activity Tolerance: Patient tolerated treatment well Patient left: in chair;with call bell/phone within reach;with nursing/sitter in room Nurse Communication: Mobility status  GP     Anthony Skinner 08/27/2013, 9:02 AM  Jake Shark, PT DPT [phone]
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
## 2013-08-28 DIAGNOSIS — T71164A Asphyxiation due to hanging, undetermined, initial encounter: Secondary | ICD-10-CM

## 2013-08-28 DIAGNOSIS — G931 Anoxic brain damage, not elsewhere classified: Principal | ICD-10-CM

## 2013-08-28 DIAGNOSIS — E876 Hypokalemia: Secondary | ICD-10-CM

## 2013-08-28 LAB — CBC
HCT: 38.9 % — ABNORMAL LOW (ref 39.0–52.0)
Hemoglobin: 13 g/dL (ref 13.0–17.0)
MCH: 28.1 pg (ref 26.0–34.0)
MCHC: 33.4 g/dL (ref 30.0–36.0)
MCV: 84 fL (ref 78.0–100.0)
Platelets: 179 10*3/uL (ref 150–400)
RBC: 4.63 MIL/uL (ref 4.22–5.81)
RDW: 15 % (ref 11.5–15.5)
WBC: 6.9 10*3/uL (ref 4.0–10.5)

## 2013-08-28 LAB — MAGNESIUM: Magnesium: 2.1 mg/dL (ref 1.5–2.5)

## 2013-08-28 LAB — BASIC METABOLIC PANEL
BUN: 9 mg/dL (ref 6–23)
CO2: 25 mEq/L (ref 19–32)
Calcium: 8.6 mg/dL (ref 8.4–10.5)
Chloride: 106 mEq/L (ref 96–112)
Creatinine, Ser: 0.83 mg/dL (ref 0.50–1.35)
GFR calc Af Amer: >90 mL/min (ref >90)
GFR calc non Af Amer: 87 mL/min — ABNORMAL LOW (ref >90)
Glucose, Bld: 96 mg/dL (ref 70–99)
Potassium: 3.4 mEq/L — ABNORMAL LOW (ref 3.5–5.1)
Sodium: 142 mEq/L (ref 135–145)

## 2013-08-28 LAB — PHOSPHORUS: Phosphorus: 2.8 mg/dL (ref 2.3–4.6)

## 2013-08-28 MED ORDER — POTASSIUM CHLORIDE CRYS ER 20 MEQ PO TBCR
40.0000 meq | EXTENDED_RELEASE_TABLET | Freq: Once | ORAL | Status: AC
  Administered 2013-08-28: 40 meq via ORAL
  Filled 2013-08-28 (×2): qty 2

## 2013-08-28 NOTE — Progress Notes (Signed)
Physical Therapy Treatment Patient Details Name: Anthony Skinner MRN: [id number] DOB: October 01, 1941 Today's Date: 08/28/2013 Time: 4098-1191 PT Time Calculation (min): 10 min  PT Assessment / Plan / Recommendation  History of Present Illness 72 year old male with recent diagnosis of Alzheimer disease and subsequent depression, found by wife in shed on morning of admission hanging by a rope.  EMS was called and patient had a pulse but was not protecting his airway.  Patient was brought to the ED where he was intubated and PCCM was called to admit. MD reports anoxic brain injury.    PT Comments   Pt pleasant and agreeable to mobility today.  Pt ambulated in hallway and was a little unsteady initially however improved with time.  Pt also performed stairs and required min assist for balance upon ascending.  D/C plan per CSW note is geropsychiatry unit [facility]).  Will continue to see in acute care for safe mobility however pt will not require f/u PT upon d/c.   Follow Up Recommendations  No PT follow up;Supervision/Assistance - 24 hour     Does the patient have the potential to tolerate intense rehabilitation     Barriers to Discharge        Equipment Recommendations  None recommended by PT    Recommendations for Other Services    Frequency Min 2X/week   Progress towards PT Goals Progress towards PT goals: Progressing toward goals  Plan Current plan remains appropriate;Frequency needs to be updated    Precautions / Restrictions Precautions Precautions: Fall   Pertinent Vitals/Pain n/a    Mobility  Bed Mobility Bed Mobility: Supine to Sit;Sit to Supine Supine to Sit: 6: Modified independent (Device/Increase time) Sit to Supine: 6: Modified independent (Device/Increase time) Transfers Transfers: Sit to Stand;Stand to Sit Sit to Stand: 5: Supervision;With upper extremity assist;From bed Stand to Sit: 5: Supervision;With upper extremity assist;To bed Details for Transfer Assistance:  supervision as pt a little unsteady upon rise  Ambulation/Gait Ambulation/Gait Assistance: 4: Min guard Ambulation Distance (Feet): 400 Feet Assistive device: None Ambulation/Gait Assistance Details: initially 1 HHA however able to progress to no support, pt also unsteady initially as well however improved with time Gait Pattern: Step-through pattern;Narrow base of support Gait velocity: wfl Stairs: Yes Stairs Assistance: 4: Min assist Stairs Assistance Details (indicate cue type and reason): assist for balance due to catching foot on step upon ascending, verbal cues for safety, otherwise min/guard Stair Management Technique: One rail Left Number of Stairs: 10    Exercises     PT Diagnosis:    PT Problem List:   PT Treatment Interventions:     PT Goals (current goals can now be found in the care plan section)    Visit Information  Last PT Received On: 08/28/13 Assistance Needed: +1 History of Present Illness: 72 year old male with recent diagnosis of Alzheimer disease and subsequent depression, found by wife in shed on morning of admission hanging by a rope.  EMS was called and patient had a pulse but was not protecting his airway.  Patient was brought to the ED where he was intubated and PCCM was called to admit. MD reports anoxic brain injury.     Subjective Data      Cognition  Cognition Arousal/Alertness: Awake/alert Behavior During Therapy: Impulsive Overall Cognitive Status: No family/caregiver present to determine baseline cognitive functioning (Hx of dementia) Area of Impairment: Safety/judgement Safety/Judgement: Decreased awareness of safety    Balance     End of Session PT -  End of Session Activity Tolerance: Patient tolerated treatment well Patient left: in bed;with call bell/phone within reach;with nursing/sitter in room Nurse Communication: Mobility status   GP     Sayge Brienza,KATHrine E 08/28/2013, 1:48 PM Zenovia Jarred, PT, DPT 08/28/2013 Pager:  [phone]

## 2013-08-28 NOTE — Progress Notes (Signed)
I have reviewed chart and see Psychiatry is recommending  geropsychiatry unit ([place]) at d/c. I have contacted [facility]  Admitting unit and await call back to make referral for consideration. Will advise- Reece Levy, MSW Weekend Coverage [phone]

## 2013-08-28 NOTE — Progress Notes (Signed)
[facility] PROGRESS NOTE  Anthony Skinner WGN:[id number] DOB: [birth year] DOA: 08/25/2013 PCP: Robynn Pane, MD  Assessment/Plan: 1. Anoxic brain injury - Patient found by wife on the end of a rope.  Patient is alert, awake, and feeling much better.    2. AMS - resolved and must have been initially due to # 1  3. Suicide attempt - Psychiatry on board and recommended inpatient psychiatric placement once he is medically cleared. - Medically cleared from my standpoint for transition to psychiatric hospital  4. Hypokalemia - Will replace orally and reassess next am.   Code Status: full Family Communication: no family at bedside  Disposition Plan: Transfer to Psychiatric hospital once bed available.   Consultants:  Psychiatry  Procedures:  none  Antibiotics:  None  HPI/Subjective: No acute issues reported overnight. Patient feels better  Objective: Filed Vitals:   08/28/13 1800  BP: 130/62  Pulse: 88  Temp: 98.6 F (37 C)  Resp: 18    Intake/Output Summary (Last 24 hours) at 08/28/13 1957 Last data filed at 08/28/13 1801  Gross per 24 hour  Intake    960 ml  Output      0 ml  Net    960 ml   Filed Weights   08/25/13 1500 08/26/13 0500 08/27/13 0430  Weight: 67.1 kg (147 lb 14.9 oz) 68.1 kg (150 lb 2.1 oz) 67.8 kg (149 lb 7.6 oz)    Exam:   General:  Pt in NAD, Alert and Awake  Cardiovascular: RRR, no MRG  Respiratory: CTA BL, no wheezes  Abdomen: soft, NT, ND  Musculoskeletal: no cyanosis or clubbing   Data Reviewed: Basic Metabolic Panel:  Recent Labs Lab 08/25/13 1113 08/25/13 1543 08/25/13 1544 08/26/13 0450 08/27/13 0430 08/28/13 0515  NA 135 133*  --  136 142 142  K 3.8 4.1  --  3.9 3.5 3.4*  CL 99 100  --  104 108 106  CO2 20 23  --  23 25 25   GLUCOSE 152* 197*  --  114* 94 96  BUN 12 12  --  9 9 9   CREATININE 1.09 1.01  --  0.79 0.84 0.83  CALCIUM 9.3 8.3*  --  8.5 8.4 8.6  MG 2.1  --  1.8 1.9 2.0 2.1  PHOS  --   --   2.8 2.3 1.7* 2.8   Liver Function Tests:  Recent Labs Lab 08/25/13 1113 08/25/13 1543  AST 20 23  ALT 12 11  ALKPHOS 70 60  BILITOT 0.5 0.5  PROT 7.3 6.3  ALBUMIN 4.0 3.5   No results found for this basename: LIPASE, AMYLASE,  in the last 168 hours No results found for this basename: AMMONIA,  in the last 168 hours CBC:  Recent Labs Lab 08/25/13 1113 08/25/13 1543 08/26/13 0450 08/27/13 0430 08/28/13 0515  WBC 7.4 11.9* 8.9 7.2 6.9  NEUTROABS 3.3 10.4*  --   --   --   HGB 14.7 13.7 13.1 13.7 13.0  HCT 43.3 40.2 38.7* 39.1 38.9*  MCV 83.8 83.8 83.9 83.5 84.0  PLT 261 223 188 166 179   Cardiac Enzymes:  Recent Labs Lab 08/25/13 1113  CKTOTAL 98   BNP (last 3 results) No results found for this basename: PROBNP,  in the last 8760 hours CBG:  Recent Labs Lab 08/25/13 1114  GLUCAP 135*    Recent Results (from the past 240 hour(s))  MRSA PCR SCREENING     Status: None   Collection Time  08/25/13  2:53 PM      Result Value Range Status   MRSA by PCR NEGATIVE  NEGATIVE Final   Comment:            The GeneXpert MRSA Assay (FDA     approved for NASAL specimens     only), is one component of a     comprehensive MRSA colonization     surveillance program. It is not     intended to diagnose MRSA     infection nor to guide or     monitor treatment for     MRSA infections.     Studies: No results found.  Scheduled Meds: . pantoprazole  40 mg Oral Daily  . phosphorus  500 mg Oral BID   Continuous Infusions:   Active Problems:   Acute respiratory failure   Altered mental status   Anoxic brain injury   HTN (hypertension)   Suicide attempt    Time spent: > 35 minutes    [name]  [facility] Pager [phone] If 7PM-7AM, please contact night-coverage at [email or web address], password [facility] 08/28/2013, 7:57 PM  LOS: 3 days

## 2013-08-29 LAB — BASIC METABOLIC PANEL
BUN: 11 mg/dL (ref 6–23)
CO2: 28 mEq/L (ref 19–32)
Calcium: 9.2 mg/dL (ref 8.4–10.5)
Chloride: 104 mEq/L (ref 96–112)
Creatinine, Ser: 0.9 mg/dL (ref 0.50–1.35)
GFR calc Af Amer: >90 mL/min (ref >90)
GFR calc non Af Amer: 84 mL/min — ABNORMAL LOW (ref >90)
Glucose, Bld: 110 mg/dL — ABNORMAL HIGH (ref 70–99)
Potassium: 4.3 mEq/L (ref 3.5–5.1)
Sodium: 141 mEq/L (ref 135–145)

## 2013-08-29 MED ORDER — ASPIRIN 325 MG PO TABS
325.0000 mg | ORAL_TABLET | Freq: Once | ORAL | Status: AC
  Administered 2013-08-29: 325 mg via ORAL
  Filled 2013-08-29: qty 1

## 2013-08-29 NOTE — Progress Notes (Signed)
No beds at [facility] today, but possibly tomorrow, per [place] intake.  CSW to f/u in am.

## 2013-08-29 NOTE — Progress Notes (Signed)
Left message for [facility] admitting re: pt's admission status.  Await return call.

## 2013-08-29 NOTE — Progress Notes (Signed)
[facility] PROGRESS NOTE  Anthony Skinner NWG:[id number] DOB: 1941/02/17 DOA: 08/25/2013 PCP: Robynn Pane, MD  Assessment/Plan: 1. Anoxic brain injury - Patient found by wife at home on the end of a rope prior to admission.   - Patient is alert, awake, and feeling much better.    2. AMS - resolved and was initially due to # 1  3. Suicide attempt - Psychiatry on board and recommended inpatient psychiatric placement once he is medically cleared. - Medically cleared from my standpoint for transition to psychiatric hospital  4. Hypokalemia - replaced orally - resolved after oral replacement with last K level at 4.3  Code Status: full Family Communication: no family at bedside  Disposition Plan: Transfer to Psychiatric hospital once bed available.   Consultants:  Psychiatry  Procedures:  none  Antibiotics:  None  HPI/Subjective: No acute issues reported overnight. Patient feels better. No new complaints.  Objective: Filed Vitals:   08/29/13 1344  BP: 116/64  Pulse: 73  Temp: 98.2 F (36.8 C)  Resp: 20    Intake/Output Summary (Last 24 hours) at 08/29/13 1853 Last data filed at 08/29/13 1300  Gross per 24 hour  Intake    600 ml  Output      0 ml  Net    600 ml   Filed Weights   08/25/13 1500 08/26/13 0500 08/27/13 0430  Weight: 67.1 kg (147 lb 14.9 oz) 68.1 kg (150 lb 2.1 oz) 67.8 kg (149 lb 7.6 oz)    Exam:   General:  Pt in NAD, Alert and Awake  Cardiovascular: RRR, no MRG  Respiratory: CTA BL, no wheezes  Abdomen: soft, NT, ND  Musculoskeletal: no cyanosis or clubbing   Data Reviewed: Basic Metabolic Panel:  Recent Labs Lab 08/25/13 1113 08/25/13 1543 08/25/13 1544 08/26/13 0450 08/27/13 0430 08/28/13 0515 08/29/13 0635  NA 135 133*  --  136 142 142 141  K 3.8 4.1  --  3.9 3.5 3.4* 4.3  CL 99 100  --  104 108 106 104  CO2 20 23  --  23 25 25 28   GLUCOSE 152* 197*  --  114* 94 96 110*  BUN 12 12  --  9 9 9 11   CREATININE  1.09 1.01  --  0.79 0.84 0.83 0.90  CALCIUM 9.3 8.3*  --  8.5 8.4 8.6 9.2  MG 2.1  --  1.8 1.9 2.0 2.1  --   PHOS  --   --  2.8 2.3 1.7* 2.8  --    Liver Function Tests:  Recent Labs Lab 08/25/13 1113 08/25/13 1543  AST 20 23  ALT 12 11  ALKPHOS 70 60  BILITOT 0.5 0.5  PROT 7.3 6.3  ALBUMIN 4.0 3.5   No results found for this basename: LIPASE, AMYLASE,  in the last 168 hours No results found for this basename: AMMONIA,  in the last 168 hours CBC:  Recent Labs Lab 08/25/13 1113 08/25/13 1543 08/26/13 0450 08/27/13 0430 08/28/13 0515  WBC 7.4 11.9* 8.9 7.2 6.9  NEUTROABS 3.3 10.4*  --   --   --   HGB 14.7 13.7 13.1 13.7 13.0  HCT 43.3 40.2 38.7* 39.1 38.9*  MCV 83.8 83.8 83.9 83.5 84.0  PLT 261 223 188 166 179   Cardiac Enzymes:  Recent Labs Lab 08/25/13 1113  CKTOTAL 98   BNP (last 3 results) No results found for this basename: PROBNP,  in the last 8760 hours CBG:  Recent Labs Lab 08/25/13  1114  GLUCAP 135*    Recent Results (from the past 240 hour(s))  MRSA PCR SCREENING     Status: None   Collection Time    08/25/13  2:53 PM      Result Value Range Status   MRSA by PCR NEGATIVE  NEGATIVE Final   Comment:            The GeneXpert MRSA Assay (FDA     approved for NASAL specimens     only), is one component of a     comprehensive MRSA colonization     surveillance program. It is not     intended to diagnose MRSA     infection nor to guide or     monitor treatment for     MRSA infections.     Studies: No results found.  Scheduled Meds: . pantoprazole  40 mg Oral Daily   Continuous Infusions:   Active Problems:   Acute respiratory failure   Altered mental status   Anoxic brain injury   HTN (hypertension)   Suicide attempt   Hypokalemia    Time spent: > 35 minutes    [name]  [facility] Pager [phone] If 7PM-7AM, please contact night-coverage at [email or web address], password [facility] 08/29/2013, 6:53 PM  LOS: 4 days

## 2013-08-30 ENCOUNTER — Inpatient Hospital Stay (HOSPITAL_COMMUNITY): Payer: Medicare Other

## 2013-08-30 DIAGNOSIS — X020XXA Exposure to flames in controlled fire in building or structure, initial encounter: Secondary | ICD-10-CM

## 2013-08-30 LAB — TROPONIN I: Troponin I: <0.3 ng/mL (ref <0.30)

## 2013-08-30 MED ORDER — OXYCODONE HCL 5 MG PO TABS
5.0000 mg | ORAL_TABLET | Freq: Four times a day (QID) | ORAL | Status: DC | PRN
  Administered 2013-08-30: 5 mg via ORAL
  Filled 2013-08-30: qty 1

## 2013-08-30 NOTE — Clinical Social Work Psych Note (Addendum)
1:08pm- Psych CSW contacted [facility].  Pt is under review.  [name] requests the assistance of the unit RN in re: pt current condition/ medical situation.  Psych CSW contacted unit.  Unit RN to contact [name] at [phone].  Pt under review at [facility].  As soon as a bed becomes available, psych CSW will update unit.  Vickii Penna, LCSWA [phone]  Clinical Social Work

## 2013-08-30 NOTE — Discharge Summary (Signed)
Physician Discharge Summary  Anthony Skinner ZOX:[id number] DOB: Aug 02, 1941 DOA: 08/25/2013  PCP: Robynn Pane, MD  Admit date: 08/25/2013 Discharge date: 08/30/2013  Time spent: > 35 minutes  Recommendations for Outpatient Follow-up:  1. Please f/u with K levels  Discharge Diagnoses:  Active Problems:   Acute respiratory failure   Altered mental status   Anoxic brain injury   HTN (hypertension)   Suicide attempt   Hypokalemia   Discharge Condition: stable  Diet recommendation: Regular diet  Filed Weights   08/25/13 1500 08/26/13 0500 08/27/13 0430  Weight: 67.1 kg (147 lb 14.9 oz) 68.1 kg (150 lb 2.1 oz) 67.8 kg (149 lb 7.6 oz)    History of present illness:  72 y/o with alzheimer's dementia and depression who was found hanging on a rope after a Suicide attempt.    Hospital Course:   1. Anoxic brain injury - Patient found by wife at home on the end of a rope prior to admission.  - Patient is alert, awake, and feeling much better.  - Patient was initially intubated and was able to be extubated with improvement in mentation.  2. AMS  - resolved and was initially due to # 1.  3. Suicide attempt  - Psychiatry on board and recommended inpatient psychiatric placement once he is medically cleared.  - Medically cleared from my standpoint for transition to psychiatric hospital   4. Hypokalemia  - replaced orally  - resolved after oral replacement with last K level at 4.3   Code Status: full  Family Communication: no family at bedside  Disposition Plan: Transfer to Psychiatric hospital once bed available.    Procedures: ETT 9/10>>>9/11  PIV  Foley 9/10>>>   Consultations:  PCCM initially  Psychiatry: Cleotis Nipper,    Discharge Exam: Filed Vitals:   08/30/13 0820  BP: 130/71  Pulse: 75  Temp: 98.4 F (36.9 C)  Resp: 18    General: Pt in NAd, Alert and Awake Cardiovascular: RRR, no MRG Respiratory: CTA BL, no wheezes  Discharge  Instructions  Discharge Orders   Future Orders Complete By Expires   Call MD for:  extreme fatigue  As directed    Call MD for:  persistant dizziness or light-headedness  As directed    Call MD for:  temperature >100.4  As directed    Diet - low sodium heart healthy  As directed    Increase activity slowly  As directed        Medication List    STOP taking these medications       aspirin 325 MG tablet     LORazepam 0.5 MG tablet  Commonly known as:  ATIVAN      TAKE these medications       rosuvastatin 10 MG tablet  Commonly known as:  CRESTOR  Take 10 mg by mouth daily.       No Known Allergies    The results of significant diagnostics from this hospitalization (including imaging, microbiology, ancillary and laboratory) are listed below for reference.    Significant Diagnostic Studies: Ct Head Wo Contrast  08/25/2013   CLINICAL DATA:  Unresponsive, found hanging  EXAM: CT HEAD WITHOUT CONTRAST  CT CERVICAL SPINE WITHOUT CONTRAST  TECHNIQUE: Multidetector CT imaging of the head and cervical spine was performed following the standard protocol without intravenous contrast. Multiplanar CT image reconstructions of the cervical spine were also generated.  COMPARISON:  None  FINDINGS: CT HEAD FINDINGS  Normal ventricular morphology. No midline shift or  mass effect. Normal appearance of brain parenchyma. No intracranial hemorrhage, mass lesion, or acute infarction. Visualized paranasal sinuses and mastoid air cells clear. Bones unremarkable.  CT CERVICAL SPINE FINDINGS  Endotracheal tube within the trachea.  Prevertebral soft tissues normal thickness.  Visualized skull base intact.  Orogastric tube traverses cervical esophagus.  Disc space narrowing with endplate spur formation at C4-C5, C5-C6, and C6-C7, minimally at C3-C4.  Vertebral body heights maintained without fracture or subluxation.  C1-C2 alignment normal.  Biapical lung scarring greater on left. No definite soft tissue  abnormalities.  IMPRESSION: CT HEAD IMPRESSION  No acute intracranial abnormalities.  CT CERVICAL SPINE IMPRESSION  No acute cervical spine abnormalities.  Multilevel degenerative disc disease changes cervical spine.   Electronically Signed   By: Ulyses Southward M.D.   On: 08/25/2013 12:26   Ct Cervical Spine Wo Contrast  08/25/2013   CLINICAL DATA:  Unresponsive, found hanging  EXAM: CT HEAD WITHOUT CONTRAST  CT CERVICAL SPINE WITHOUT CONTRAST  TECHNIQUE: Multidetector CT imaging of the head and cervical spine was performed following the standard protocol without intravenous contrast. Multiplanar CT image reconstructions of the cervical spine were also generated.  COMPARISON:  None  FINDINGS: CT HEAD FINDINGS  Normal ventricular morphology. No midline shift or mass effect. Normal appearance of brain parenchyma. No intracranial hemorrhage, mass lesion, or acute infarction. Visualized paranasal sinuses and mastoid air cells clear. Bones unremarkable.  CT CERVICAL SPINE FINDINGS  Endotracheal tube within the trachea.  Prevertebral soft tissues normal thickness.  Visualized skull base intact.  Orogastric tube traverses cervical esophagus.  Disc space narrowing with endplate spur formation at C4-C5, C5-C6, and C6-C7, minimally at C3-C4.  Vertebral body heights maintained without fracture or subluxation.  C1-C2 alignment normal.  Biapical lung scarring greater on left. No definite soft tissue abnormalities.  IMPRESSION: CT HEAD IMPRESSION  No acute intracranial abnormalities.  CT CERVICAL SPINE IMPRESSION  No acute cervical spine abnormalities.  Multilevel degenerative disc disease changes cervical spine.   Electronically Signed   By: Ulyses Southward M.D.   On: 08/25/2013 12:26   Dg Chest Port 1 View  08/26/2013   *RADIOLOGY REPORT*  Clinical Data: Evaluate endotracheal tube position  PORTABLE CHEST - 1 VIEW  Comparison: Portable chest x-ray of 08/25/2013  Findings: The tip of the endotracheal tube is approximately 4.0 cm  above the carina.  The lungs appear well aerated.  No infiltrate or effusion is seen.  A calcified granuloma again is noted in the right lung base.  Heart size is stable.  IMPRESSION: Endotracheal tube tip 4.0 cm above the carina.  No active lung disease.   Original Report Authenticated By: Dwyane Dee, M.D.   Dg Chest Portable 1 View  08/25/2013   *RADIOLOGY REPORT*  Clinical Data: Intubated  PORTABLE CHEST - 1 VIEW  Comparison: Portable chest x-ray of 08/25/2013  Findings: The tip of the endotracheal tube is approximately 2.9 cm above the carina. The lungs appear clear.  An NG tube is present. The heart is within normal limits in size.  IMPRESSION:  1.  Endotracheal tube tip 2.9 cm above the carina. 2.  No active lung disease.   Original Report Authenticated By: Dwyane Dee, M.D.   Dg Chest Portable 1 View  08/25/2013   *RADIOLOGY REPORT*  Clinical Data: Altered mental status, uncontrollable shaking  PORTABLE CHEST - 1 VIEW  Comparison: None.  Findings: No active infiltrate or effusion is seen.  A probable calcified granuloma is noted at the right lung  base.  Mediastinal contours appear normal.  The heart is within normal limits in size. No bony abnormality is seen.  IMPRESSION: No active lung disease.   Original Report Authenticated By: Dwyane Dee, M.D.    Microbiology: Recent Results (from the past 240 hour(s))  MRSA PCR SCREENING     Status: None   Collection Time    08/25/13  2:53 PM      Result Value Range Status   MRSA by PCR NEGATIVE  NEGATIVE Final   Comment:            The GeneXpert MRSA Assay (FDA     approved for NASAL specimens     only), is one component of a     comprehensive MRSA colonization     surveillance program. It is not     intended to diagnose MRSA     infection nor to guide or     monitor treatment for     MRSA infections.     Labs: Basic Metabolic Panel:  Recent Labs Lab 08/25/13 1113 08/25/13 1543 08/25/13 1544 08/26/13 0450 08/27/13 0430 08/28/13 0515  08/29/13 0635  NA 135 133*  --  136 142 142 141  K 3.8 4.1  --  3.9 3.5 3.4* 4.3  CL 99 100  --  104 108 106 104  CO2 20 23  --  23 25 25 28   GLUCOSE 152* 197*  --  114* 94 96 110*  BUN 12 12  --  9 9 9 11   CREATININE 1.09 1.01  --  0.79 0.84 0.83 0.90  CALCIUM 9.3 8.3*  --  8.5 8.4 8.6 9.2  MG 2.1  --  1.8 1.9 2.0 2.1  --   PHOS  --   --  2.8 2.3 1.7* 2.8  --    Liver Function Tests:  Recent Labs Lab 08/25/13 1113 08/25/13 1543  AST 20 23  ALT 12 11  ALKPHOS 70 60  BILITOT 0.5 0.5  PROT 7.3 6.3  ALBUMIN 4.0 3.5   No results found for this basename: LIPASE, AMYLASE,  in the last 168 hours No results found for this basename: AMMONIA,  in the last 168 hours CBC:  Recent Labs Lab 08/25/13 1113 08/25/13 1543 08/26/13 0450 08/27/13 0430 08/28/13 0515  WBC 7.4 11.9* 8.9 7.2 6.9  NEUTROABS 3.3 10.4*  --   --   --   HGB 14.7 13.7 13.1 13.7 13.0  HCT 43.3 40.2 38.7* 39.1 38.9*  MCV 83.8 83.8 83.9 83.5 84.0  PLT 261 223 188 166 179   Cardiac Enzymes:  Recent Labs Lab 08/25/13 1113  CKTOTAL 98   BNP: BNP (last 3 results) No results found for this basename: PROBNP,  in the last 8760 hours CBG:  Recent Labs Lab 08/25/13 1114  GLUCAP 135*       Signed:  [name]  [facility] 08/30/2013, 2:43 PM

## 2013-08-30 NOTE — Progress Notes (Signed)
Pt complained of chest pain on his left side. 8 out of 10. Pt denies sob or dizziness. VS stable. ECG normal. Pt states it hurts but he is unable to describe it. MD was called and notified. Pt was given an Oxy IR for pain, chest x ray was done and labs were ordered. Will f/u.

## 2013-08-31 LAB — BASIC METABOLIC PANEL
BUN: 21 mg/dL (ref 6–23)
CO2: 29 mEq/L (ref 19–32)
Calcium: 9.4 mg/dL (ref 8.4–10.5)
Chloride: 101 mEq/L (ref 96–112)
Creatinine, Ser: 1.15 mg/dL (ref 0.50–1.35)
GFR calc Af Amer: 73 mL/min — ABNORMAL LOW (ref >90)
GFR calc non Af Amer: 63 mL/min — ABNORMAL LOW (ref >90)
Glucose, Bld: 123 mg/dL — ABNORMAL HIGH (ref 70–99)
Potassium: 4.2 mEq/L (ref 3.5–5.1)
Sodium: 137 mEq/L (ref 135–145)

## 2013-08-31 LAB — CBC
HCT: 40.7 % (ref 39.0–52.0)
Hemoglobin: 14.2 g/dL (ref 13.0–17.0)
MCH: 28.8 pg (ref 26.0–34.0)
MCHC: 34.9 g/dL (ref 30.0–36.0)
MCV: 82.6 fL (ref 78.0–100.0)
Platelets: 274 10*3/uL (ref 150–400)
RBC: 4.93 MIL/uL (ref 4.22–5.81)
RDW: 14.5 % (ref 11.5–15.5)
WBC: 5.7 10*3/uL (ref 4.0–10.5)

## 2013-08-31 NOTE — Progress Notes (Signed)
Physical Therapy Treatment Patient Details Name: Anthony Skinner MRN: [id number] DOB: Feb 22, 1941 Today's Date: 08/31/2013 Time: 5621-3086 PT Time Calculation (min): 8 min  PT Assessment / Plan / Recommendation  History of Present Illness 72 year old male with recent diagnosis of Alzheimer disease and subsequent depression, found by wife in shed on morning of admission hanging by a rope.  EMS was called and patient had a pulse but was not protecting his airway.  Patient was brought to the ED where he was intubated and PCCM was called to admit. MD reports anoxic brain injury.    PT Comments   Pt independent with mobility and no further PT needed.  Follow Up Recommendations  No PT follow up     Does the patient have the potential to tolerate intense rehabilitation     Barriers to Discharge        Equipment Recommendations  None recommended by PT    Recommendations for Other Services    Frequency     Progress towards PT Goals Progress towards PT goals: Goals met/education completed, patient discharged from PT  Plan Other (comment) (Pt dc'd from PT.)    Precautions / Restrictions Precautions Precautions: Other (comment) Precaution Comments: suicide   Pertinent Vitals/Pain N/A    Mobility  Bed Mobility Supine to Sit: 7: Independent Sit to Supine: 7: Independent Transfers Sit to Stand: 7: Independent;From bed Stand to Sit: 7: Independent;To bed Ambulation/Gait Ambulation/Gait Assistance: 7: Independent Ambulation Distance (Feet): 600 Feet Assistive device: None Gait Pattern: Within Functional Limits    Exercises     PT Diagnosis:    PT Problem List:   PT Treatment Interventions:     PT Goals (current goals can now be found in the care plan section)    Visit Information  Last PT Received On: 08/31/13 Assistance Needed: +1 History of Present Illness: 72 year old male with recent diagnosis of Alzheimer disease and subsequent depression, found by wife in shed on morning  of admission hanging by a rope.  EMS was called and patient had a pulse but was not protecting his airway.  Patient was brought to the ED where he was intubated and PCCM was called to admit. MD reports anoxic brain injury.     Subjective Data      Cognition  Cognition Arousal/Alertness: Awake/alert Behavior During Therapy: WFL for tasks assessed/performed Overall Cognitive Status: Within Functional Limits for tasks assessed    Balance  High Level Balance High Level Balance Activites: Side stepping;Backward walking;Direction changes;Turns;Sudden stops;Head turns;Other (comment) (single leg stance, tandem stance) High Level Balance Comments: Independent with all of the above.  End of Session PT - End of Session Activity Tolerance: Patient tolerated treatment well Patient left: in bed;with call bell/phone within reach;with nursing/sitter in room Nurse Communication: Mobility status   GP     [facility] 08/31/2013, 11:57 AM  Skip Mayer PT [phone]

## 2013-08-31 NOTE — Clinical Social Work Psych Note (Addendum)
2:14pm- Psych CSW contacted [facility] and spoke with [name], Dietitian.  [name] reports that pt is under review.  Psych CSW will continue to follow.  10:15am- Psych CSW was contacted by unit RN confirming the medical/chart information was given to [facility].  RN stated that [facility] needed d/c summary.  Psych CSW faxed this d/c summary to [place] at [place].  Confirmation received.  Psych CSW contacted [facility] to f/u on referral.  [name], reviewing RN states that the unit RN (from yesterday) had not yet called to review his medical chart.  Psych CSW called and spoke with unit RN this morning and gave unit RN Kathleen's number [phone].  Unit RN agreed to provide [facility] with the necessary information to have this pt reviewed.  Psych CSW appreciates RN assistance.  Psych CSW will continue to follow.  Vickii Penna, LCSWA [phone]  Clinical Social Work

## 2013-08-31 NOTE — Progress Notes (Signed)
Spoke with [facility]. Information given for d/c. Anthony Skinner requests d/c summary. Spoke with Gina-SW. Fax number given. Gina to fax. Family in room. Will inform.

## 2013-08-31 NOTE — Progress Notes (Signed)
[facility] PROGRESS NOTE  Anthony Skinner OZH:[id number] DOB: 1941-01-10 DOA: 08/25/2013 PCP: Anthony Pane, MD  Assessment/Plan: 1. Anoxic brain injury - Patient found by wife at home on the end of a rope prior to admission.   - Patient is alert, awake, and feeling much better.    2. AMS - resolved and was initially due to # 1  3. Suicide attempt - Psychiatry on board and recommended inpatient psychiatric placement once he is medically cleared. - Medically cleared from my standpoint for transition to psychiatric hospital  4. Hypokalemia - replaced orally - resolved after oral replacement with last K level at 4.3  Code Status: full Family Communication: no family at bedside  Disposition Plan: Transfer to Psychiatric hospital once bed available.   Consultants:  Psychiatry  Procedures:  none  Antibiotics:  None  HPI/Subjective: No acute issues reported overnight. Patient feels better. No Anthony complaints.  Objective: Filed Vitals:   08/31/13 1000  BP: 114/77  Pulse: 81  Temp: 98 F (36.7 C)  Resp: 18    Intake/Output Summary (Last 24 hours) at 08/31/13 1423 Last data filed at 08/31/13 1017  Gross per 24 hour  Intake    600 ml  Output      0 ml  Net    600 ml   Filed Weights   08/25/13 1500 08/26/13 0500 08/27/13 0430  Weight: 67.1 kg (147 lb 14.9 oz) 68.1 kg (150 lb 2.1 oz) 67.8 kg (149 lb 7.6 oz)    Exam:   General:  Pt in NAD, Alert and Awake  Cardiovascular: extremities pink  Respiratory: no increased work of breathing  Abdomen: soft, NT, ND  Musculoskeletal: no cyanosis or clubbing   Data Reviewed: Basic Metabolic Panel:  Recent Labs Lab 08/25/13 1113 08/25/13 1543 08/25/13 1544 08/26/13 0450 08/27/13 0430 08/28/13 0515 08/29/13 0635  NA 135 133*  --  136 142 142 141  K 3.8 4.1  --  3.9 3.5 3.4* 4.3  CL 99 100  --  104 108 106 104  CO2 20 23  --  23 25 25 28   GLUCOSE 152* 197*  --  114* 94 96 110*  BUN 12 12  --  9 9 9  11   CREATININE 1.09 1.01  --  0.79 0.84 0.83 0.90  CALCIUM 9.3 8.3*  --  8.5 8.4 8.6 9.2  MG 2.1  --  1.8 1.9 2.0 2.1  --   PHOS  --   --  2.8 2.3 1.7* 2.8  --    Liver Function Tests:  Recent Labs Lab 08/25/13 1113 08/25/13 1543  AST 20 23  ALT 12 11  ALKPHOS 70 60  BILITOT 0.5 0.5  PROT 7.3 6.3  ALBUMIN 4.0 3.5   No results found for this basename: LIPASE, AMYLASE,  in the last 168 hours No results found for this basename: AMMONIA,  in the last 168 hours CBC:  Recent Labs Lab 08/25/13 1113 08/25/13 1543 08/26/13 0450 08/27/13 0430 08/28/13 0515  WBC 7.4 11.9* 8.9 7.2 6.9  NEUTROABS 3.3 10.4*  --   --   --   HGB 14.7 13.7 13.1 13.7 13.0  HCT 43.3 40.2 38.7* 39.1 38.9*  MCV 83.8 83.8 83.9 83.5 84.0  PLT 261 223 188 166 179   Cardiac Enzymes:  Recent Labs Lab 08/25/13 1113 08/30/13 1840  CKTOTAL 98  --   TROPONINI  --  <0.30   BNP (last 3 results) No results found for this basename: PROBNP,  in the last 8760 hours CBG:  Recent Labs Lab 08/25/13 1114  GLUCAP 135*    Recent Results (from the past 240 hour(s))  MRSA PCR SCREENING     Status: None   Collection Time    08/25/13  2:53 PM      Result Value Range Status   MRSA by PCR NEGATIVE  NEGATIVE Final   Comment:            The GeneXpert MRSA Assay (FDA     approved for NASAL specimens     only), is one component of a     comprehensive MRSA colonization     surveillance program. It is not     intended to diagnose MRSA     infection nor to guide or     monitor treatment for     MRSA infections.     Studies: Dg Chest 2 View  08/30/2013   CLINICAL DATA:  Left-sided chest pain. Some dry cough.  EXAM: CHEST  2 VIEW  COMPARISON:  08/26/2013.  FINDINGS: The support apparatus has been removed since the prior exam.  Stable granuloma are noted in the right mid to lower lung. The lungs are mildly hyperexpanded but otherwise clear.  No pleural effusion or pneumothorax.  Cardiac silhouette is normal in size  and configuration.  Mediastinum is normal in contour and caliber. There are no hilar masses.  The bony thorax is demineralized but intact.  IMPRESSION: No acute cardiopulmonary disease.   Electronically Signed   By: Anthony Skinner   On: 08/30/2013 17:26    Scheduled Meds: . pantoprazole  40 mg Oral Daily   Continuous Infusions:   Active Problems:   Acute respiratory failure   Altered mental status   Anoxic brain injury   HTN (hypertension)   Suicide attempt   Hypokalemia    Time spent: > 35 minutes    Anthony Skinner  [facility] Pager [phone] If 7PM-7AM, please contact night-coverage at [email or web address], password Anthony Skinner 08/31/2013, 2:23 PM  LOS: 6 days

## 2013-08-31 NOTE — Progress Notes (Signed)
Awaiting for [facility] to transport to [facility] gero-psych ward, room 415-B. Anthony Skinner is accepting physician. Process began at 10am this am when I spoke with "[name]" in admissions. D/C summary requested and sent. Called "[name]" again to ensure summary received and to obtain status on bed. "[name]" stated she had received information and was awaiting [name],NP to review. After several hours and no phone call, I again called "[name]" whom now asked for current VS, labs and MD last progress note be sent. Information faxed. Called again to check on bed status and was told that more current labs were needed. Dr.Vega notified,labs ordered,drawn and results faxed to facility. Called facility again, spoke with "[name]" (whom stated it was procedure to ask for labs within 24 hrs.) who requested signed and witnessed voluntary commitment paper. Obtained and faxed to facility. Received confirmation on bed @ approx. 8:15pm. Wife notified of transport. Patient aware of transport.

## 2013-09-09 ENCOUNTER — Telehealth: Payer: Self-pay | Admitting: Internal Medicine

## 2013-09-09 NOTE — Telephone Encounter (Signed)
Returned call to patient's wife. She states the patient tried to commit suicide 2 weeks ago. She wanted to call and thank Dr. Juanda Chance for listening to them and for all she has done for them. Patient is in a clinic now getting help. They think he has dementia.

## 2013-09-15 ENCOUNTER — Encounter (HOSPITAL_COMMUNITY): Payer: Self-pay | Admitting: *Deleted

## 2013-09-15 ENCOUNTER — Encounter (HOSPITAL_COMMUNITY)
Admission: EM | Disposition: A | Discharge status: Home / Self Care | Payer: Medicare Other | Source: Home / Self Care | Attending: Cardiology

## 2013-09-15 ENCOUNTER — Inpatient Hospital Stay (HOSPITAL_COMMUNITY): Payer: Medicare Other

## 2013-09-15 ENCOUNTER — Inpatient Hospital Stay (HOSPITAL_COMMUNITY)
Admission: EM | Admit: 2013-09-15 | Discharge: 2013-09-16 | DRG: 287 | Disposition: A | Discharge status: Home / Self Care | Payer: Medicare Other | Attending: Cardiology | Admitting: Cardiology

## 2013-09-15 DIAGNOSIS — R079 Chest pain, unspecified: Secondary | ICD-10-CM

## 2013-09-15 DIAGNOSIS — R0789 Other chest pain: Principal | ICD-10-CM | POA: Diagnosis present

## 2013-09-15 DIAGNOSIS — I10 Essential (primary) hypertension: Secondary | ICD-10-CM | POA: Diagnosis present

## 2013-09-15 DIAGNOSIS — F0390 Unspecified dementia without behavioral disturbance: Secondary | ICD-10-CM | POA: Diagnosis present

## 2013-09-15 DIAGNOSIS — I2510 Atherosclerotic heart disease of native coronary artery without angina pectoris: Secondary | ICD-10-CM

## 2013-09-15 DIAGNOSIS — F3289 Other specified depressive episodes: Secondary | ICD-10-CM | POA: Diagnosis present

## 2013-09-15 DIAGNOSIS — Z79899 Other long term (current) drug therapy: Secondary | ICD-10-CM

## 2013-09-15 DIAGNOSIS — M1990 Unspecified osteoarthritis, unspecified site: Secondary | ICD-10-CM | POA: Diagnosis present

## 2013-09-15 DIAGNOSIS — Z7982 Long term (current) use of aspirin: Secondary | ICD-10-CM

## 2013-09-15 DIAGNOSIS — I252 Old myocardial infarction: Secondary | ICD-10-CM

## 2013-09-15 DIAGNOSIS — Z87891 Personal history of nicotine dependence: Secondary | ICD-10-CM

## 2013-09-15 DIAGNOSIS — F17200 Nicotine dependence, unspecified, uncomplicated: Secondary | ICD-10-CM | POA: Diagnosis present

## 2013-09-15 DIAGNOSIS — F329 Major depressive disorder, single episode, unspecified: Secondary | ICD-10-CM | POA: Diagnosis present

## 2013-09-15 DIAGNOSIS — I213 ST elevation (STEMI) myocardial infarction of unspecified site: Secondary | ICD-10-CM

## 2013-09-15 DIAGNOSIS — E785 Hyperlipidemia, unspecified: Secondary | ICD-10-CM | POA: Diagnosis present

## 2013-09-15 DIAGNOSIS — Z8546 Personal history of malignant neoplasm of prostate: Secondary | ICD-10-CM

## 2013-09-15 HISTORY — DX: Shortness of breath: R06.02

## 2013-09-15 HISTORY — DX: Unspecified dementia, unspecified severity, without behavioral disturbance, psychotic disturbance, mood disturbance, and anxiety: F03.90

## 2013-09-15 HISTORY — PX: CARDIAC CATHETERIZATION: SHX172

## 2013-09-15 HISTORY — DX: Acute myocardial infarction, unspecified: I21.9

## 2013-09-15 HISTORY — DX: Atherosclerotic heart disease of native coronary artery without angina pectoris: I25.10

## 2013-09-15 HISTORY — DX: Syncope and collapse: R55

## 2013-09-15 HISTORY — DX: Suicide attempt, initial encounter: T14.91XA

## 2013-09-15 HISTORY — PX: LEFT HEART CATHETERIZATION WITH CORONARY ANGIOGRAM: SHX5451

## 2013-09-15 LAB — COMPREHENSIVE METABOLIC PANEL
ALT: 16 U/L (ref 0–53)
AST: 23 U/L (ref 0–37)
Albumin: 3.1 g/dL — ABNORMAL LOW (ref 3.5–5.2)
Alkaline Phosphatase: 65 U/L (ref 39–117)
BUN: 12 mg/dL (ref 6–23)
CO2: 24 mEq/L (ref 19–32)
Calcium: 8.8 mg/dL (ref 8.4–10.5)
Chloride: 103 mEq/L (ref 96–112)
Creatinine, Ser: 0.91 mg/dL (ref 0.50–1.35)
GFR calc Af Amer: >90 mL/min (ref >90)
GFR calc non Af Amer: 84 mL/min — ABNORMAL LOW (ref >90)
Glucose, Bld: 103 mg/dL — ABNORMAL HIGH (ref 70–99)
Potassium: 4.4 mEq/L (ref 3.5–5.1)
Sodium: 137 mEq/L (ref 135–145)
Total Bilirubin: 0.2 mg/dL — ABNORMAL LOW (ref 0.3–1.2)
Total Protein: 6.5 g/dL (ref 6.0–8.3)

## 2013-09-15 LAB — PROTIME-INR
INR: 1.11 (ref 0.00–1.49)
Prothrombin Time: 14.1 seconds (ref 11.6–15.2)

## 2013-09-15 LAB — CBC
HCT: 39.9 % (ref 39.0–52.0)
Hemoglobin: 14 g/dL (ref 13.0–17.0)
MCH: 29.4 pg (ref 26.0–34.0)
MCHC: 35.1 g/dL (ref 30.0–36.0)
MCV: 83.6 fL (ref 78.0–100.0)
Platelets: 172 10*3/uL (ref 150–400)
RBC: 4.77 MIL/uL (ref 4.22–5.81)
RDW: 15.2 % (ref 11.5–15.5)
WBC: 4.8 10*3/uL (ref 4.0–10.5)

## 2013-09-15 LAB — POCT I-STAT, CHEM 8
BUN: 13 mg/dL (ref 6–23)
Calcium, Ion: 1.21 mmol/L (ref 1.13–1.30)
Chloride: 105 mEq/L (ref 96–112)
Creatinine, Ser: 1.1 mg/dL (ref 0.50–1.35)
Glucose, Bld: 103 mg/dL — ABNORMAL HIGH (ref 70–99)
HCT: 42 % (ref 39.0–52.0)
Hemoglobin: 14.3 g/dL (ref 13.0–17.0)
Potassium: 4.3 mEq/L (ref 3.5–5.1)
Sodium: 141 mEq/L (ref 135–145)
TCO2: 27 mmol/L (ref 0–100)

## 2013-09-15 LAB — TROPONIN I
Troponin I: <0.3 ng/mL (ref <0.30)
Troponin I: <0.3 ng/mL (ref <0.30)

## 2013-09-15 LAB — APTT: aPTT: 21 seconds — ABNORMAL LOW (ref 24–37)

## 2013-09-15 LAB — POCT I-STAT TROPONIN I: Troponin i, poc: 0.03 ng/mL (ref 0.00–0.08)

## 2013-09-15 SURGERY — LEFT HEART CATHETERIZATION WITH CORONARY ANGIOGRAM
Anesthesia: LOCAL

## 2013-09-15 MED ORDER — NITROGLYCERIN 0.4 MG SL SUBL: 0.4000 mg | SUBLINGUAL_TABLET | SUBLINGUAL | Status: DC | PRN

## 2013-09-15 MED ORDER — LORAZEPAM 0.5 MG PO TABS: 0.5000 mg | ORAL_TABLET | Freq: Every evening | ORAL | Status: DC | PRN

## 2013-09-15 MED ORDER — FENTANYL CITRATE 0.05 MG/ML IJ SOLN
INTRAMUSCULAR | Status: AC
  Filled 2013-09-15: qty 2

## 2013-09-15 MED ORDER — ONDANSETRON HCL 4 MG/2ML IJ SOLN: 4.0000 mg | Freq: Four times a day (QID) | INTRAMUSCULAR | Status: DC | PRN

## 2013-09-15 MED ORDER — VERAPAMIL HCL 2.5 MG/ML IV SOLN
INTRAVENOUS | Status: AC
  Filled 2013-09-15: qty 2

## 2013-09-15 MED ORDER — HEPARIN SODIUM (PORCINE) 5000 UNIT/ML IJ SOLN
5000.0000 [IU] | Freq: Three times a day (TID) | INTRAMUSCULAR | Status: DC
  Administered 2013-09-15 – 2013-09-16 (×4): 5000 [IU] via SUBCUTANEOUS
  Filled 2013-09-15 (×7): qty 1

## 2013-09-15 MED ORDER — DIVALPROEX SODIUM ER 500 MG PO TB24
1500.0000 mg | ORAL_TABLET | Freq: Every evening | ORAL | Status: DC
  Administered 2013-09-15: 18:00:00 1500 mg via ORAL
  Filled 2013-09-15 (×2): qty 3

## 2013-09-15 MED ORDER — MIDAZOLAM HCL 2 MG/2ML IJ SOLN
INTRAMUSCULAR | Status: AC
  Filled 2013-09-15: qty 2

## 2013-09-15 MED ORDER — BIVALIRUDIN 250 MG IV SOLR
INTRAVENOUS | Status: AC
  Filled 2013-09-15: qty 250

## 2013-09-15 MED ORDER — RISPERIDONE 1 MG PO TABS
1.5000 mg | ORAL_TABLET | Freq: Every day | ORAL | Status: DC
  Administered 2013-09-15: 22:00:00 1.5 mg via ORAL
  Filled 2013-09-15 (×2): qty 1

## 2013-09-15 MED ORDER — SODIUM CHLORIDE 0.9 % IJ SOLN
3.0000 mL | Freq: Two times a day (BID) | INTRAMUSCULAR | Status: DC
  Administered 2013-09-15 – 2013-09-16 (×2): 3 mL via INTRAVENOUS

## 2013-09-15 MED ORDER — LIDOCAINE HCL (PF) 1 % IJ SOLN
INTRAMUSCULAR | Status: AC
  Filled 2013-09-15: qty 30

## 2013-09-15 MED ORDER — HEPARIN SODIUM (PORCINE) 5000 UNIT/ML IJ SOLN: 4000.0000 [IU] | Freq: Once | INTRAMUSCULAR | Status: DC

## 2013-09-15 MED ORDER — ATORVASTATIN CALCIUM 40 MG PO TABS
40.0000 mg | ORAL_TABLET | Freq: Every day | ORAL | Status: DC
  Administered 2013-09-15: 40 mg via ORAL
  Filled 2013-09-15 (×2): qty 1

## 2013-09-15 MED ORDER — NITROGLYCERIN 0.2 MG/ML ON CALL CATH LAB
INTRAVENOUS | Status: AC
  Filled 2013-09-15: qty 1

## 2013-09-15 MED ORDER — ACETAMINOPHEN 325 MG PO TABS: 650.0000 mg | ORAL_TABLET | ORAL | Status: DC | PRN

## 2013-09-15 MED ORDER — HEPARIN BOLUS VIA INFUSION: 4000.0000 [IU] | Freq: Once | INTRAVENOUS | Status: DC

## 2013-09-15 MED ORDER — HEPARIN (PORCINE) IN NACL 2-0.9 UNIT/ML-% IJ SOLN
INTRAMUSCULAR | Status: AC
  Filled 2013-09-15: qty 2000

## 2013-09-15 MED ORDER — SODIUM CHLORIDE 0.9 % IV SOLN: 250.0000 mL | INTRAVENOUS | Status: DC | PRN

## 2013-09-15 MED ORDER — SODIUM CHLORIDE 0.9 % IV SOLN: INTRAVENOUS | Status: DC

## 2013-09-15 MED ORDER — SODIUM CHLORIDE 0.9 % IV SOLN: INTRAVENOUS | Status: AC

## 2013-09-15 MED ORDER — HEPARIN SODIUM (PORCINE) 5000 UNIT/ML IJ SOLN
INTRAMUSCULAR | Status: AC
  Administered 2013-09-15: 5000 [IU]
  Filled 2013-09-15: qty 1

## 2013-09-15 MED ORDER — SODIUM CHLORIDE 0.9 % IJ SOLN: 3.0000 mL | INTRAMUSCULAR | Status: DC | PRN

## 2013-09-15 MED ORDER — LORAZEPAM 0.5 MG PO TABS
0.5000 mg | ORAL_TABLET | Freq: Three times a day (TID) | ORAL | Status: DC
  Administered 2013-09-15 – 2013-09-16 (×4): 0.5 mg via ORAL
  Filled 2013-09-15 (×4): qty 1

## 2013-09-15 MED ORDER — HEPARIN SODIUM (PORCINE) 1000 UNIT/ML IJ SOLN
INTRAMUSCULAR | Status: AC
  Filled 2013-09-15: qty 1

## 2013-09-15 MED ORDER — ASPIRIN EC 81 MG PO TBEC
81.0000 mg | DELAYED_RELEASE_TABLET | Freq: Every day | ORAL | Status: DC
Start: 2013-09-16 — End: 2013-09-16
  Administered 2013-09-16: 81 mg via ORAL
  Filled 2013-09-15: qty 1

## 2013-09-15 MED ORDER — DONEPEZIL HCL 5 MG PO TABS
5.0000 mg | ORAL_TABLET | Freq: Every day | ORAL | Status: DC
  Administered 2013-09-15: 5 mg via ORAL
  Filled 2013-09-15 (×2): qty 1

## 2013-09-15 NOTE — Progress Notes (Signed)
Chaplain was referred to pt and family because chaplain had worked with family in the past.  Upon arrival, chaplain talked with both pt and wife and provided emotional support and compassionate listening.  Additionally, chaplain shared in prayer with the family.  Follow up if needed or requested.    09/15/13 1000  Clinical Encounter Type  Visited With Patient and family together  Visit Type Follow-up;Spiritual support  Referral From Chaplain  Consult/Referral To Chaplain  Spiritual Encounters  Spiritual Needs Prayer;Emotional  Stress Factors  Patient Stress Factors None identified  Family Stress Factors None identified  [facility]  Sheral Apley  [phone]

## 2013-09-15 NOTE — Progress Notes (Signed)
Lab called in regarding PT?INR result not valid , blood specimen clotted fro this am. Dr Sharyn Lull made aware , rpt lab not needed .

## 2013-09-15 NOTE — ED Notes (Signed)
Transferred to cath lab 

## 2013-09-15 NOTE — ED Provider Notes (Signed)
CSN: [id number]     Arrival date & time 09/15/13  0701 History   First MD Initiated Contact with Patient 09/15/13 [phone]     Chief Complaint  Patient presents with  . Code STEMI   (Consider location/radiation/quality/duration/timing/severity/associated sxs/prior Treatment) HPI Comments: 72 year old male presents from the EMS with acute lightheadedness. He states he nearly passed out when he was urinating this morning. Is also feeling left shoulder pain. EMS came and her first EKG was concerning for a STEMI. Ligament of the ambulance start a plan of left chest pressure. He was given aspirin and 2 nitroglycerin. His chest pain is gone he still complains a 6/10 left shoulder pain. Vision states she's a patient of Dr. Lady Deutscher. Patient otherwise denies any other complaints, including dyspnea, nausea or diaphoresis  The history is provided by the patient and the EMS personnel.    Past Medical History  Diagnosis Date  . Iron deficiency anemia   . Tubular adenoma of colon 2012  . Gastropathy 2012    reactive  . PUD (peptic ulcer disease)   . Prostate cancer   . Heart attack   . Hyperlipidemia   . Vertebral artery stenosis   . GERD (gastroesophageal reflux disease)   . Hiatal hernia   . Recurrent spontaneous pneumothorax   . DJD (degenerative joint disease)   . Hypertension     pt denies  . Upper GI bleed   . Fatty liver   . Hypertension   . Cancer   . Acute MI    Past Surgical History  Procedure Laterality Date  . Prostatectomy    . Vertebral artery stent    . Lung removal, partial  1960s    left  . Cataract extraction Right   . Cardiac surgery     Family History  Problem Relation Age of Onset  . Heart attack Father   . Alzheimer's disease Mother   . Prostate cancer Brother    History  Substance Use Topics  . Smoking status: Former Smoker    Types: Cigarettes    Quit date: 08/27/2002  . Smokeless tobacco: Not on file  . Alcohol Use: No     Comment: Hx heavy EtOH use  but quit 2011    Review of Systems  Constitutional: Negative for fever and chills.  Respiratory: Negative for cough and shortness of breath.   Cardiovascular: Positive for chest pain.  Gastrointestinal: Negative for nausea, vomiting and abdominal pain.  Musculoskeletal:       Left shoulder pain  Neurological: Positive for light-headedness. Negative for weakness.  All other systems reviewed and are negative.    Allergies  Review of patient's allergies indicates no known allergies.  Home Medications   Current Outpatient Rx  Name  Route  Sig  Dispense  Refill  . aspirin 325 MG tablet   Oral   Take 325 mg by mouth daily.         . cyanocobalamin ([employer] VITAMIN B12) 1000 MCG tablet      Take 1000 mcg po daily   30 tablet   3   . LORazepam (ATIVAN) 0.5 MG tablet      TAKE 1/2 TO 1 TABLET EVERY NIGHT FOR MUSCLE RELAXATION   30 tablet   1   . rosuvastatin (CRESTOR) 10 MG tablet   Oral   Take 10 mg by mouth daily.          BP 128/57  Pulse 90  Temp(Src) 97.8 F (36.6 C)  Resp 24  Ht 6' (1.829 m)  Wt 156 lb (70.761 kg)  BMI 21.15 kg/m2  SpO2 99% Physical Exam  Nursing note and vitals reviewed. Constitutional: He is oriented to person, place, and time. He appears well-developed and well-nourished. No distress.  HENT:  Head: Normocephalic and atraumatic.  Right Ear: External ear normal.  Left Ear: External ear normal.  Nose: Nose normal.  Eyes: Right eye exhibits no discharge. Left eye exhibits no discharge.  Neck: Neck supple.  Cardiovascular: Normal rate, regular rhythm, normal heart sounds and intact distal pulses.   Pulmonary/Chest: Effort normal and breath sounds normal. He has no rales. He exhibits no tenderness.  No pain with ROM of left shoulder  Abdominal: Soft. There is no tenderness.  Musculoskeletal: He exhibits no edema.  Neurological: He is alert and oriented to person, place, and time.  Skin: Skin is warm and dry.    ED Course  Procedures  (including critical care time) Labs Review Labs Reviewed  APTT  CBC  COMPREHENSIVE METABOLIC PANEL  PROTIME-INR    Date: 09/15/2013  Rate: 88  Rhythm: normal sinus rhythm  QRS Axis: normal  Intervals: normal  ST/T Wave abnormalities: ST elevations inferiorly, T wave inversions, possible depressions I, AVL  Conduction Disutrbances:none  Narrative Interpretation: Acute STEMI  Imaging Review No results found.  MDM   1. STEMI (ST elevation myocardial infarction)    Patient received a full dose aspirin and 2 nitroglycerin in route by EMS. Cardiology present on patient arrival. Will give a 4000 unit heparin load. Will go from the ER directly to the Cath Lab. Patient was otherwise hemodynamically stable in the ED.    Audree Camel, MD 09/15/13 [phone]

## 2013-09-15 NOTE — Interval H&P Note (Signed)
History and Physical Interval Note:  09/15/2013 8:09 AM  Anthony Skinner  has presented today for cardiac cath with the diagnosis of stemi  The various methods of treatment have been discussed with the patient and family. After consideration of risks, benefits and other options for treatment, the patient has consented to  Procedure(s): LEFT HEART CATHETERIZATION WITH CORONARY ANGIOGRAM (N/A) as a surgical intervention .  The patient's history has been reviewed, patient examined, no change in status, stable for surgery.  I have reviewed the patient's chart and labs.  Questions were answered to the patient's satisfaction.     [name]

## 2013-09-15 NOTE — ED Notes (Signed)
Patient placed on Zoll

## 2013-09-15 NOTE — CV Procedure (Signed)
    Cardiac Catheterization Operative Report  Anthony Skinner [id number] 10/1/20147:56 AM Robynn Pane, MD  Procedure Performed:  1. Left Heart Catheterization 2. Selective Coronary Angiography 3. Left ventricular angiogram  Primary Cardiologist: Dr. Sharyn Lull  Operator: Verne Carrow, MD  Arterial access site:  Right radial artery.   Indication: 72 yo male with history of CAD, HTN, HLD, GERD, recent suicide attempt by hanging who is brought to the cath lab from ED for emergent cardiac cath. Pt presented with 1 hour of chest pain, left shoulder pain. ST elevation inferior leads, slight change from old EKG which also showed ST elevation inferiorly. Emergent cath.                                Procedure Details: Emergency consent obtained.  The patient was further sedated with Versed and Fentanyl. The right wrist was assessed with an Allens test which was positive. The right wrist was prepped and draped in a sterile fashion. 1% lidocaine was used for local anesthesia. Using the modified Seldinger access technique, a 5 French sheath was placed in the right radial artery. 3 mg Verapamil was given through the sheath. He was started on an Angiomax drip after being given a weight based bolus. Standard diagnostic catheters were used to perform selective coronary angiography. A pigtail catheter was used to perform a left ventricular angiogram. The sheath was removed from the right radial artery and a Terumo hemostasis band was applied at the arteriotomy site on the right wrist.    There were no immediate complications. The patient was taken to the recovery area in stable condition.   Hemodynamic Findings: Central aortic pressure: 104/49 Left ventricular pressure: 110/8/18  Angiographic Findings:  Left main: No obstructive disease, short segment.   Left Anterior Descending Artery: Large caliber vessel that courses to the apex. 50% mid stenosis at the takeoff of the small caliber  diagonal branch.   Circumflex Artery: Very small in caliber without obstructive disease.   Intermediate Artery: Moderate caliber vessel with proximal 40% stenosis.   Right Coronary Artery: Large dominant vessel with 50% mid stenosis.   Left Ventricular Angiogram: LVEF=65%. No wall motion abnormality.   Impression: 1. Moderate non-obstructive CAD 2. Normal LV function 3. Non-cardiac chest pain 4. Chronic ST elevation inferior leads  Recommendations: Continue medical management of CAD. Dr. Sharyn Lull will assume care of this patient today. Further management per Dr. Sharyn Lull.        Complications:  None. The patient tolerated the procedure well.

## 2013-09-15 NOTE — Interval H&P Note (Signed)
History and Physical Interval Note:  09/15/2013 8:10 AM  Anthony Skinner  has presented today for cardiac cath with the diagnosis of stemi  The various methods of treatment have been discussed with the patient and family. After consideration of risks, benefits and other options for treatment, the patient has consented to  Procedure(s): LEFT HEART CATHETERIZATION WITH CORONARY ANGIOGRAM (N/A) as a surgical intervention .  The patient's history has been reviewed, patient examined, no change in status, stable for surgery.  I have reviewed the patient's chart and labs.  Questions were answered to the patient's satisfaction.    Cath Lab Visit (complete for each Cath Lab visit)  Clinical Evaluation Leading to the Procedure:   ACS: yes  Non-ACS:    Anginal Classification: CCS IV  Anti-ischemic medical therapy: No Therapy  Non-Invasive Test Results: No non-invasive testing performed  Prior CABG: No previous CABG        [name]

## 2013-09-15 NOTE — Progress Notes (Signed)
Subjective:  Appreciate Dr. Clifton James help. Patient presently denies any chest pain complains of left shoulder pain.  Objective:  Vital Signs in the last 24 hours: Temp:  [97.5 F (36.4 C)-97.8 F (36.6 C)] 97.5 F (36.4 C) (10/01 1610) Pulse Rate:  [80-90] 80 (10/01 0728) Resp:  [24] 24 (10/01 0712) BP: (128)/(57) 128/57 mmHg (10/01 0712) SpO2:  [99 %] 99 % (10/01 0712) Weight:  [70.761 kg (156 lb)] 70.761 kg (156 lb) (10/01 0712)  Intake/Output from previous day:   Intake/Output from this shift: Total I/O In: 360 [P.O.:360] Out: 900 [Urine:900]  Physical Exam: Neck: no adenopathy, no carotid bruit, no JVD and supple, symmetrical, trachea midline Lungs: clear to auscultation bilaterally Heart: regular rate and rhythm, S1, S2 normal, no murmur, click, rub or gallop Abdomen: soft, non-tender; bowel sounds normal; no masses,  no organomegaly Extremities: extremities normal, atraumatic, no cyanosis or edema and Right wrist site dry  Lab Results:  Recent Labs  09/15/13 0710 09/15/13 0729  WBC 4.8  --   HGB 14.0 14.3  PLT 172  --     Recent Labs  09/15/13 0710 09/15/13 0729  NA 137 141  K 4.4 4.3  CL 103 105  CO2 24  --   GLUCOSE 103* 103*  BUN 12 13  CREATININE 0.91 1.10   No results found for this basename: TROPONINI, CK, MB,  in the last 72 hours Hepatic Function Panel  Recent Labs  09/15/13 0710  PROT 6.5  ALBUMIN 3.1*  AST 23  ALT 16  ALKPHOS 65  BILITOT 0.2*   No results found for this basename: CHOL,  in the last 72 hours No results found for this basename: PROTIME,  in the last 72 hours  Imaging: Imaging results have been reviewed and No results found.  Cardiac Studies:  Assessment/Plan:  Status post atypical left chest left shoulder pain status post left cath  Moderate CAD Hypercholesteremia Tobacco abuse Dementia Status post recent suicidal attempt Degenerative joint disease Questionable seizure disorder Plan X-rays left  shoulder Continue home meds Possible discharge tomorrow if stable   LOS: 0 days    Marquies Wanat N 09/15/2013, 12:07 PM

## 2013-09-15 NOTE — H&P (Signed)
Patient ID: Anthony Skinner MRN: [id number], DOB/AGE: 1941/03/05   Admit date: 09/15/2013  Primary Physician: Robynn Pane, MD Primary Cardiologist: Judie Petit. Sharyn Lull, MD  Pt. Profile:  72 y/o male with h/o nonobs CAD and recent admission following suicide attempt who presented today with chest pain and inf ST elevation.  Problem List  Past Medical History  Diagnosis Date  . Iron deficiency anemia   . Tubular adenoma of colon 2012  . Gastropathy 2012    reactive  . PUD (peptic ulcer disease)   . Prostate cancer     a. 09/2008 s/p prostatectomy.  . CAD (coronary artery disease)     a. reported h/o MI in the 34's;  b. 04/2000 Cath: LM nl, LAD 40p, D1 small, nl, RI nl, LCX nl, RCA nl.  . Hyperlipidemia   . Vertebral artery stenosis     a. 09/2010 s/p L vertebral stenting 09/2010.  Marland Kitchen GERD (gastroesophageal reflux disease)   . Hiatal hernia   . Recurrent spontaneous pneumothorax     a. s/p L lobectomy in 1966.  Marland Kitchen DJD (degenerative joint disease)   . Hypertension   . Upper GI bleed     a. 2012  . Fatty liver   . Hypertension   . Syncope     a. in setting of GIB in 2012, presumed to be orthostatic.    Past Surgical History  Procedure Laterality Date  . Prostatectomy    . Vertebral artery stent    . Lung removal, partial  1960s    left  . Cataract extraction Right   . Cardiac surgery      Allergies  No Known Allergies  HPI  72 y/o male with the above problem list.  He reports remote "small" MI in the 1's and had a nonobstructive cath in 2001.  He was admitted earlier this month following suicide attempt with subsequent resp failure and brief intubation.  He was medically cleared for psych admission to [facility] gero-psych ward on 9/16.  Details are not clear at this point, but he has since been d/c'd to home from there.  This AM, he awoke with left shoulder pain and dyspnea.  EMS was called and ECG showed inferior ST elevation.  Code STEMI was called.  Upon  arrival to the ED, pt cont to c/o left shoulder pain, which was worse with palpation.  He was taken to the cath lab emergently.  Home Medications  Prior to Admission medications   Medication Sig Start Date End Date Taking? Authorizing Provider  aspirin 325 MG tablet Take 325 mg by mouth daily.    Historical Provider, MD  cyanocobalamin ([employer] VITAMIN B12) 1000 MCG tablet Take 1000 mcg po daily 04/06/13   Hart Carwin, MD  LORazepam (ATIVAN) 0.5 MG tablet TAKE 1/2 TO 1 TABLET EVERY NIGHT FOR MUSCLE RELAXATION 03/04/13   Hart Carwin, MD  rosuvastatin (CRESTOR) 10 MG tablet Take 10 mg by mouth daily.    Historical Provider, MD    Family History  Family History  Problem Relation Age of Onset  . Heart attack Father   . Alzheimer's disease Mother   . Prostate cancer Brother     Social History  History   Social History  . Marital Status: Married    Spouse Name: N/A    Number of Children: 0  . Years of Education: N/A   Occupational History  . retired    Social History Main Topics  . Smoking status:  Former Smoker -- 1.00 packs/day for 40 years    Types: Cigarettes    Quit date: 08/27/2002  . Smokeless tobacco: Not on file  . Alcohol Use: No     Comment: Hx heavy EtOH use but quit 2011  . Drug Use: No  . Sexual Activity: Not on file   Other Topics Concern  . Not on file   Social History Narrative   Lives in [place] with wife.  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         Review of Systems General:  No chills, fever, night sweats or weight changes.  Cardiovascular:  +++ chest pain, dyspnea on exertion, edema, orthopnea, palpitations, paroxysmal nocturnal dyspnea. Dermatological: No rash, lesions/masses Respiratory: No cough, dyspnea Urologic: No hematuria, dysuria Abdominal:   No nausea, vomiting, diarrhea, bright red blood per rectum, melena, or hematemesis Neurologic:  No visual changes, wkns, changes in mental status. All other systems reviewed and are  otherwise negative except as noted above.  Physical Exam  Blood pressure 128/57, pulse 90, temperature 97.8 F (36.6 C), resp. rate 24, height 6' (1.829 m), weight 156 lb (70.761 kg), SpO2 99.00%.  General: Pleasant, NAD Psych: flat affect. Neuro: Alert and oriented X 3. Moves all extremities spontaneously. HEENT: Normal  Neck: Supple without bruits or JVD. Lungs:  Resp regular and unlabored, CTA. Heart: RRR no s3, s4, or murmurs. Abdomen: Soft, non-tender, non-distended, BS + x 4.  Extremities: No clubbing, cyanosis or edema. DP/PT/Radials 2+ and equal bilaterally.  Labs  Pending  ECG  Rsr, 88, slight inf ST elevation.  ASSESSMENT AND PLAN  1.  Acute coronary syndrome: Emergent cath.  Plan to admit and cycle CE.  Transition care to Dr. Sharyn Lull, who is his primary cardiologist.  More recs following cath.  2.  Depression h/o suicide attempt:  Cont home meds, once they are available.  Signed, Nicolasa Ducking, NP 09/15/2013, 7:34 AM  I have personally seen and examined this patient with Ward Givens, NP. I agree with the assessment and plan as outlined above. Emergent cath with ongoing shoulder pain, ST elevation in inferior leads. Pt of Dr. Sharyn Lull. We are asked to assist with the cardiac catheterization this am.   [facility] 09/15/2013 7:59 AM

## 2013-09-15 NOTE — Progress Notes (Signed)
Met pt's wife in ED when she arrived. Pt's wife was anxious to know husband's condition. She shared with me pt's recent attempt to commit suicide and that he had been dischargeded from rehab yesterday. I escorted pt's wife to consult room and located cath lab physician to give update. Marjory Lies Chaplain  09/15/13 0900  Clinical Encounter Type  Visited With Family

## 2013-09-15 NOTE — ED Notes (Signed)
Patient arrived via EMS.  Original call was for dizziness.  Upon EMS arrival they found him sitting on the floor c/o dizziness no N/V, SOB, diaphoretic  Upon getting in the ambulance c/o left shoulder pain 8/10 recd 2 NTG and 324mg  ASA and that changed him to a 6/10.  2 weeks ago tried to hang himself and his wife found him and got him down and was seen for the same

## 2013-09-16 LAB — BASIC METABOLIC PANEL
BUN: 13 mg/dL (ref 6–23)
CO2: 27 mEq/L (ref 19–32)
Calcium: 8.5 mg/dL (ref 8.4–10.5)
Chloride: 105 mEq/L (ref 96–112)
Creatinine, Ser: 0.92 mg/dL (ref 0.50–1.35)
GFR calc Af Amer: >90 mL/min (ref >90)
GFR calc non Af Amer: 83 mL/min — ABNORMAL LOW (ref >90)
Glucose, Bld: 81 mg/dL (ref 70–99)
Potassium: 3.9 mEq/L (ref 3.5–5.1)
Sodium: 140 mEq/L (ref 135–145)

## 2013-09-16 LAB — CBC
HCT: 38.7 % — ABNORMAL LOW (ref 39.0–52.0)
Hemoglobin: 13.1 g/dL (ref 13.0–17.0)
MCH: 28.6 pg (ref 26.0–34.0)
MCHC: 33.9 g/dL (ref 30.0–36.0)
MCV: 84.5 fL (ref 78.0–100.0)
Platelets: 181 10*3/uL (ref 150–400)
RBC: 4.58 MIL/uL (ref 4.22–5.81)
RDW: 15.4 % (ref 11.5–15.5)
WBC: 3.9 10*3/uL — ABNORMAL LOW (ref 4.0–10.5)

## 2013-09-16 LAB — TROPONIN I: Troponin I: <0.3 ng/mL (ref <0.30)

## 2013-09-16 MED ORDER — TRAMADOL HCL 50 MG PO TABS: 50.0000 mg | ORAL_TABLET | Freq: Three times a day (TID) | ORAL | Status: DC | PRN

## 2013-09-16 MED FILL — Sodium Chloride IV Soln 0.9%: INTRAVENOUS | Qty: 50 | Status: AC

## 2013-09-16 NOTE — Discharge Summary (Signed)
  Discharge summary dictated on 09/16/2013 dictation number is [phone]

## 2013-09-16 NOTE — Care Management Note (Signed)
    Page 1 of 2   09/16/2013     3:24:30 PM   CARE MANAGEMENT NOTE 09/16/2013  Patient:  Anthony Skinner, Anthony Skinner   Account Number:  [id number]  Date Initiated:  09/16/2013  Documentation initiated by:  [facility]  Subjective/Objective Assessment:   Admitted  with STEMI//home with wife     Action/Plan:   cardiac cath//home with Home Health   Anticipated DC Date:  09/16/2013   Anticipated DC Plan:  HOME W HOME HEALTH SERVICES  In-house referral  Clinical Social Worker      DC Associate Professor  CM consult      [facility] Choice  HOME HEALTH   Choice offered to / List presented to:  C-3 Spouse        HH arranged  HH-1 RN  HH-4 NURSE'S AIDE  HH-6 SOCIAL WORKER      HH agency  [facility]   Status of service:  Completed, signed off Medicare Important Message given?   (If response is "NO", the following Medicare IM given date fields will be blank) Date Medicare IM given:   Date Additional Medicare IM given:    Discharge Disposition:    Per UR Regulation:    If discussed at Long Length of Stay Meetings, dates discussed:    Comments:  09/16/13 1115 Oletta Cohn, RN, BSN, NCM [phone] CM spoke with patient concerning discharge planning. Pt offered choice for [facility] for [facility] services upon discharge. Per pt choice [facility] to provide [facility] services.  [facility] rep  contacted concerning new referral. Pt to discharge home alone. Pt request HHRN , NA and SW upon discharge. No DME needs identified at this time.

## 2013-09-16 NOTE — Progress Notes (Signed)
Brief Nutrition Note:   RD pulled to chart for positive malnutrition screening tool. Pt indicated "unsure" for possible unintentional weight loss. Weight hx reviewed, weight has been trending up.  Wt Readings from Last 10 Encounters:  09/16/13 157 lb 10.1 oz (71.5 kg)  09/16/13 157 lb 10.1 oz (71.5 kg)  08/27/13 149 lb 7.6 oz (67.8 kg)  08/19/13 146 lb (66.225 kg)  08/02/13 150 lb (68.04 kg)  07/15/13 150 lb (68.04 kg)  02/23/13 156 lb (70.761 kg)  12/30/12 149 lb 8 oz (67.813 kg)  Diet: heart healthy, ate 100% of last meal.   Chart reviewed, no nutrition interventions warranted at this time. Please consult as needed.   Isabell Jarvis RD, LDN Pager [phone] After Hours pager [phone]

## 2013-09-16 NOTE — Clinical Social Work Psychosocial (Signed)
Clinical Social Work Department BRIEF PSYCHOSOCIAL ASSESSMENT 09/16/2013  Patient:  Anthony Skinner, Anthony Skinner     Account Number:  [id number]     Admit date:  09/15/2013  Clinical Social Worker:  Delmer Islam  Date/Time:  09/16/2013 02:47 PM  Referred by:  RN  Date Referred:  09/16/2013 Referred for  Other - See comment   Other Referral:   Patients wife, Gracey Goldammer wanted to speak with social worker about getting Medicaid/financial assistance.   Interview type:  Patient Other interview type:   Patients wife Jayse Hodkinson, her number is [phone].    PSYCHOSOCIAL DATA Living Status:  WIFE Admitted from facility:   Level of care:   Primary support name:  Gracey Sine Primary support relationship to patient:  SPOUSE Degree of support available:   Patient's wife is very supportive of her husband.    CURRENT CONCERNS Current Concerns  Financial Resources   Other Concerns:   Patient and wife were seeking assistance with hopstial and medical bills.    SOCIAL WORK ASSESSMENT / PLAN CSW got a call from patient's nurse because patient's wife requested to talk with the social worker. CSW introduce self to patient and patient wife. Patient and wife were thrilled and open to talk with CSW. Patient's wife advised CSW that they  are concerned with their medical bills and seeking assistance.  Patient's wife, Gracey informed CSW that she and the husband received thearpy weekly, after his recent suicide attempt. Patient and wife commute to [place] for thearpy. They are looking for something closer, and CSW advised them to talk with therapist about switching to a therapist in [facility]. CSW also talked with patient and wife about applying for Medicaid and meeting a deductible to assist in paying his medical bills. CSW intern gave the patient and wife CSW's contact information for any other assistance.   Assessment/plan status:  Psychosocial Support/Ongoing Assessment of  Needs Other assessment/ plan:   Information/referral to community resources:    PATIENT'S/FAMILY'S RESPONSE TO PLAN OF CARE: Patient and wife, Gracey were very appreciative of CSW visit and information shared. Patient's wife has CSW 's contact information for any additional assistance.      Deniece Ree, CSW  Reviewed and approved by Genelle Bal, LCSW

## 2013-09-17 NOTE — Discharge Summary (Signed)
NAME:  Anthony Skinner, Anthony Skinner                ACCOUNT NO.:  [id number]  MEDICAL RECORD NO.:  [id number]  LOCATION:  6C01C                        FACILITY:  [facility]  PHYSICIAN:  Anelle Parlow N. Sharyn Lull, M.D. DATE OF BIRTH:  06/12/1941  DATE OF ADMISSION:  09/15/2013 DATE OF DISCHARGE:  09/16/2013                              DISCHARGE SUMMARY   ADMITTING DIAGNOSES: 1. Acute coronary syndrome. 2. Depression with history of suicidal attempt.  DISCHARGE DIAGNOSES: 1. Status post chest pain, left shoulder pain, myocardial infarction     ruled out.  Status post left catheterization. 2. Moderate coronary artery disease. 3. Hypercholesteremia. 4. History of tobacco abuse. 5. Dementia. 6. Status post recent suicidal attempt. 7. Degenerative joint disease. 8. Questionable seizure disorder.  DISCHARGE HOME MEDICATIONS:  Tramadol 50 mg 1 tablet every 8 hours as needed for pain, aspirin 325 mg 1 tablet daily as before, Depakote ER 1500 mg every evening as before, Aricept 5 mg daily as before. 8, Lorazepam 0.5 mg every 8 hours as before, Risperdal 1.5 mg at bedtime as before, and Crestor 10 mg 1 tablet daily.  DIET:  Low salt, low cholesterol.  ACTIVITY:  Increase activity as tolerated.  DISCHARGE INSTRUCTIONS:  Post cardiac cath instructions have been given. Follow up with me in 2 weeks.  Follow up with psych as scheduled in [place].  CONDITION AT DISCHARGE:  Stable.  The patient appears not to be suicidal.  BRIEF HISTORY AND HOSPITAL COURSE:  [name] is a 72 year old male with past medical history significant for mild-to-moderate coronary artery disease, hypercholesteremia, tobacco abuse, dementia, status post recent suicidal, degenerative joint disease, questionable seizure disorder, came to the ER by EMS as code STEMI was called.  The patient complained of left-sided chest pain, left shoulder pain, EKG done on the field showed minimal ST elevation and inferior leads with  minimal reciprocal changes in high lateral leads.  The patient subsequently was taken to cath lab by Dr. Clifton James, was noted to have moderate mid LAD and RCA stenosis as per procedure report.  His three sets of cardiac enzymes were negative.  Upon reviewing old records, the patient had EKG done approximately 2 weeks ago, which showed similar findings.  The patient did not have any episodes of chest pain during the hospital stay.  His three sets of cardiac enzymes are negative.  The patient has been ambulating in room without any problems.  His shoulder x-ray showed degenerative changes involving the [facility] joint.  There was no evidence of dislocation or fracture.  The patient's shoulder pain has markedly improved.  The patient will be discharged home on above medications and will be followed up in my office in 2 weeks and with psych as scheduled.     [name], M.D.     [facility]  D:  09/16/2013  T:  09/17/2013  Job:  [id number]

## 2013-09-20 ENCOUNTER — Ambulatory Visit: Payer: Medicare Other

## 2013-09-29 ENCOUNTER — Emergency Department (HOSPITAL_COMMUNITY): Payer: Medicare Other

## 2013-09-29 ENCOUNTER — Emergency Department (HOSPITAL_COMMUNITY)
Admission: EM | Admit: 2013-09-29 | Discharge: 2013-09-29 | Disposition: A | Discharge status: Home / Self Care | Payer: Medicare Other | Attending: Emergency Medicine | Admitting: Emergency Medicine

## 2013-09-29 ENCOUNTER — Encounter (HOSPITAL_COMMUNITY): Payer: Self-pay | Admitting: Emergency Medicine

## 2013-09-29 DIAGNOSIS — F0280 Dementia in other diseases classified elsewhere without behavioral disturbance: Secondary | ICD-10-CM | POA: Insufficient documentation

## 2013-09-29 DIAGNOSIS — I252 Old myocardial infarction: Secondary | ICD-10-CM | POA: Insufficient documentation

## 2013-09-29 DIAGNOSIS — R55 Syncope and collapse: Secondary | ICD-10-CM | POA: Insufficient documentation

## 2013-09-29 DIAGNOSIS — Z7982 Long term (current) use of aspirin: Secondary | ICD-10-CM | POA: Insufficient documentation

## 2013-09-29 DIAGNOSIS — Z8659 Personal history of other mental and behavioral disorders: Secondary | ICD-10-CM | POA: Insufficient documentation

## 2013-09-29 DIAGNOSIS — Z8639 Personal history of other endocrine, nutritional and metabolic disease: Secondary | ICD-10-CM | POA: Insufficient documentation

## 2013-09-29 DIAGNOSIS — I10 Essential (primary) hypertension: Secondary | ICD-10-CM | POA: Insufficient documentation

## 2013-09-29 DIAGNOSIS — Z79899 Other long term (current) drug therapy: Secondary | ICD-10-CM | POA: Insufficient documentation

## 2013-09-29 DIAGNOSIS — Z87891 Personal history of nicotine dependence: Secondary | ICD-10-CM | POA: Insufficient documentation

## 2013-09-29 DIAGNOSIS — M1990 Unspecified osteoarthritis, unspecified site: Secondary | ICD-10-CM | POA: Insufficient documentation

## 2013-09-29 DIAGNOSIS — W19XXXA Unspecified fall, initial encounter: Secondary | ICD-10-CM

## 2013-09-29 DIAGNOSIS — W1809XA Striking against other object with subsequent fall, initial encounter: Secondary | ICD-10-CM | POA: Insufficient documentation

## 2013-09-29 DIAGNOSIS — IMO0002 Reserved for concepts with insufficient information to code with codable children: Secondary | ICD-10-CM | POA: Insufficient documentation

## 2013-09-29 DIAGNOSIS — I2510 Atherosclerotic heart disease of native coronary artery without angina pectoris: Secondary | ICD-10-CM | POA: Insufficient documentation

## 2013-09-29 DIAGNOSIS — S0001XA Abrasion of scalp, initial encounter: Secondary | ICD-10-CM

## 2013-09-29 DIAGNOSIS — Z9861 Coronary angioplasty status: Secondary | ICD-10-CM | POA: Insufficient documentation

## 2013-09-29 DIAGNOSIS — Y9301 Activity, walking, marching and hiking: Secondary | ICD-10-CM | POA: Insufficient documentation

## 2013-09-29 DIAGNOSIS — G8929 Other chronic pain: Secondary | ICD-10-CM | POA: Insufficient documentation

## 2013-09-29 DIAGNOSIS — Z8546 Personal history of malignant neoplasm of prostate: Secondary | ICD-10-CM | POA: Insufficient documentation

## 2013-09-29 DIAGNOSIS — G309 Alzheimer's disease, unspecified: Secondary | ICD-10-CM | POA: Insufficient documentation

## 2013-09-29 DIAGNOSIS — M25519 Pain in unspecified shoulder: Secondary | ICD-10-CM | POA: Insufficient documentation

## 2013-09-29 DIAGNOSIS — S298XXA Other specified injuries of thorax, initial encounter: Secondary | ICD-10-CM | POA: Insufficient documentation

## 2013-09-29 DIAGNOSIS — Z9889 Other specified postprocedural states: Secondary | ICD-10-CM | POA: Insufficient documentation

## 2013-09-29 DIAGNOSIS — Z8709 Personal history of other diseases of the respiratory system: Secondary | ICD-10-CM | POA: Insufficient documentation

## 2013-09-29 DIAGNOSIS — Z8719 Personal history of other diseases of the digestive system: Secondary | ICD-10-CM | POA: Insufficient documentation

## 2013-09-29 DIAGNOSIS — Y92009 Unspecified place in unspecified non-institutional (private) residence as the place of occurrence of the external cause: Secondary | ICD-10-CM | POA: Insufficient documentation

## 2013-09-29 DIAGNOSIS — Z862 Personal history of diseases of the blood and blood-forming organs and certain disorders involving the immune mechanism: Secondary | ICD-10-CM | POA: Insufficient documentation

## 2013-09-29 LAB — POCT I-STAT, CHEM 8
BUN: 12 mg/dL (ref 6–23)
Calcium, Ion: 1.22 mmol/L (ref 1.13–1.30)
Chloride: 105 mEq/L (ref 96–112)
Creatinine, Ser: 1.2 mg/dL (ref 0.50–1.35)
Glucose, Bld: 88 mg/dL (ref 70–99)
HCT: 39 % (ref 39.0–52.0)
Hemoglobin: 13.3 g/dL (ref 13.0–17.0)
Potassium: 4.2 mEq/L (ref 3.5–5.1)
Sodium: 140 mEq/L (ref 135–145)
TCO2: 28 mmol/L (ref 0–100)

## 2013-09-29 LAB — CBC WITH DIFFERENTIAL/PLATELET
Basophils Absolute: 0 10*3/uL (ref 0.0–0.1)
Basophils Relative: 1 % (ref 0–1)
Eosinophils Absolute: 0.3 10*3/uL (ref 0.0–0.7)
Eosinophils Relative: 6 % — ABNORMAL HIGH (ref 0–5)
HCT: 38.6 % — ABNORMAL LOW (ref 39.0–52.0)
Hemoglobin: 13.1 g/dL (ref 13.0–17.0)
Lymphocytes Relative: 31 % (ref 12–46)
Lymphs Abs: 1.4 10*3/uL (ref 0.7–4.0)
MCH: 28.7 pg (ref 26.0–34.0)
MCHC: 33.9 g/dL (ref 30.0–36.0)
MCV: 84.5 fL (ref 78.0–100.0)
Monocytes Absolute: 1.1 10*3/uL — ABNORMAL HIGH (ref 0.1–1.0)
Monocytes Relative: 24 % — ABNORMAL HIGH (ref 3–12)
Neutro Abs: 1.7 10*3/uL (ref 1.7–7.7)
Neutrophils Relative %: 39 % — ABNORMAL LOW (ref 43–77)
Platelets: 149 10*3/uL — ABNORMAL LOW (ref 150–400)
RBC: 4.57 MIL/uL (ref 4.22–5.81)
RDW: 15.5 % (ref 11.5–15.5)
WBC: 4.4 10*3/uL (ref 4.0–10.5)

## 2013-09-29 LAB — POCT I-STAT TROPONIN I: Troponin i, poc: 0.01 ng/mL (ref 0.00–0.08)

## 2013-09-29 MED ORDER — RISPERIDONE 0.5 MG PO TABS: 1.0000 mg | ORAL_TABLET | Freq: Every day | ORAL | Status: DC

## 2013-09-29 NOTE — ED Notes (Addendum)
Syncope PTA, here by EMS, alert, NAD, calm, interactive. Mentions sharp CP for a few seconds earlier this am when he took a deep breath. Had a syncopal episode fell and hit head, head lac noted to L parietal area ~1". Denies CP at this time. Admits to small HA. Concern for ST elevation, EKG transmitted and received PTA.

## 2013-09-29 NOTE — ED Provider Notes (Signed)
CSN: [id number]     Arrival date & time 09/29/13  0703 History   First MD Initiated Contact with Patient 09/29/13 [phone]     Chief Complaint  Patient presents with  . Loss of Consciousness   (Consider location/radiation/quality/duration/timing/severity/associated sxs/prior Treatment) HPI Pt is a 72yo male with hx of CAD, vertebral artery stenosis, presumed orthostatic syncope in 2012, MI, and newly dx early stages of Alzheimer's  BIB by EMS after syncopal episode at home where pt hit the back of his head on some furniture.  Pt has laceration to back of head.  Pt states when he woke up this morning to walk to the bathroom he passed out.  He does not recall how he felt just prior to passing out but remembers waking up with centralized sharp chest pain that did not radiate, lasted "a few seconds" and only occurred with deep inspiration.  Pt was just admitted on 09/15/13 and discharged 09/16/13 for ACS. Daughter reports concern for head injury as pt has stents placed in head 17yrs ago after aneurysm.  Also reports pt has had several episodes of falling within the last 5-6 weeks.  Daughter also mentions pt was recently hospitalized with [facility] for suicide attempt, since then pt was placed on new medication daughter thinks may be contributing to pt's recent falls.  States she thinks he needs a walker at home.  Pt denies any pain at this time.  Reports being on aspirin but no other known blood thinners.      PCP and Cardiologist: Dr. Sharyn Lull.   Past Medical History  Diagnosis Date  . Iron deficiency anemia   . Tubular adenoma of colon 2012  . Gastropathy 2012    reactive  . PUD (peptic ulcer disease)   . Prostate cancer     a. 09/2008 s/p prostatectomy.  . CAD (coronary artery disease)     a. reported h/o MI in the 44's;  b. 04/2000 Cath: LM nl, LAD 40p, D1 small, nl, RI nl, LCX nl, RCA nl.  . Hyperlipidemia   . Vertebral artery stenosis     a. 09/2010 s/p L vertebral stenting 09/2010.  Marland Kitchen GERD  (gastroesophageal reflux disease)   . Hiatal hernia   . Recurrent spontaneous pneumothorax     a. s/p L lobectomy in 1966.  Marland Kitchen DJD (degenerative joint disease)   . Hypertension   . Upper GI bleed     a. 2012  . Fatty liver   . Hypertension   . Syncope     a. in setting of GIB in 2012, presumed to be orthostatic.  . Suicide attempt     a. 08/2013 attempt by hanging with subsequent resp failure  . Myocardial infarction     " BACK IN THE 90'S"  . Dementia   . Shortness of breath    Past Surgical History  Procedure Laterality Date  . Prostatectomy    . Vertebral artery stent    . Lung removal, partial  1960s    left  . Cataract extraction Right   . Cardiac surgery    . Cardiac catheterization  09/15/2013   Family History  Problem Relation Age of Onset  . Heart attack Father   . Alzheimer's disease Mother   . Prostate cancer Brother    History  Substance Use Topics  . Smoking status: Former Smoker -- 1.00 packs/day for 40 years    Types: Cigarettes    Quit date: 08/27/2002  . Smokeless tobacco: Never Used  .  Alcohol Use: No     Comment: Hx heavy EtOH use but quit 2011    Review of Systems  Respiratory: Negative for shortness of breath.   Cardiovascular: Positive for chest pain.  Skin: Positive for wound.  Neurological: Positive for syncope. Negative for headaches.  All other systems reviewed and are negative.    Allergies  Review of patient's allergies indicates no known allergies.  Home Medications   Current Outpatient Rx  Name  Route  Sig  Dispense  Refill  . aspirin 325 MG tablet   Oral   Take 325 mg by mouth daily.         . divalproex (DEPAKOTE ER) 500 MG 24 hr tablet   Oral   Take 1,500 mg by mouth every evening.         . donepezil (ARICEPT) 5 MG tablet   Oral   Take 5 mg by mouth daily.         Marland Kitchen LORazepam (ATIVAN) 0.5 MG tablet   Oral   Take 0.5 mg by mouth every 8 (eight) hours as needed for anxiety.          . rosuvastatin  (CRESTOR) 10 MG tablet   Oral   Take 10 mg by mouth daily.         . traMADol (ULTRAM) 50 MG tablet   Oral   Take 1 tablet (50 mg total) by mouth every 8 (eight) hours as needed for pain.   60 tablet   1   . risperiDONE (RISPERDAL) 0.5 MG tablet   Oral   Take 2 tablets (1 mg total) by mouth at bedtime.   30 tablet   0    BP 109/58  Pulse 65  Temp(Src) 98 F (36.7 C) (Oral)  Resp 16  SpO2 100% Physical Exam  Nursing note and vitals reviewed. Constitutional: He is oriented to person, place, and time. He appears well-developed and well-nourished.  Elderly male lying in exam bed, NAD. Laceration to back top of left scalp visible.  HENT:  Head: Normocephalic. Head is with abrasion.    Right Ear: Hearing, tympanic membrane, external ear and ear canal normal.  Left Ear: Hearing, tympanic membrane, external ear and ear canal normal.  Nose: Nose normal.  Mouth/Throat: Uvula is midline, oropharynx is clear and moist and mucous membranes are normal.  Eyes: Conjunctivae and EOM are normal. Pupils are equal, round, and reactive to light. No scleral icterus.  Neck: Normal range of motion. Neck supple.  Mild TTP right side of neck. No midline bone tenderness, no crepitus or step-offs. FROM without pain.  Cardiovascular: Normal rate, regular rhythm and normal heart sounds.   Pulmonary/Chest: Effort normal and breath sounds normal. No respiratory distress. He has no wheezes. He has no rales. He exhibits no tenderness.  Abdominal: Soft. Bowel sounds are normal. He exhibits no distension and no mass. There is no tenderness. There is no rebound and no guarding.  Musculoskeletal: Normal range of motion.  Neurological: He is alert and oriented to person, place, and time. No cranial nerve deficit.  Skin: Skin is warm and dry.    ED Course  Procedures (including critical care time) Labs Review Labs Reviewed  CBC WITH DIFFERENTIAL - Abnormal; Notable for the following:    HCT 38.6 (*)     Platelets 149 (*)    Neutrophils Relative % 39 (*)    Monocytes Relative 24 (*)    Monocytes Absolute 1.1 (*)    Eosinophils Relative 6 (*)  All other components within normal limits  POCT I-STAT, CHEM 8  POCT I-STAT TROPONIN I   Imaging Review Ct Head Wo Contrast  09/29/2013   CLINICAL DATA:  Syncopal episode with fall today. Loss of consciousness. History of prostate cancer.  EXAM: CT HEAD WITHOUT CONTRAST  CT CERVICAL SPINE WITHOUT CONTRAST  TECHNIQUE: Multidetector CT imaging of the head and cervical spine was performed following the standard protocol without intravenous contrast. Multiplanar CT image reconstructions of the cervical spine were also generated.  COMPARISON:  Head CT 07/07/2011. MRI brain 07/20/2013.  FINDINGS: CT HEAD FINDINGS  There is no evidence of acute intracranial hemorrhage, mass lesion, brain edema or extra-axial fluid collection. The ventricles and subarachnoid spaces are appropriately sized for age. There is no CT evidence of acute cortical infarction.  The visualized paranasal sinuses, mastoid air cells and middle ears are clear. The calvarium is intact.  CT CERVICAL SPINE FINDINGS  The cervical alignment is normal. There is no evidence of acute fracture or traumatic subluxation. There is disc space loss with uncinate spurring, most advanced from C4-5 through C6-7. There is mild resulting osseous foraminal narrowing bilaterally at C4-5. There is mild ossification of the ligamentum nuchae.  Scattered vascular calcifications are noted. Biapical pulmonary scarring is unchanged from recent chest CT.  IMPRESSION: 1. Stable unremarkable head CT. No acute intracranial findings. 2. No acute cervical spine findings. Mild spondylosis as described.   Electronically Signed   By: Roxy Horseman M.D.   On: 09/29/2013 08:41   Ct Cervical Spine Wo Contrast  09/29/2013   CLINICAL DATA:  Syncopal episode with fall today. Loss of consciousness. History of prostate cancer.  EXAM: CT HEAD  WITHOUT CONTRAST  CT CERVICAL SPINE WITHOUT CONTRAST  TECHNIQUE: Multidetector CT imaging of the head and cervical spine was performed following the standard protocol without intravenous contrast. Multiplanar CT image reconstructions of the cervical spine were also generated.  COMPARISON:  Head CT 07/07/2011. MRI brain 07/20/2013.  FINDINGS: CT HEAD FINDINGS  There is no evidence of acute intracranial hemorrhage, mass lesion, brain edema or extra-axial fluid collection. The ventricles and subarachnoid spaces are appropriately sized for age. There is no CT evidence of acute cortical infarction.  The visualized paranasal sinuses, mastoid air cells and middle ears are clear. The calvarium is intact.  CT CERVICAL SPINE FINDINGS  The cervical alignment is normal. There is no evidence of acute fracture or traumatic subluxation. There is disc space loss with uncinate spurring, most advanced from C4-5 through C6-7. There is mild resulting osseous foraminal narrowing bilaterally at C4-5. There is mild ossification of the ligamentum nuchae.  Scattered vascular calcifications are noted. Biapical pulmonary scarring is unchanged from recent chest CT.  IMPRESSION: 1. Stable unremarkable head CT. No acute intracranial findings. 2. No acute cervical spine findings. Mild spondylosis as described.   Electronically Signed   By: Roxy Horseman M.D.   On: 09/29/2013 08:41   Dg Chest Port 1 View  09/29/2013   CLINICAL DATA:  Loss of consciousness. Fall.  EXAM: PORTABLE CHEST - 1 VIEW  COMPARISON:  05/02/2011  FINDINGS: Chronic lung disease with COPD and calcified granulomata. Negative for heart failure or pneumonia. Lungs are clear.  IMPRESSION: COPD. No acute cardiopulmonary abnormality.   Electronically Signed   By: Marlan Palau M.D.   On: 09/29/2013 07:28    EKG Interpretation   None       MDM   1. Fall, initial encounter   2. Scalp abrasion, initial encounter  3. Chronic left shoulder pain    Pt and daughter  report increase in falling when getting up in the morning. Denies chest pain, palpitations or diaphoresis just prior to falling episodes.  Will perform cardiac workup including orthostatic vital signs.  Will get basic labs and head/neck CT due to head trauma from fall.  Workup= unremarkable.  [facility], placed on Risperdal, symptoms worsened after addition to this medication.  Dr. Patria Mane also examined pt, will decrease Risperdal of 1.5mg  nightly to 1.0 at night. F/u with Dr. Sharyn Lull for f/u.  Also will refer pt to [facility] for ongoing left shoulder pain which was mentioned towards end of pt's visit in ED. Pt discharged home in stable condition. Daughter stated they do have a walker at home for pt to use. Pt and daughter verbalized understanding and agreement with tx plan.    Junius Finner, PA-C 09/30/13 1234

## 2013-10-01 NOTE — ED Provider Notes (Signed)
Medical screening examination/treatment/procedure(s) were conducted as a shared visit with non-physician practitioner(s) and myself.  I personally evaluated the patient during the encounter  Likely secondary to risperdal as fall are in AM. Doubt cardiogenic syncope. Will decrease risperdal. pcp and psych follow up   Lyanne Co, MD 10/01/13 1625

## 2013-12-20 ENCOUNTER — Ambulatory Visit: Payer: Medicare Other | Admitting: Neurology

## 2014-05-23 DIAGNOSIS — C61 Malignant neoplasm of prostate: Secondary | ICD-10-CM | POA: Insufficient documentation

## 2014-11-24 ENCOUNTER — Encounter (HOSPITAL_COMMUNITY): Payer: Self-pay | Admitting: Cardiovascular Disease

## 2014-11-28 DIAGNOSIS — N393 Stress incontinence (female) (male): Secondary | ICD-10-CM | POA: Insufficient documentation

## 2014-12-12 ENCOUNTER — Ambulatory Visit: Payer: Medicare Other | Admitting: Diagnostic Neuroimaging

## 2014-12-14 ENCOUNTER — Encounter: Payer: Self-pay | Admitting: Diagnostic Neuroimaging

## 2014-12-21 ENCOUNTER — Ambulatory Visit (INDEPENDENT_AMBULATORY_CARE_PROVIDER_SITE_OTHER): Payer: Medicare Other | Admitting: Diagnostic Neuroimaging

## 2014-12-21 ENCOUNTER — Encounter: Payer: Self-pay | Admitting: Diagnostic Neuroimaging

## 2014-12-21 VITALS — BP 114/64 | HR 57 | Temp 97.6°F | Ht 72.0 in | Wt 167.2 lb

## 2014-12-21 DIAGNOSIS — G8190 Hemiplegia, unspecified affecting unspecified side: Secondary | ICD-10-CM

## 2014-12-21 DIAGNOSIS — R413 Other amnesia: Secondary | ICD-10-CM

## 2014-12-21 DIAGNOSIS — G8191 Hemiplegia, unspecified affecting right dominant side: Secondary | ICD-10-CM

## 2014-12-21 NOTE — Patient Instructions (Signed)
I will check MRI brain.

## 2014-12-21 NOTE — Progress Notes (Signed)
[facility]  PATIENT: Anthony Skinner DOB: 1941-03-19  REFERRING CLINICIAN: Para Skeans HISTORY FROM: patient and wife  REASON FOR VISIT: new consult    HISTORICAL  CHIEF COMPLAINT:  Chief Complaint  Patient presents with  . Neurologic Problem    HISTORY OF PRESENT ILLNESS:   74 year old right-handed male with hypercholesterolemia, left proximal vertebral artery stent, suicide attempt in 2014, here for evaluation of memory loss.  Patient has remote history of traumatic childhood, alcoholic father, absent mother, chronic alcohol abuse until quitting 9 years ago, had developed memory loss in 2012. This started following left vertebral artery stenting procedure apparently. Patient had several syncopal events without clear cardiac etiology. Patient was found to have left vertebral artery stenosis, which was treated with stenting procedure.  Patient then developed short-term memory problems and confusion. Patient also developed auditory hallucinations, depression, anxiety in August 2014. This ultimately led to suicide attempt in September 2014 or patient tried to hang himself. Patient was found by his family, who were able to cut him down and taken to the hospital. Patient was unresponsive and treated in intensive care. He had a poor prognosis but was fortunately able to survive and recover. Since that time he has had additional memory loss and confusion. At some point he was diagnosed with possible dementia but this is not clear. Patient is also on medication for depression.   REVIEW OF SYSTEMS: Full 14 system review of systems performed and notable only for weight loss hearing loss leg cramps shortness of breath memory loss confusion depression anxiety change in appetite suicidal thoughts.  ALLERGIES: No Known Allergies  HOME MEDICATIONS: Outpatient Prescriptions Prior to Visit  Medication Sig Dispense Refill  . risperiDONE (RISPERDAL) 0.5 MG tablet Take 2 tablets (1  mg total) by mouth at bedtime. 30 tablet 0  . rosuvastatin (CRESTOR) 10 MG tablet Take 10 mg by mouth daily.    Marland Kitchen aspirin 325 MG tablet Take 325 mg by mouth daily.    Marland Kitchen donepezil (ARICEPT) 5 MG tablet Take 5 mg by mouth daily.    Marland Kitchen LORazepam (ATIVAN) 0.5 MG tablet Take 0.5 mg by mouth every 8 (eight) hours as needed for anxiety.     . divalproex (DEPAKOTE ER) 500 MG 24 hr tablet Take 1,500 mg by mouth every evening.    . traMADol (ULTRAM) 50 MG tablet Take 1 tablet (50 mg total) by mouth every 8 (eight) hours as needed for pain. 60 tablet 1   Facility-Administered Medications Prior to Visit  Medication Dose Route Frequency Provider Last Rate Last Dose  . cyanocobalamin ((VITAMIN B-12)) injection 1,000 mcg  1,000 mcg Intramuscular Q30 days Lafayette Dragon, MD   1,000 mcg at 02/04/13 3536    PAST MEDICAL HISTORY: Past Medical History  Diagnosis Date  . Iron deficiency anemia   . Tubular adenoma of colon 2012  . Gastropathy 2012    reactive  . PUD (peptic ulcer disease)   . Prostate cancer     a. 09/2008 s/p prostatectomy.  . CAD (coronary artery disease)     a. reported h/o MI in the 41's;  b. 04/2000 Cath: LM nl, LAD 40p, D1 small, nl, RI nl, LCX nl, RCA nl.  . Hyperlipidemia   . Vertebral artery stenosis     a. 09/2010 s/p L vertebral stenting 09/2010.  Marland Kitchen GERD (gastroesophageal reflux disease)   . Hiatal hernia   . Recurrent spontaneous pneumothorax     a. s/p L lobectomy in 1966.  Marland Kitchen  DJD (degenerative joint disease)   . Hypertension   . Upper GI bleed     a. 2012  . Fatty liver   . Hypertension   . Syncope     a. in setting of GIB in 2012, presumed to be orthostatic.  . Suicide attempt     a. 08/2013 attempt by hanging with subsequent resp failure  . Myocardial infarction     " BACK IN THE 90'S"  . Dementia   . Shortness of breath     PAST SURGICAL HISTORY: Past Surgical History  Procedure Laterality Date  . Prostatectomy    . Vertebral artery stent    . Lung removal,  partial  1960s    left  . Cataract extraction Right   . Cardiac surgery    . Cardiac catheterization  09/15/2013  . Left heart catheterization with coronary angiogram N/A 09/15/2013    Procedure: LEFT HEART CATHETERIZATION WITH CORONARY ANGIOGRAM;  Surgeon: Burnell Blanks, MD;  [facility] C[facility];  Service: Cardiovascular;  Laterality: N/A;    FAMILY HISTORY: Family History  Problem Relation Age of Onset  . Heart attack Father   . Alzheimer's disease Mother   . Prostate cancer Brother     SOCIAL HISTORY:  History   Social History  . Marital Status: Married    Spouse Name: Gracie    Number of Children: 0  . Years of Education: 12th   Occupational History  . retired    Social History Main Topics  . Smoking status: Former Smoker -- 1.00 packs/day for 40 years    Types: Cigarettes    Quit date: 08/27/2002  . Smokeless tobacco: Never Used  . Alcohol Use: No     Comment: Hx heavy EtOH use but quit 2011  . Drug Use: No  . Sexual Activity: Not on file   Other Topics Concern  . Not on file   Social History Narrative   Lives in [place] with wife.  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   Caffeine Use: 4 cups daily         PHYSICAL EXAM  Filed Vitals:   12/21/14 1031  BP: 114/64  Pulse: 57  Temp: 97.6 F (36.4 C)  TempSrc: Oral  Height: 6' (1.829 m)  Weight: 167 lb 3.2 oz (75.841 kg)    Body mass index is 22.67 kg/(m^2).   Visual Acuity Screening   Right eye Left eye Both eyes  Without correction:     With correction: 20/30 20/200     MMSE - Mini Mental State Exam 12/21/2014 07/15/2013  Orientation to time 5 5  Orientation to Place 5 5  Registration 3 3  Attention/ Calculation 1 5  Recall 2 3  Language- name 2 objects 2 2  Language- repeat 1 1  Language- follow 3 step command 3 3  Language- read & follow direction 1 1  Write a sentence 1 1  Copy design 1 1  Total score 25 30    GENERAL EXAM: Patient is in no distress; well  developed, nourished and groomed; neck is supple  CARDIOVASCULAR: Regular rate and rhythm, no murmurs, no carotid bruits  NEUROLOGIC: MENTAL STATUS: awake, alert, oriented to person, place and time, recent and remote memory intact, normal attention and concentration, language fluent, comprehension intact, naming intact, fund of knowledge appropriate CRANIAL NERVE: no papilledema on fundoscopic exam, pupils equal and reactive to light, visual fields full to confrontation, extraocular muscles intact, no nystagmus, facial sensation and strength symmetric,  hearing intact, palate elevates symmetrically, uvula midline, shoulder shrug symmetric, tongue midline. MOTOR: normal bulk and tone, full strength in the LUE, LLE; RUE 3-4, RLE 3-4.  SENSORY: normal and symmetric to light touch; DECR VIB AT TOES COORDINATION: finger-nose-finger, fine finger movements normal REFLEXES: BUE 2, RIGHT KNEE 1, LEFT KNEE 3, ANKLES 1 GAIT/STATION: narrow based gait; LIMPING IN RLE    DIAGNOSTIC DATA (LABS, IMAGING, TESTING) - I reviewed patient records, labs, notes, testing and imaging myself where available.  Lab Results  Component Value Date   WBC 4.4 09/29/2013   HGB 13.3 09/29/2013   HCT 39.0 09/29/2013   MCV 84.5 09/29/2013   PLT 149* 09/29/2013      Component Value Date/Time   NA 140 09/29/2013 0813   K 4.2 09/29/2013 0813   CL 105 09/29/2013 0813   CO2 27 09/16/2013 0550   GLUCOSE 88 09/29/2013 0813   BUN 12 09/29/2013 0813   CREATININE 1.20 09/29/2013 0813   CALCIUM 8.5 09/16/2013 0550   PROT 6.5 09/15/2013 0710   ALBUMIN 3.1* 09/15/2013 0710   AST 23 09/15/2013 0710   ALT 16 09/15/2013 0710   ALKPHOS 65 09/15/2013 0710   BILITOT 0.2* 09/15/2013 0710   GFRNONAA 83* 09/16/2013 0550   GFRAA >90 09/16/2013 0550   Lab Results  Component Value Date   CHOL 125 07/08/2011   HDL 60 07/08/2011   LDLCALC 51 07/08/2011   TRIG 70 07/08/2011   CHOLHDL 2.1 07/08/2011   No results found for:  HGBA1C Lab Results  Component Value Date   VITAMINB12 279 07/15/2013   Lab Results  Component Value Date   TSH 1.44 12/30/2012    I reviewed images myself and agree with interpretation. -VRP  09/29/13 CT HEAD - Stable unremarkable head CT. No acute intracranial findings.  09/29/13 CT CERVICAL SPINE -  No acute cervical spine findings. Mild spondylosis as described.  07/20/13 MRI brain - normal for age    ASSESSMENT AND PLAN  74 y.o. year old male here with remote alcohol abuse, multiple syncopal event status post left vertebral artery stent, now with progressive memory loss, auditory hallucinations and suicide attempt in 2014.  Ddx: neurodegenerative dementia, alcoholic dementia, hypoxic and anoxic injury, primary psychiatric disorder  PLAN:  Orders Placed This Encounter  Procedures  . MR Brain Wo Contrast  . Vitamin B12  . Lipid Panel  . Hemoglobin A1c   Return in about 3 months (around 03/22/2015).    Penni Bombard, MD 1/0/2725, 36:64 AM Certified in Neurology, Neurophysiology and Neuroimaging  [facility] [address], [place], [place] [address] [phone]

## 2014-12-22 ENCOUNTER — Other Ambulatory Visit (INDEPENDENT_AMBULATORY_CARE_PROVIDER_SITE_OTHER): Payer: Self-pay

## 2014-12-22 DIAGNOSIS — Z0289 Encounter for other administrative examinations: Secondary | ICD-10-CM

## 2014-12-23 LAB — LIPID PANEL
Chol/HDL Ratio: 2.2 ratio units (ref 0.0–5.0)
Cholesterol, Total: 164 mg/dL (ref 100–199)
HDL: 76 mg/dL (ref >39)
LDL Calculated: 75 mg/dL (ref 0–99)
Triglycerides: 63 mg/dL (ref 0–149)
VLDL Cholesterol Cal: 13 mg/dL (ref 5–40)

## 2014-12-23 LAB — VITAMIN B12: Vitamin B-12: 416 pg/mL (ref 211–946)

## 2014-12-23 LAB — HEMOGLOBIN A1C
Est. average glucose Bld gHb Est-mCnc: 117 mg/dL
Hgb A1c MFr Bld: 5.7 % — ABNORMAL HIGH (ref 4.8–5.6)

## 2014-12-31 ENCOUNTER — Ambulatory Visit
Admission: RE | Admit: 2014-12-31 | Discharge: 2014-12-31 | Disposition: A | Discharge status: Home / Self Care | Payer: Medicare Other | Source: Ambulatory Visit | Attending: Diagnostic Neuroimaging | Admitting: Diagnostic Neuroimaging

## 2014-12-31 DIAGNOSIS — G8191 Hemiplegia, unspecified affecting right dominant side: Secondary | ICD-10-CM

## 2014-12-31 DIAGNOSIS — R413 Other amnesia: Secondary | ICD-10-CM

## 2014-12-31 DIAGNOSIS — G8190 Hemiplegia, unspecified affecting unspecified side: Secondary | ICD-10-CM

## 2015-01-11 ENCOUNTER — Telehealth: Payer: Self-pay | Admitting: Diagnostic Neuroimaging

## 2015-01-11 NOTE — Telephone Encounter (Signed)
Patient requesting MRI results.  Please call and advise. °

## 2015-01-12 ENCOUNTER — Telehealth: Payer: Self-pay | Admitting: *Deleted

## 2015-01-12 NOTE — Telephone Encounter (Signed)
Spoke with Dr. Leta Baptist about the pts request for MRI results. Dr. Leta Baptist agreeable for me to call pt back and let him know. Pt told that MRI was WNL. Pt and his wife stated and understanding of these results. Pt asked if we would send the results to his psychiatrist. I told the pt that if his psychiatrist submitted a request for records, then we would be happy to send the MRI results to him. Pt stated understanding and thanked Dr. Leta Baptist for his quick call back.

## 2015-02-07 ENCOUNTER — Telehealth: Payer: Self-pay | Admitting: Diagnostic Neuroimaging

## 2015-02-07 NOTE — Telephone Encounter (Signed)
Pt's wife is calling for the second time requesting MRI results she states to please return the call if no answer you may leave a message.  She would like for you to talk with her due to the pt has memory issues.  Please call and advise.

## 2015-02-07 NOTE — Telephone Encounter (Signed)
Normal scan. -VRP

## 2015-02-08 ENCOUNTER — Telehealth: Payer: Self-pay | Admitting: *Deleted

## 2015-02-08 NOTE — Telephone Encounter (Signed)
Spoke to the wife on the phone and informed her that the MRI was normal per Dr. Gladstone Lighter request. She stated an understanding and asked me if the "stents they placed during surgery" could been seen. I told her I would ask Dr. Leta Baptist and get back with her.

## 2015-02-08 NOTE — Telephone Encounter (Signed)
Called pt back and explained that where the stent was and that Dr. Leta Baptist does not think that it is causing his issues. The wife thanked me for taking the time to call her back and she said they were going to the psychiatrist tomorrow.

## 2015-02-08 NOTE — Telephone Encounter (Signed)
The stent was in the left vertebral artery, at the base of the neck, not in the head. This cannot be seen in the recent MRI brain. I do not think the stent is causing any of his current problems. I can discuss further at next appointment. -VRP

## 2015-04-22 ENCOUNTER — Emergency Department (HOSPITAL_COMMUNITY)
Admission: EM | Admit: 2015-04-22 | Discharge: 2015-04-23 | Disposition: A | Discharge status: Home / Self Care | Payer: Medicare Other | Attending: Emergency Medicine | Admitting: Emergency Medicine

## 2015-04-22 ENCOUNTER — Encounter (HOSPITAL_COMMUNITY): Payer: Self-pay | Admitting: Emergency Medicine

## 2015-04-22 DIAGNOSIS — Z7982 Long term (current) use of aspirin: Secondary | ICD-10-CM | POA: Diagnosis not present

## 2015-04-22 DIAGNOSIS — Z9889 Other specified postprocedural states: Secondary | ICD-10-CM | POA: Insufficient documentation

## 2015-04-22 DIAGNOSIS — Z8719 Personal history of other diseases of the digestive system: Secondary | ICD-10-CM | POA: Diagnosis not present

## 2015-04-22 DIAGNOSIS — R05 Cough: Secondary | ICD-10-CM | POA: Insufficient documentation

## 2015-04-22 DIAGNOSIS — R4689 Other symptoms and signs involving appearance and behavior: Secondary | ICD-10-CM

## 2015-04-22 DIAGNOSIS — Z86018 Personal history of other benign neoplasm: Secondary | ICD-10-CM | POA: Diagnosis not present

## 2015-04-22 DIAGNOSIS — F32A Depression, unspecified: Secondary | ICD-10-CM

## 2015-04-22 DIAGNOSIS — Z8709 Personal history of other diseases of the respiratory system: Secondary | ICD-10-CM | POA: Diagnosis not present

## 2015-04-22 DIAGNOSIS — Z8546 Personal history of malignant neoplasm of prostate: Secondary | ICD-10-CM | POA: Diagnosis not present

## 2015-04-22 DIAGNOSIS — Z8711 Personal history of peptic ulcer disease: Secondary | ICD-10-CM | POA: Diagnosis not present

## 2015-04-22 DIAGNOSIS — F0390 Unspecified dementia without behavioral disturbance: Secondary | ICD-10-CM | POA: Diagnosis not present

## 2015-04-22 DIAGNOSIS — Z862 Personal history of diseases of the blood and blood-forming organs and certain disorders involving the immune mechanism: Secondary | ICD-10-CM | POA: Insufficient documentation

## 2015-04-22 DIAGNOSIS — M1990 Unspecified osteoarthritis, unspecified site: Secondary | ICD-10-CM | POA: Diagnosis not present

## 2015-04-22 DIAGNOSIS — Z008 Encounter for other general examination: Secondary | ICD-10-CM | POA: Diagnosis present

## 2015-04-22 DIAGNOSIS — I10 Essential (primary) hypertension: Secondary | ICD-10-CM | POA: Diagnosis not present

## 2015-04-22 DIAGNOSIS — Z8639 Personal history of other endocrine, nutritional and metabolic disease: Secondary | ICD-10-CM | POA: Diagnosis not present

## 2015-04-22 DIAGNOSIS — F4325 Adjustment disorder with mixed disturbance of emotions and conduct: Secondary | ICD-10-CM | POA: Diagnosis not present

## 2015-04-22 DIAGNOSIS — Z915 Personal history of self-harm: Secondary | ICD-10-CM | POA: Insufficient documentation

## 2015-04-22 DIAGNOSIS — Z79899 Other long term (current) drug therapy: Secondary | ICD-10-CM | POA: Insufficient documentation

## 2015-04-22 DIAGNOSIS — I252 Old myocardial infarction: Secondary | ICD-10-CM | POA: Insufficient documentation

## 2015-04-22 DIAGNOSIS — F919 Conduct disorder, unspecified: Secondary | ICD-10-CM | POA: Diagnosis not present

## 2015-04-22 DIAGNOSIS — F4329 Adjustment disorder with other symptoms: Secondary | ICD-10-CM | POA: Diagnosis present

## 2015-04-22 DIAGNOSIS — F329 Major depressive disorder, single episode, unspecified: Secondary | ICD-10-CM | POA: Insufficient documentation

## 2015-04-22 DIAGNOSIS — Z87891 Personal history of nicotine dependence: Secondary | ICD-10-CM | POA: Diagnosis not present

## 2015-04-22 DIAGNOSIS — I2510 Atherosclerotic heart disease of native coronary artery without angina pectoris: Secondary | ICD-10-CM | POA: Diagnosis not present

## 2015-04-22 LAB — CBC
HCT: 43.3 % (ref 39.0–52.0)
Hemoglobin: 14.1 g/dL (ref 13.0–17.0)
MCH: 27.6 pg (ref 26.0–34.0)
MCHC: 32.6 g/dL (ref 30.0–36.0)
MCV: 84.9 fL (ref 78.0–100.0)
Platelets: 215 10*3/uL (ref 150–400)
RBC: 5.1 MIL/uL (ref 4.22–5.81)
RDW: 15.1 % (ref 11.5–15.5)
WBC: 6.1 10*3/uL (ref 4.0–10.5)

## 2015-04-22 LAB — RAPID URINE DRUG SCREEN, HOSP PERFORMED
Amphetamines: NOT DETECTED
Barbiturates: NOT DETECTED
Benzodiazepines: NOT DETECTED
Cocaine: NOT DETECTED
Opiates: NOT DETECTED
Tetrahydrocannabinol: NOT DETECTED

## 2015-04-22 LAB — COMPREHENSIVE METABOLIC PANEL
ALT: 14 U/L — ABNORMAL LOW (ref 17–63)
AST: 22 U/L (ref 15–41)
Albumin: 4.2 g/dL (ref 3.5–5.0)
Alkaline Phosphatase: 81 U/L (ref 38–126)
Anion gap: 5 (ref 5–15)
BUN: 16 mg/dL (ref 6–20)
CO2: 27 mmol/L (ref 22–32)
Calcium: 9.1 mg/dL (ref 8.9–10.3)
Chloride: 106 mmol/L (ref 101–111)
Creatinine, Ser: 1.11 mg/dL (ref 0.61–1.24)
GFR calc Af Amer: >60 mL/min (ref >60)
GFR calc non Af Amer: >60 mL/min (ref >60)
Glucose, Bld: 94 mg/dL (ref 70–99)
Potassium: 3.9 mmol/L (ref 3.5–5.1)
Sodium: 138 mmol/L (ref 135–145)
Total Bilirubin: 0.5 mg/dL (ref 0.3–1.2)
Total Protein: 7.7 g/dL (ref 6.5–8.1)

## 2015-04-22 LAB — ETHANOL: Alcohol, Ethyl (B): <5 mg/dL (ref <5)

## 2015-04-22 LAB — SALICYLATE LEVEL: Salicylate Lvl: <4 mg/dL (ref 2.8–30.0)

## 2015-04-22 LAB — ACETAMINOPHEN LEVEL: Acetaminophen (Tylenol), Serum: <10 ug/mL — ABNORMAL LOW (ref 10–30)

## 2015-04-22 NOTE — ED Provider Notes (Signed)
CSN: [id number]     Arrival date & time 04/22/15  1929 History  This chart was scribed for Clemens Catholic, NP working with Debby Freiberg, MD by Mercy Moore, ED Scribe. This patient was seen in room WTR3/WLPT3 and the patient's care was started at 11:14 PM.   Chief Complaint  Patient presents with  . Medical Clearance   The history is provided by the patient. No language interpreter was used.   HPI Comments: Anthony Skinner is a 74 y.o. male who presents to the Emergency Department, brought in by his wife who reports recent irritability and tantrums. Patient does not endorse his wife's recount. Patient reports that his wife states that he threw a pill bottle this evening. Patient states that after the event, his wife began screaming and yelling and so did he. Patient states that his day has gone pretty smoothly, though he was quiet because he hadn't gotten much sleep last night. Patient states that he suspects he just dropped the bottle; he did not intentionally throw the bottle.  Patient shares suicide attempt one year ago: hanging. Patient denies suicidal ideation since. Patient being treated by a psychiatrist. Patient reports history of audio hallucinations prior to his attempt "telling him how sorry of a peron he was". Patient states that since beginning treatment he has not experienced hallucinations. Patient does not drink, smoke or participate in recreational drug use.   Past Medical History  Diagnosis Date  . Iron deficiency anemia   . Tubular adenoma of colon 2012  . Gastropathy 2012    reactive  . PUD (peptic ulcer disease)   . Prostate cancer     a. 09/2008 s/p prostatectomy.  . CAD (coronary artery disease)     a. reported h/o MI in the 70's;  b. 04/2000 Cath: LM nl, LAD 40p, D1 small, nl, RI nl, LCX nl, RCA nl.  . Hyperlipidemia   . Vertebral artery stenosis     a. 09/2010 s/p L vertebral stenting 09/2010.  Marland Kitchen GERD (gastroesophageal reflux disease)   . Hiatal hernia   .  Recurrent spontaneous pneumothorax     a. s/p L lobectomy in 1966.  Marland Kitchen DJD (degenerative joint disease)   . Hypertension   . Upper GI bleed     a. 2012  . Fatty liver   . Hypertension   . Syncope     a. in setting of GIB in 2012, presumed to be orthostatic.  . Suicide attempt     a. 08/2013 attempt by hanging with subsequent resp failure  . Myocardial infarction     " BACK IN THE 90'S"  . Dementia   . Shortness of breath    Past Surgical History  Procedure Laterality Date  . Prostatectomy    . Vertebral artery stent    . Lung removal, partial  1960s    left  . Cataract extraction Right   . Cardiac surgery    . Cardiac catheterization  09/15/2013  . Left heart catheterization with coronary angiogram N/A 09/15/2013    Procedure: LEFT HEART CATHETERIZATION WITH CORONARY ANGIOGRAM;  Surgeon: Burnell Blanks, MD;  Location: [facility] CATH LAB;  Service: Cardiovascular;  Laterality: N/A;   Family History  Problem Relation Age of Onset  . Heart attack Father   . Alzheimer's disease Mother   . Prostate cancer Brother    History  Substance Use Topics  . Smoking status: Former Smoker -- 1.00 packs/day for 40 years    Types: Cigarettes  Quit date: 08/27/2002  . Smokeless tobacco: Never Used  . Alcohol Use: No     Comment: Hx heavy EtOH use but quit 2011    Review of Systems  Constitutional: Negative for fever and chills.  HENT: Negative for congestion and sore throat.   Eyes: Negative for pain.  Respiratory: Positive for cough. Negative for shortness of breath and wheezing.   Cardiovascular: Negative for chest pain.  Gastrointestinal: Negative for nausea, vomiting, abdominal pain and diarrhea.  Genitourinary: Negative for dysuria.  Musculoskeletal: Negative for neck stiffness.  Skin: Negative for rash.  Allergic/Immunologic: Negative for immunocompromised state.  Neurological: Negative for headaches.  Hematological: Negative for adenopathy.  Psychiatric/Behavioral:  Negative for suicidal ideas, hallucinations and behavioral problems.      Allergies  Review of patient's allergies indicates no known allergies.  Home Medications   Prior to Admission medications   Medication Sig Start Date End Date Taking? Authorizing Provider  aspirin 81 MG tablet Take 81 mg by mouth daily.   Yes Historical Provider, MD  clonazePAM (KLONOPIN) 0.5 MG tablet Take 1 tablet by mouth daily. 12/06/14  Yes Historical Provider, MD  risperiDONE (RISPERDAL) 0.5 MG tablet Take 2 tablets (1 mg total) by mouth at bedtime. 09/29/13  Yes Noland Fordyce, PA-C  sertraline (ZOLOFT) 50 MG tablet Take 50 mg by mouth 3 (three) times daily.  11/26/14  Yes Historical Provider, MD   Triage Vitals: BP 134/65 mmHg  Pulse 69  Temp(Src) 98.8 F (37.1 C) (Oral)  Resp 16  SpO2 97% Physical Exam  Constitutional: He is oriented to person, place, and time. He appears well-developed and well-nourished. No distress.  HENT:  Head: Normocephalic and atraumatic.  Eyes: EOM are normal.  Neck: Neck supple. No tracheal deviation present.  Cardiovascular: Normal rate.   Pulmonary/Chest: Effort normal. No respiratory distress.  Musculoskeletal: Normal range of motion.  Neurological: He is alert and oriented to person, place, and time.  Skin: Skin is warm and dry.  Psychiatric: He has a normal mood and affect. His behavior is normal.  Nursing note and vitals reviewed.   ED Course  Procedures (including critical care time)  COORDINATION OF CARE: 11:10 PM- Discussed treatment plan with patient at bedside and patient agreed to plan.   Labs Review Labs Reviewed  ACETAMINOPHEN LEVEL - Abnormal; Notable for the following:    Acetaminophen (Tylenol), Serum <10 (*)    All other components within normal limits  COMPREHENSIVE METABOLIC PANEL - Abnormal; Notable for the following:    ALT 14 (*)    All other components within normal limits  CBC  ETHANOL  SALICYLATE LEVEL  URINE RAPID DRUG SCREEN  (HOSP PERFORMED)    Imaging Review No results found.   EKG Interpretation None      MDM   Final diagnoses:  Aggressive behavior  Depression  Adjustment disorder with disturbance of emotion   Patient has been medically cleared in the ED and is awaiting consult by TTS team for possible placement vs OP clinic information. Pt is currently not having SI or HI and appears stable in NAD. Pt is cleared to be moved back to [facility].    I personally performed the services described in this documentation, which was scribed in my presence. The recorded information has been reviewed and is accurate.   04/23/15 0030 04/23/15 0606  BP: 143/62 119/55  Pulse: 64 54  Temp:  98.4 F (36.9 C)  TempSrc:  Oral  Resp: 16 18  SpO2: 98% 99%  Meds given in ED:  Medications - No data to display  Discharge Medication List as of 04/23/2015  2:40 PM       Britt Bottom, NP 04/24/15 [place], MD 04/26/15 [phone]

## 2015-04-22 NOTE — ED Notes (Signed)
Pt from home brought in by wife. Pt has been getting easily upset and she reports that he is not being himself. She reports patient attempted suicide 1 year ago. She reports today all of a sudden he through a bottle of meds . Pt states that he is unsure if he wants to hurt self or others. Wife reports that  She does not feel safe but does not believe he would hurt her. She reports he has stayed at [facility] before.

## 2015-04-22 NOTE — ED Notes (Signed)
Pt wife Jovi Zavadil home # is [phone]  cell [phone]

## 2015-04-23 DIAGNOSIS — R4689 Other symptoms and signs involving appearance and behavior: Secondary | ICD-10-CM | POA: Insufficient documentation

## 2015-04-23 DIAGNOSIS — F4329 Adjustment disorder with other symptoms: Secondary | ICD-10-CM

## 2015-04-23 MED ORDER — SODIUM CHLORIDE 0.9 % IV BOLUS (SEPSIS): 1000.0000 mL | INTRAVENOUS | Status: DC

## 2015-04-23 MED ORDER — ONDANSETRON HCL 4 MG/2ML IJ SOLN: 4.0000 mg | INTRAMUSCULAR | Status: DC

## 2015-04-23 NOTE — Progress Notes (Signed)
2:15pm. CSW called pt's wife, Terri Piedra (number on chart) for collateral. Wife states that patient has been acting "not himself" the past four days, that he has been starring into space a lot, and that his temper has been shorter. She reports that his medicines were changed by his psychiatrist Dr. Jeanine Luz. Since then, he's been reluctant to take medicine in the evenings. Last night he took his medicines and slammed them on the floor. This worried wife, and so she brought pt into ED for further evaluation. Wife states that she is comfortable with psychiatrist's recommendation for d/c with follow up with personal psychiatrist. Wife is en route to hospital now.   [place] Worker [facility] phone: [phone]

## 2015-04-23 NOTE — ED Notes (Signed)
Pt's wife in room

## 2015-04-23 NOTE — ED Notes (Signed)
Meal tray given to pt.

## 2015-04-23 NOTE — BH Assessment (Signed)
Assessment completed. Consulted Darlyne Russian, PA-C who recommended a geri-psych consult. Clemens Catholic, NP has been informed of the recommendation.

## 2015-04-23 NOTE — ED Notes (Signed)
Report given to [facility]

## 2015-04-23 NOTE — [facility] Suicide Risk Assessment (Signed)
Suicide Risk Assessment  Discharge Assessment   [facility] Discharge Suicide Risk Assessment   Demographic Factors:  Male, Age 72 or older and Caucasian  Total Time spent with patient: 45 minutes  Musculoskeletal: Strength & Muscle Tone: within normal limits Gait & Station: normal Patient leans: N/A  Psychiatric Specialty Exam:     Blood pressure 129/51, pulse 68, temperature 98.3 F (36.8 C), temperature source Oral, resp. rate 20, SpO2 98 %.There is no weight on file to calculate BMI.  General Appearance: Casual  Eye Contact::  Good  Speech:  Normal Rate  Volume:  Normal  Mood:  Depressed, mild  Affect:  Congruent  Thought Process:  Coherent  Orientation:  Full (Time, Place, and Person)  Thought Content:  WDL  Suicidal Thoughts:  No  Homicidal Thoughts:  No  Memory:  Immediate;   Good Recent;   Good Remote;   Good  Judgement:  Fair  Insight:  Fair  Psychomotor Activity:  Normal  Concentration:  Good  Recall:  Good  Fund of Knowledge:Good  Language: Good  Akathisia:  No  Handed:  Right  AIMS (if indicated):     Assets:  Catering manager Housing Leisure Time Resilience Social Support  ADL's:  Intact  Cognition: WNL  Sleep:      Has this patient used any form of tobacco in the last 30 days? (Cigarettes, Smokeless Tobacco, Cigars, and/or Pipes) No  Mental Status Per Nursing Assessment::   On Admission:   Emotional upset  Current Mental Status by Physician: NA  Loss Factors: NA  Historical Factors: NA  Risk Reduction Factors:   Sense of responsibility to family, Living with another person, especially a relative, Positive social support, Positive therapeutic relationship and Positive coping skills or problem solving skills  Continued Clinical Symptoms:  Mild depression  Cognitive Features That Contribute To Risk:  None    Suicide Risk:  Minimal: No identifiable suicidal ideation.  Patients presenting with no risk factors but with morbid  ruminations; may be classified as minimal risk based on the severity of the depressive symptoms  Principal Problem: Adjustment disorder with disturbance of emotion Discharge Diagnoses:  Patient Active Problem List   Diagnosis Date Noted  . Adjustment disorder with disturbance of emotion [F43.29] 04/23/2015    Priority: High  . Aggressive behavior [F60.89]   . Chest pain [R07.9] 09/15/2013  . Coronary atherosclerosis of native coronary artery [I25.10] 09/15/2013  . Hypokalemia [E87.6] 08/28/2013  . Acute respiratory failure [J96.00] 08/25/2013  . Altered mental status [R41.82] 08/25/2013  . Anoxic brain injury [G93.1] 08/25/2013  . HTN (hypertension) [I10] 08/25/2013  . Suicide attempt [T14.91] 08/25/2013  . Vitamin B 12 deficiency [E53.8] 07/15/2013  . Unspecified hereditary and idiopathic peripheral neuropathy [G60.9] 07/15/2013  . Memory loss [R41.3] 07/15/2013      Plan Of Care/Follow-up recommendations:  Activity:  as tolerated Diet:  heart healthy diet  Is patient on multiple antipsychotic therapies at discharge:  No   Has Patient had three or more failed trials of antipsychotic monotherapy by history:  No  Recommended Plan for Multiple Antipsychotic Therapies: NA    Samir Ishaq, [place], PMH-NP 04/23/2015, 2:37 PM

## 2015-04-23 NOTE — Consult Note (Signed)
[facility] Consult   Reason for Consult:  Emotional upset Referring Physician:  EDP Patient Identification: Anthony Skinner MRN:  [id number] Principal Diagnosis: Adjustment disorder with disturbance of emotion Diagnosis:   Patient Active Problem List   Diagnosis Date Noted  . Adjustment disorder with disturbance of emotion [F43.29] 04/23/2015    Priority: High  . Chest pain [R07.9] 09/15/2013  . Coronary atherosclerosis of native coronary artery [I25.10] 09/15/2013  . Hypokalemia [E87.6] 08/28/2013  . Acute respiratory failure [J96.00] 08/25/2013  . Altered mental status [R41.82] 08/25/2013  . Anoxic brain injury [G93.1] 08/25/2013  . HTN (hypertension) [I10] 08/25/2013  . Suicide attempt [T14.91] 08/25/2013  . Vitamin B 12 deficiency [E53.8] 07/15/2013  . Unspecified hereditary and idiopathic peripheral neuropathy [G60.9] 07/15/2013  . Memory loss [R41.3] 07/15/2013    Total Time spent with patient: 45 minutes  Subjective:   Anthony Skinner is a 74 y.o. male patient does not warrant admission.  HPI:  The patient was sent to the ED by his wife for being easily upset recently.  The patient is clear, coherent and denies suicidal ideations.  He does recall trying to hang himself about a year ago but does not feel that way now.  Anthony Skinner said he has been isolating at home since his prostate cancer four years ago when he had his "manhood" taken along with his driver's license and home respirabilities.  He reports feeling frustrated over having these things taken from him.  Denies suicidal/homicidal ideations and alcohol/drug issues.  His last use of alcohol was six years ago and 17 years ago for cigarette smoking.  The last two weeks he does say he has seen people other people do not but not currently seeing people.  He has an appointment with his regular psychiatrist on Wednesday.  His wife reports medication changes two weeks ago but does not know what changes nor does he. HPI  Elements:   Location:  generalized. Quality:  acute. Severity:  mild. Timing:  intermittent. Duration:  brief. Context:  stressors.  Past Medical History:  Past Medical History  Diagnosis Date  . Iron deficiency anemia   . Tubular adenoma of colon 2012  . Gastropathy 2012    reactive  . PUD (peptic ulcer disease)   . Prostate cancer     a. 09/2008 s/p prostatectomy.  . CAD (coronary artery disease)     a. reported h/o MI in the 24's;  b. 04/2000 Cath: LM nl, LAD 40p, D1 small, nl, RI nl, LCX nl, RCA nl.  . Hyperlipidemia   . Vertebral artery stenosis     a. 09/2010 s/p L vertebral stenting 09/2010.  Marland Kitchen GERD (gastroesophageal reflux disease)   . Hiatal hernia   . Recurrent spontaneous pneumothorax     a. s/p L lobectomy in 1966.  Marland Kitchen DJD (degenerative joint disease)   . Hypertension   . Upper GI bleed     a. 2012  . Fatty liver   . Hypertension   . Syncope     a. in setting of GIB in 2012, presumed to be orthostatic.  . Suicide attempt     a. 08/2013 attempt by hanging with subsequent resp failure  . Myocardial infarction     " BACK IN THE 90'S"  . Dementia   . Shortness of breath     Past Surgical History  Procedure Laterality Date  . Prostatectomy    . Vertebral artery stent    . Lung removal, partial  1960s  left  . Cataract extraction Right   . Cardiac surgery    . Cardiac catheterization  09/15/2013  . Left heart catheterization with coronary angiogram N/A 09/15/2013    Procedure: LEFT HEART CATHETERIZATION WITH CORONARY ANGIOGRAM;  Surgeon: Burnell Blanks, MD;  Location: [facility] CATH LAB;  Service: Cardiovascular;  Laterality: N/A;   Family History:  Family History  Problem Relation Age of Onset  . Heart attack Father   . Alzheimer's disease Mother   . Prostate cancer Brother    Social History:  History  Alcohol Use No    Comment: Hx heavy EtOH use but quit 2011     History  Drug Use No    History   Social History  . Marital Status: Married     Spouse Name: Terri Piedra  . Number of Children: 0  . Years of Education: 12th   Occupational History  . retired    Social History Main Topics  . Smoking status: Former Smoker -- 1.00 packs/day for 40 years    Types: Cigarettes    Quit date: 08/27/2002  . Smokeless tobacco: Never Used  . Alcohol Use: No     Comment: Hx heavy EtOH use but quit 2011  . Drug Use: No  . Sexual Activity: Not on file   Other Topics Concern  . None   Social History Narrative   Lives in [place] with wife.  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   Caffeine Use: 4 cups daily       Additional Social History:    History of alcohol / drug use?: No history of alcohol / drug abuse                     Allergies:  No Known Allergies  Labs:  Results for orders placed or performed during the hospital encounter of 04/22/15 (from the past 48 hour(s))  Acetaminophen level     Status: Abnormal   Collection Time: 04/22/15  8:33 PM  Result Value Ref Range   Acetaminophen (Tylenol), Serum <10 (L) 10 - 30 ug/mL    Comment:        THERAPEUTIC CONCENTRATIONS VARY SIGNIFICANTLY. A RANGE OF 10-30 ug/mL MAY BE AN EFFECTIVE CONCENTRATION FOR MANY PATIENTS. HOWEVER, SOME ARE BEST TREATED AT CONCENTRATIONS OUTSIDE THIS RANGE. ACETAMINOPHEN CONCENTRATIONS >150 ug/mL AT 4 HOURS AFTER INGESTION AND >50 ug/mL AT 12 HOURS AFTER INGESTION ARE OFTEN ASSOCIATED WITH TOXIC REACTIONS.   CBC     Status: None   Collection Time: 04/22/15  8:33 PM  Result Value Ref Range   WBC 6.1 4.0 - 10.5 K/uL   RBC 5.10 4.22 - 5.81 MIL/uL   Hemoglobin 14.1 13.0 - 17.0 g/dL   HCT 43.3 39.0 - 52.0 %   MCV 84.9 78.0 - 100.0 fL   MCH 27.6 26.0 - 34.0 pg   MCHC 32.6 30.0 - 36.0 g/dL   RDW 15.1 11.5 - 15.5 %   Platelets 215 150 - 400 K/uL  Comprehensive metabolic panel     Status: Abnormal   Collection Time: 04/22/15  8:33 PM  Result Value Ref Range   Sodium 138 135 - 145 mmol/L   Potassium 3.9 3.5 - 5.1 mmol/L    Chloride 106 101 - 111 mmol/L   CO2 27 22 - 32 mmol/L   Glucose, Bld 94 70 - 99 mg/dL   BUN 16 6 - 20 mg/dL   Creatinine, Ser 1.11 0.61 - 1.24 mg/dL   Calcium 9.1 8.9 -  10.3 mg/dL   Total Protein 7.7 6.5 - 8.1 g/dL   Albumin 4.2 3.5 - 5.0 g/dL   AST 22 15 - 41 U/L   ALT 14 (L) 17 - 63 U/L   Alkaline Phosphatase 81 38 - 126 U/L   Total Bilirubin 0.5 0.3 - 1.2 mg/dL   GFR calc non Af Amer >60 >60 mL/min   GFR calc Af Amer >60 >60 mL/min    Comment: (NOTE) The eGFR has been calculated using the CKD EPI equation. This calculation has not been validated in all clinical situations. eGFR's persistently <60 mL/min signify possible Chronic Kidney Disease.    Anion gap 5 5 - 15  Ethanol (ETOH)     Status: None   Collection Time: 04/22/15  8:33 PM  Result Value Ref Range   Alcohol, Ethyl (B) <5 <5 mg/dL    Comment:        LOWEST DETECTABLE LIMIT FOR SERUM ALCOHOL IS 11 mg/dL FOR MEDICAL PURPOSES ONLY   Salicylate level     Status: None   Collection Time: 04/22/15  8:33 PM  Result Value Ref Range   Salicylate Lvl <1.6 2.8 - 30.0 mg/dL  Urine Drug Screen     Status: None   Collection Time: 04/22/15  8:34 PM  Result Value Ref Range   Opiates NONE DETECTED NONE DETECTED   Cocaine NONE DETECTED NONE DETECTED   Benzodiazepines NONE DETECTED NONE DETECTED   Amphetamines NONE DETECTED NONE DETECTED   Tetrahydrocannabinol NONE DETECTED NONE DETECTED   Barbiturates NONE DETECTED NONE DETECTED    Comment:        DRUG SCREEN FOR MEDICAL PURPOSES ONLY.  IF CONFIRMATION IS NEEDED FOR ANY PURPOSE, NOTIFY LAB WITHIN 5 DAYS.        LOWEST DETECTABLE LIMITS FOR URINE DRUG SCREEN Drug Class       Cutoff (ng/mL) Amphetamine      1000 Barbiturate      200 Benzodiazepine   606 Tricyclics       301 Opiates          300 Cocaine          300 THC              50     Vitals: Blood pressure 129/51, pulse 68, temperature 98.3 F (36.8 C), temperature source Oral, resp. rate 20, SpO2 98  %.  Risk to Self: Suicidal Ideation: No Suicidal Intent: No Is patient at risk for suicide?: No Suicidal Plan?: No Access to Means: No What has been your use of drugs/alcohol within the last 12 months?: No drug or alcohol use reported at this time.  How many times?: 1 (2014-hung self ) Other Self Harm Risks: No other self harm risk identified at this time.  Triggers for Past Attempts: Hallucinations ("Auditory hallucinations telling me that I am sorry". ) Intentional Self Injurious Behavior: None Risk to Others: Homicidal Ideation: No Thoughts of Harm to Others: No Current Homicidal Intent: No Current Homicidal Plan: No Access to Homicidal Means: No Identified Victim: NA History of harm to others?: No Assessment of Violence: On admission Violent Behavior Description: No violent behaviors observed. Pt is calm and cooperative.  Does patient have access to weapons?: No Criminal Charges Pending?: No Does patient have a court date: No Prior Inpatient Therapy: Prior Inpatient Therapy: Yes Prior Therapy Dates: 2014 Prior Therapy Facilty/Provider(s): [facility] Reason for Treatment: Suicide attempt Prior Outpatient Therapy: Prior Outpatient Therapy: Yes Prior Therapy Dates: 2014-present  Prior Therapy Facilty/Provider(s):  Dr. Zeb Comfort  Reason for Treatment: Depression  Does patient have an ACCT team?: No Does patient have Intensive In-House Services?  : No Does patient have [facility] services? : No Does patient have P4CC services?: No  Current Facility-Administered Medications  Medication Dose Route Frequency Provider Last Rate Last Dose  . cyanocobalamin ((VITAMIN B-12)) injection 1,000 mcg  1,000 mcg Intramuscular Q30 days [place] Dragon, MD   1,000 mcg at 02/04/13 8144   Current Outpatient Prescriptions  Medication Sig Dispense Refill  . aspirin 81 MG tablet Take 81 mg by mouth daily.    . clonazePAM (KLONOPIN) 0.5 MG tablet Take 1 tablet by mouth daily.    . risperiDONE (RISPERDAL) 0.5  MG tablet Take 2 tablets (1 mg total) by mouth at bedtime. 30 tablet 0  . sertraline (ZOLOFT) 50 MG tablet Take 50 mg by mouth 3 (three) times daily.   3    Musculoskeletal: Strength & Muscle Tone: within normal limits Gait & Station: normal Patient leans: N/A  Psychiatric Specialty Exam:     Blood pressure 129/51, pulse 68, temperature 98.3 F (36.8 C), temperature source Oral, resp. rate 20, SpO2 98 %.There is no weight on file to calculate BMI.  General Appearance: Casual  Eye Contact::  Good  Speech:  Normal Rate  Volume:  Normal  Mood:  Depressed, mild  Affect:  Congruent  Thought Process:  Coherent  Orientation:  Full (Time, Place, and Person)  Thought Content:  WDL  Suicidal Thoughts:  No  Homicidal Thoughts:  No  Memory:  Immediate;   Good Recent;   Good Remote;   Good  Judgement:  Fair  Insight:  Fair  Psychomotor Activity:  Normal  Concentration:  Good  Recall:  Good  Fund of Knowledge:Good  Language: Good  Akathisia:  No  Handed:  Right  AIMS (if indicated):     Assets:  Catering manager Housing Leisure Time Resilience Social Support  ADL's:  Intact  Cognition: WNL  Sleep:      Medical Decision Making: Review of Psycho-Social Stressors (1), Review or order clinical lab tests (1) and Review of Medication Regimen & Side Effects (2)  Treatment Plan Summary: Daily contact with patient to assess and evaluate symptoms and progress in treatment, Medication management and Plan discharge home and follow-up with his regular psychiatrist on Wednesday  Plan:  No evidence of imminent risk to self or others at present.   Disposition: discharge home and follow-up with his regular psychiatrist on Wednesday  Waylan Boga, [place] 04/23/2015 2:25 PM Patient seen face-to-face for psychiatric evaluation, chart reviewed and case discussed with the physician extender and developed treatment plan. Reviewed the information documented and agree with the treatment  plan. Corena Pilgrim, MD

## 2015-04-23 NOTE — BH Assessment (Addendum)
Tele Assessment Note   Anthony Skinner is an 74 y.o. male presenting to [facility] requesting to speak with the psychiatrist. Pt stated "my wife said that I threw these pills and I thought that I dropped them". "I don't know what happen". "She is afraid that I might do something out of my head to hurt her". "I would never do that we have been married for 30 years". "She got upset with me and I can't remember any of it". "I would like to speak with the psychiatrist". "Do you think that one will come in on Sunday morning to speak with me?" Pt denies SI but reported that he attempted suicide almost 2 years ago by hanging himself. Pt reported that he was experiencing auditory hallucinations that were telling him "you're sorry". "I found out that there were demons inside of me but I don't hear the voices anymore". "My wife had to cut me down while the police watched". "I didn't have any brain damage". Pt reported that he is currently seeing a psychiatrist in [place]. Pt is endorsing some depressive symptoms; however he did not report any issues with his sleep or appetite. "I had prostate cancer and they took everything out and now I feel guilty because I am not a man to my wife". Pt denied HI and AVH at this time. Pt "I take a pill at night to help with the voices". Pt did not report any alcohol or illicit substance abuse. Pt did not report any physical, sexual or emotional abuse. PT reported that his wife is his legal guardian.  A psychiatric consult is recommended.   Axis I: See current hospital problem list  Past Medical History:  Past Medical History  Diagnosis Date  . Iron deficiency anemia   . Tubular adenoma of colon 2012  . Gastropathy 2012    reactive  . PUD (peptic ulcer disease)   . Prostate cancer     a. 09/2008 s/p prostatectomy.  . CAD (coronary artery disease)     a. reported h/o MI in the 74's;  b. 04/2000 Cath: LM nl, LAD 40p, D1 small, nl, RI nl, LCX nl, RCA nl.  . Hyperlipidemia   .  Vertebral artery stenosis     a. 09/2010 s/p L vertebral stenting 09/2010.  Marland Kitchen GERD (gastroesophageal reflux disease)   . Hiatal hernia   . Recurrent spontaneous pneumothorax     a. s/p L lobectomy in 1966.  Marland Kitchen DJD (degenerative joint disease)   . Hypertension   . Upper GI bleed     a. 2012  . Fatty liver   . Hypertension   . Syncope     a. in setting of GIB in 2012, presumed to be orthostatic.  . Suicide attempt     a. 08/2013 attempt by hanging with subsequent resp failure  . Myocardial infarction     " BACK IN THE 90'S"  . Dementia   . Shortness of breath     Past Surgical History  Procedure Laterality Date  . Prostatectomy    . Vertebral artery stent    . Lung removal, partial  1960s    left  . Cataract extraction Right   . Cardiac surgery    . Cardiac catheterization  09/15/2013  . Left heart catheterization with coronary angiogram N/A 09/15/2013    Procedure: LEFT HEART CATHETERIZATION WITH CORONARY ANGIOGRAM;  Surgeon: Burnell Blanks, MD;  Location: [facility] CATH LAB;  Service: Cardiovascular;  Laterality: N/A;    Family  History:  Family History  Problem Relation Age of Onset  . Heart attack Father   . Alzheimer's disease Mother   . Prostate cancer Brother     Social History:  reports that he quit smoking about 12 years ago. His smoking use included Cigarettes. He has a 40 pack-year smoking history. He has never used smokeless tobacco. He reports that he does not drink alcohol or use illicit drugs.  Additional Social History:  Alcohol / Drug Use History of alcohol / drug use?: No history of alcohol / drug abuse  CIWA: CIWA-Ar BP: 134/65 mmHg Pulse Rate: 69 COWS:    PATIENT STRENGTHS: (choose at least two) Average or above average intelligence Communication skills  Allergies: No Known Allergies  Home Medications:  (Not in a hospital admission)  OB/GYN Status:  No LMP for male patient.  General Assessment Data Location of Assessment: [facility] ED TTS  Assessment: In system Is this a Tele or Face-to-Face Assessment?: Face-to-Face Is this an Initial Assessment or a Re-assessment for this encounter?: Initial Assessment Marital status: Married Living Arrangements: Spouse/significant other Can pt return to current living arrangement?: Yes Admission Status: Voluntary Is patient capable of signing voluntary admission?: Yes Referral Source: Self/Family/Friend     Crisis Care Plan Living Arrangements: Spouse/significant other Name of Psychiatrist: Dr. Zeb Comfort  Name of Therapist: No provider reported at this time.  Education Status Is patient currently in school?: No Current Grade: NA Highest grade of school patient has completed: 10 Name of school: NA Contact person: NA  Risk to self with the past 6 months Suicidal Ideation: No Has patient been a risk to self within the past 6 months prior to admission? : No Suicidal Intent: No Has patient had any suicidal intent within the past 6 months prior to admission? : No Is patient at risk for suicide?: No Suicidal Plan?: No Has patient had any suicidal plan within the past 6 months prior to admission? : No Access to Means: No What has been your use of drugs/alcohol within the last 12 months?: No drug or alcohol use reported at this time.  Previous Attempts/Gestures: Yes How many times?: 1 (2014-hung self ) Other Self Harm Risks: No other self harm risk identified at this time.  Triggers for Past Attempts: Hallucinations ("Auditory hallucinations telling me that I am sorry". 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 ) Persecutory voices/beliefs?: No Depression: Yes Depression Symptoms: Feeling angry/irritable, Feeling worthless/self pity, Loss of interest in usual pleasures, Guilt, Isolating Substance abuse history and/or treatment for substance abuse?: No Suicide prevention  information given to non-admitted patients: Not applicable  Risk to Others within the past 6 months Homicidal Ideation: No Does patient have any lifetime risk of violence toward others beyond the six months prior to admission? : No Thoughts of Harm to Others: No Current Homicidal Intent: No Current Homicidal Plan: No Access to Homicidal Means: No Identified Victim: NA History of harm to others?: No Assessment of Violence: On admission Violent Behavior Description: No violent behaviors observed. Pt is calm and cooperative.  Does patient have access to weapons?: No Criminal Charges Pending?: No Does patient have a court date: No Is patient on probation?: No  Psychosis Hallucinations: None noted Delusions: None noted  Mental Status Report Appearance/Hygiene: In scrubs Eye Contact: Good Motor Activity: Freedom of movement Speech: Logical/coherent Level of Consciousness: Alert Mood: Euthymic, Pleasant Affect: Appropriate to circumstance Anxiety Level: Minimal Thought  Processes: Coherent, Relevant Judgement: Unimpaired Orientation: Appropriate for developmental age Obsessive Compulsive Thoughts/Behaviors: None  Cognitive Functioning Concentration: Normal Memory: Recent Impaired IQ: Average Insight: Good Impulse Control: Good Appetite: Good Weight Loss: 0 Weight Gain: 0 Sleep: No Change Total Hours of Sleep: 8 Vegetative Symptoms: None  ADLScreening [facility] Assessment Services) Patient's cognitive ability adequate to safely complete daily activities?: Yes Patient able to express need for assistance with ADLs?: Yes Independently performs ADLs?: Yes (appropriate for developmental age)  Prior Inpatient Therapy Prior Inpatient Therapy: Yes Prior Therapy Dates: 2014 Prior Therapy Facilty/Provider(s): [facility] Reason for Treatment: Suicide attempt  Prior Outpatient Therapy Prior Outpatient Therapy: Yes Prior Therapy Dates: 2014-present  Prior Therapy Facilty/Provider(s): Dr.  Zeb Comfort  Reason for Treatment: Depression  Does patient have an ACCT team?: No Does patient have Intensive In-House Services?  : No Does patient have [facility] services? : No Does patient have P4CC services?: No  ADL Screening (condition at time of admission) Patient's cognitive ability adequate to safely complete daily activities?: Yes Is the patient deaf or have difficulty hearing?: No Does the patient have difficulty seeing, even when wearing glasses/contacts?: No Does the patient have difficulty concentrating, remembering, or making decisions?: Yes ("I don't remember like I use to". ) Patient able to express need for assistance with ADLs?: Yes Does the patient have difficulty dressing or bathing?: No Independently performs ADLs?: Yes (appropriate for developmental age)       Abuse/Neglect Assessment (Assessment to be complete while patient is alone) Physical Abuse: Denies Verbal Abuse: Denies Sexual Abuse: Denies Exploitation of patient/patient's resources: Denies Self-Neglect: Denies     Regulatory affairs officer (For Healthcare) Does patient have an advance directive?: No Would patient like information on creating an advanced directive?: No - patient declined information    Additional Information 1:1 In Past 12 Months?: No CIRT Risk: No Elopement Risk: No     Disposition: Psychiatric consult  Disposition Initial Assessment Completed for this Encounter: Yes  Timotheus Salm S 04/23/2015 12:23 AM

## 2015-04-23 NOTE — ED Notes (Signed)
Bed: WA27 Expected date:  Expected time:  Means of arrival:  Comments: Hold for T3

## 2015-04-23 NOTE — ED Notes (Signed)
Pt belongings in [facility] [id number]

## 2015-04-23 NOTE — ED Notes (Signed)
Psych team in room 

## 2015-04-23 NOTE — ED Notes (Signed)
Pt up to restroom by self.

## 2015-04-23 NOTE — ED Notes (Signed)
Pt ambulated to restroom with steady gait.

## 2015-04-23 NOTE — Discharge Instructions (Signed)
Depression Depression is feeling sad, low, down in the dumps, blue, gloomy, or empty. In general, there are two kinds of depression:  Normal sadness or grief. This can happen after something upsetting. It often goes away on its own within 2 weeks. After losing a loved one (bereavement), normal sadness and grief may last longer than two weeks. It usually gets better with time.  Clinical depression. This kind lasts longer than normal sadness or grief. It keeps you from doing the things you normally do in life. It is often hard to function at home, work, or at school. It may affect your relationships with others. Treatment is often needed. GET HELP RIGHT AWAY IF:  You have thoughts about hurting yourself or others.  You lose touch with reality (psychotic symptoms). You may:  See or hear things that are not real.  Have untrue beliefs about your life or people around you.  Your medicine is giving you problems. MAKE SURE YOU:  Understand these instructions.  Will watch your condition.  Will get help right away if you are not doing well or get worse. Document Released: 01/04/2011 Document Revised: 04/18/2014 Document Reviewed: 04/02/2012 Exit[employer] Patient Information 2015 [employer]. This information is not intended to replace advice given to you by your health care provider. Make sure you discuss any questions you have with your health care provider.  

## 2015-05-21 ENCOUNTER — Emergency Department (HOSPITAL_COMMUNITY)
Admission: EM | Admit: 2015-05-21 | Discharge: 2015-05-21 | Disposition: A | Discharge status: Home / Self Care | Payer: Medicare Other | Attending: Emergency Medicine | Admitting: Emergency Medicine

## 2015-05-21 ENCOUNTER — Encounter (HOSPITAL_COMMUNITY): Payer: Self-pay | Admitting: Emergency Medicine

## 2015-05-21 DIAGNOSIS — I252 Old myocardial infarction: Secondary | ICD-10-CM | POA: Insufficient documentation

## 2015-05-21 DIAGNOSIS — Z7982 Long term (current) use of aspirin: Secondary | ICD-10-CM | POA: Diagnosis not present

## 2015-05-21 DIAGNOSIS — F911 Conduct disorder, childhood-onset type: Secondary | ICD-10-CM | POA: Diagnosis present

## 2015-05-21 DIAGNOSIS — Z8719 Personal history of other diseases of the digestive system: Secondary | ICD-10-CM | POA: Insufficient documentation

## 2015-05-21 DIAGNOSIS — Z86018 Personal history of other benign neoplasm: Secondary | ICD-10-CM | POA: Diagnosis not present

## 2015-05-21 DIAGNOSIS — Z79899 Other long term (current) drug therapy: Secondary | ICD-10-CM | POA: Diagnosis not present

## 2015-05-21 DIAGNOSIS — Z008 Encounter for other general examination: Secondary | ICD-10-CM | POA: Diagnosis not present

## 2015-05-21 DIAGNOSIS — Z862 Personal history of diseases of the blood and blood-forming organs and certain disorders involving the immune mechanism: Secondary | ICD-10-CM | POA: Diagnosis not present

## 2015-05-21 DIAGNOSIS — Z8546 Personal history of malignant neoplasm of prostate: Secondary | ICD-10-CM | POA: Diagnosis not present

## 2015-05-21 DIAGNOSIS — Z8709 Personal history of other diseases of the respiratory system: Secondary | ICD-10-CM | POA: Diagnosis not present

## 2015-05-21 DIAGNOSIS — I10 Essential (primary) hypertension: Secondary | ICD-10-CM | POA: Diagnosis not present

## 2015-05-21 DIAGNOSIS — Z8639 Personal history of other endocrine, nutritional and metabolic disease: Secondary | ICD-10-CM | POA: Diagnosis not present

## 2015-05-21 DIAGNOSIS — Z87891 Personal history of nicotine dependence: Secondary | ICD-10-CM | POA: Diagnosis not present

## 2015-05-21 DIAGNOSIS — F0390 Unspecified dementia without behavioral disturbance: Secondary | ICD-10-CM | POA: Insufficient documentation

## 2015-05-21 DIAGNOSIS — I2510 Atherosclerotic heart disease of native coronary artery without angina pectoris: Secondary | ICD-10-CM | POA: Insufficient documentation

## 2015-05-21 LAB — CBC
HCT: 42.1 % (ref 39.0–52.0)
Hemoglobin: 13.8 g/dL (ref 13.0–17.0)
MCH: 27.8 pg (ref 26.0–34.0)
MCHC: 32.8 g/dL (ref 30.0–36.0)
MCV: 84.7 fL (ref 78.0–100.0)
Platelets: 221 10*3/uL (ref 150–400)
RBC: 4.97 MIL/uL (ref 4.22–5.81)
RDW: 15.1 % (ref 11.5–15.5)
WBC: 6 10*3/uL (ref 4.0–10.5)

## 2015-05-21 LAB — RAPID URINE DRUG SCREEN, HOSP PERFORMED
Amphetamines: NOT DETECTED
Barbiturates: NOT DETECTED
Benzodiazepines: NOT DETECTED
Cocaine: NOT DETECTED
Opiates: NOT DETECTED
Tetrahydrocannabinol: NOT DETECTED

## 2015-05-21 LAB — COMPREHENSIVE METABOLIC PANEL
ALT: 15 U/L — ABNORMAL LOW (ref 17–63)
AST: 22 U/L (ref 15–41)
Albumin: 4.3 g/dL (ref 3.5–5.0)
Alkaline Phosphatase: 73 U/L (ref 38–126)
Anion gap: 7 (ref 5–15)
BUN: 18 mg/dL (ref 6–20)
CO2: 27 mmol/L (ref 22–32)
Calcium: 9.2 mg/dL (ref 8.9–10.3)
Chloride: 104 mmol/L (ref 101–111)
Creatinine, Ser: 1.22 mg/dL (ref 0.61–1.24)
GFR calc Af Amer: >60 mL/min (ref >60)
GFR calc non Af Amer: 58 mL/min — ABNORMAL LOW (ref >60)
Glucose, Bld: 103 mg/dL — ABNORMAL HIGH (ref 65–99)
Potassium: 3.9 mmol/L (ref 3.5–5.1)
Sodium: 138 mmol/L (ref 135–145)
Total Bilirubin: 0.4 mg/dL (ref 0.3–1.2)
Total Protein: 7.6 g/dL (ref 6.5–8.1)

## 2015-05-21 LAB — SALICYLATE LEVEL: Salicylate Lvl: <4 mg/dL (ref 2.8–30.0)

## 2015-05-21 LAB — ACETAMINOPHEN LEVEL: Acetaminophen (Tylenol), Serum: <10 ug/mL — ABNORMAL LOW (ref 10–30)

## 2015-05-21 LAB — ETHANOL: Alcohol, Ethyl (B): <5 mg/dL (ref <5)

## 2015-05-21 IMAGING — CT CT CHEST W/ CM
2 of 4 series · 15 of 36 positions shown, 18 images · IV contrast (Omnipaque 300)
Comparison: Chest CT 01/01/2013

CLINICAL DATA: Followup right lower lobe nodule. Chronic cough.
History of prostate cancer.

EXAM:
CT CHEST WITH CONTRAST
TECHNIQUE: Multidetector CT imaging of the chest was performed during
intravenous contrast administration.
CONTRAST:  80mL OMNIPAQUE IOHEXOL 300 MG/ML  SOLN

[Series 2: chest routine with · axial · 0.72mm/px · z∈[-319,-34]mm · 12 of 69 slices shown, 15 images]
[im 6/69  mediastinal]
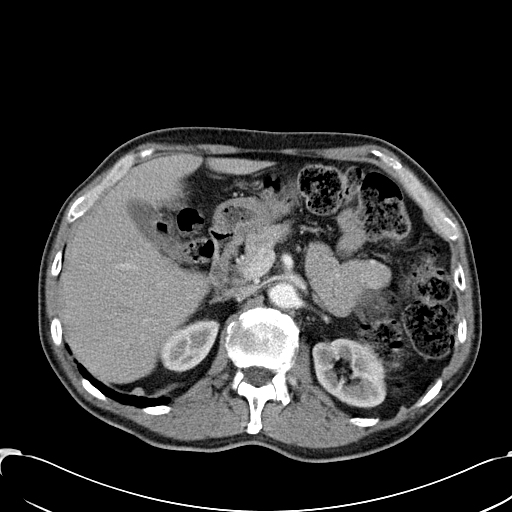
[im 6/69  lung]
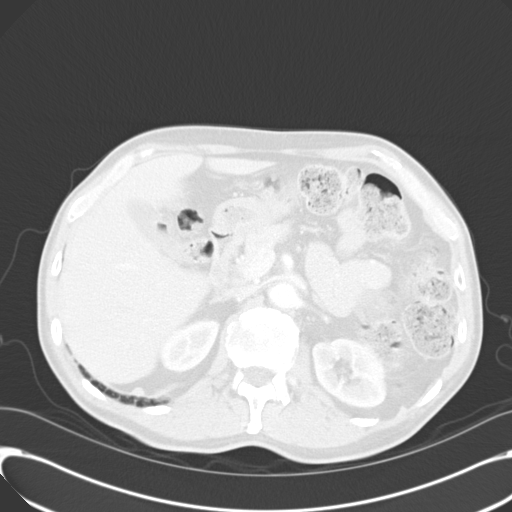
[im 11/69  lung]
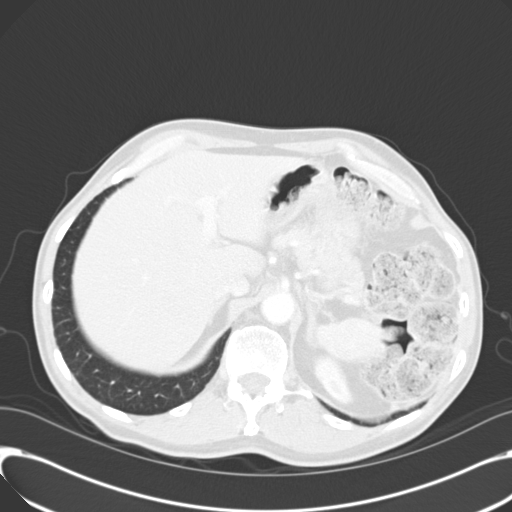
[im 16/69  lung]
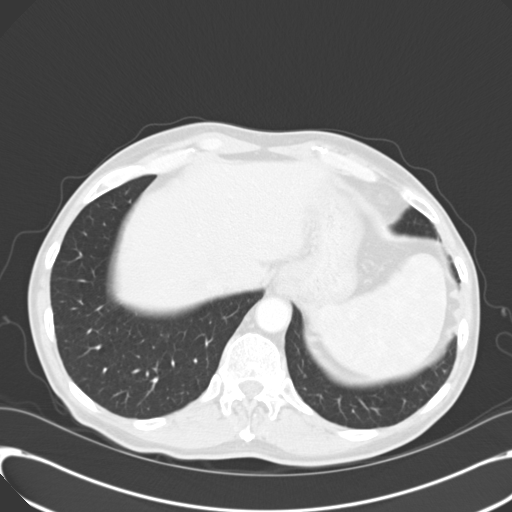
[im 21/69  lung]
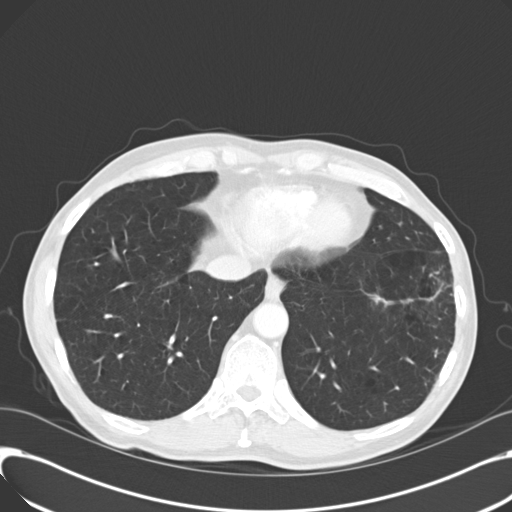
[im 27/69  mediastinal]
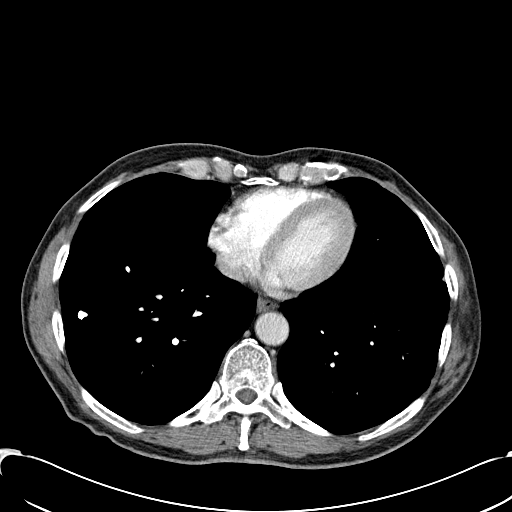
[im 27/69  lung]
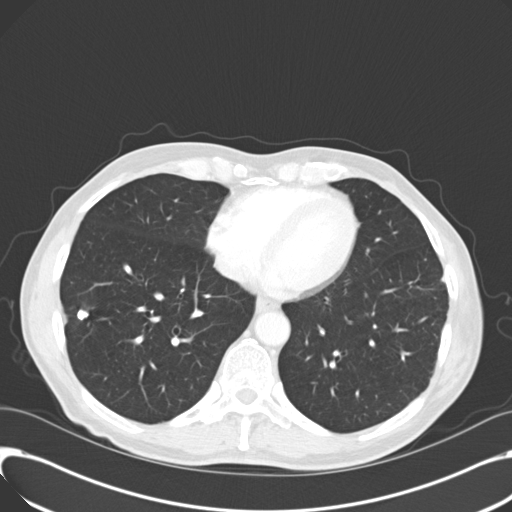
[im 32/69  lung]
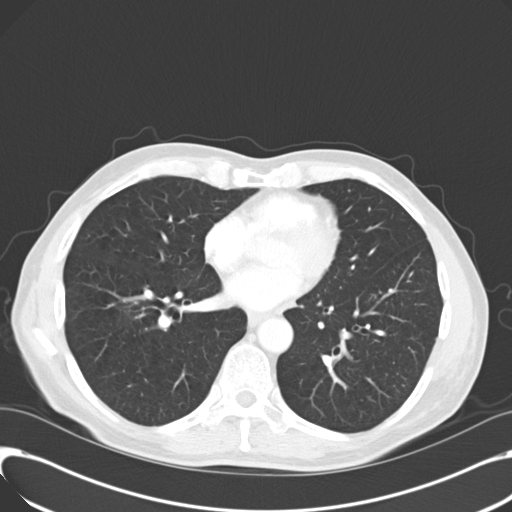
[im 37/69  lung]
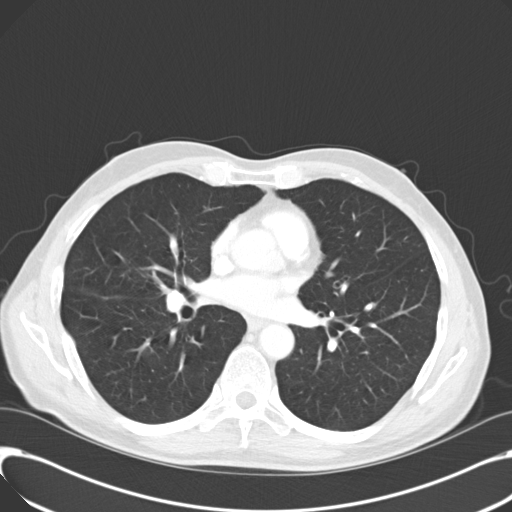
[im 42/69  lung]
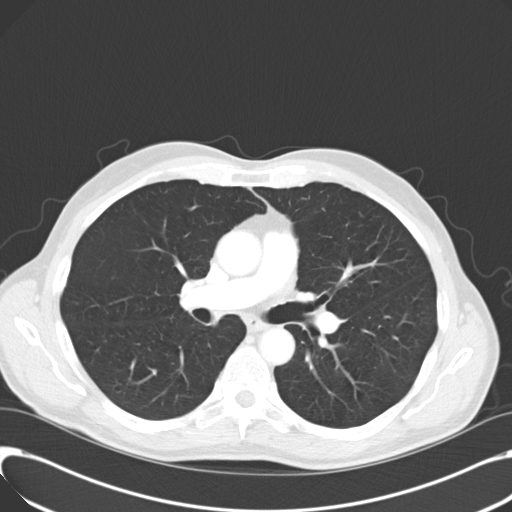
[im 48/69  mediastinal]
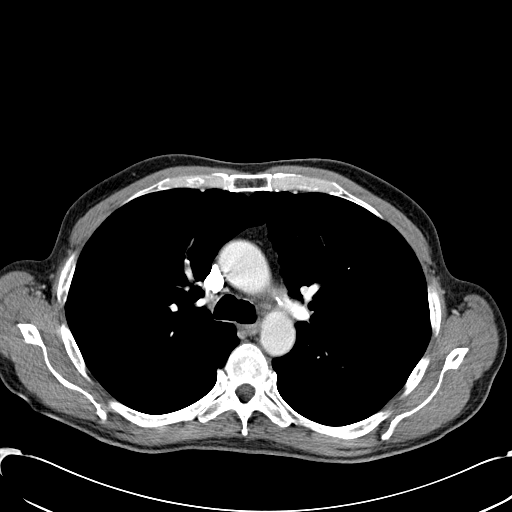
[im 48/69  lung]
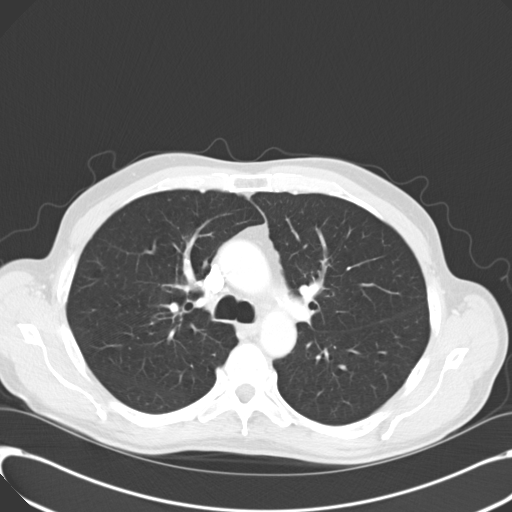
[im 53/69  lung]
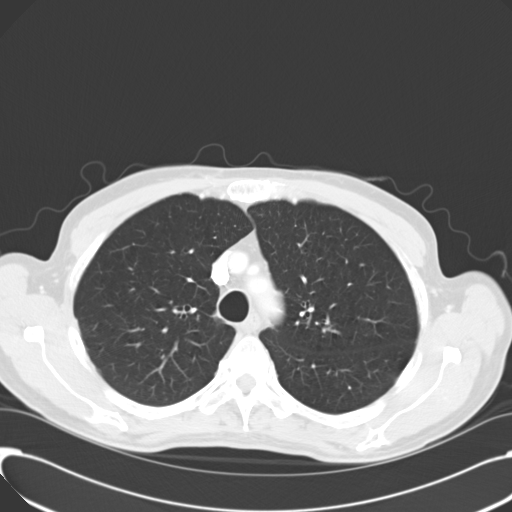
[im 58/69  lung]
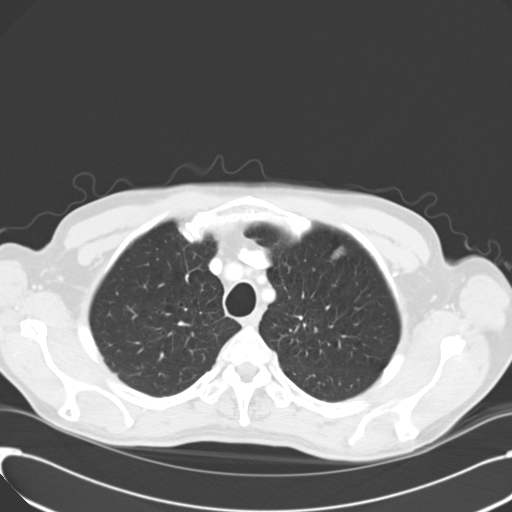
[im 63/69  lung]
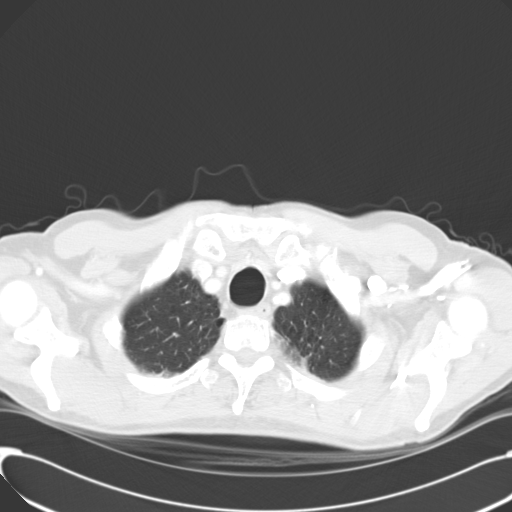

[Series 602: cor · coronal · 0.72mm/px · 3 of 105 slices shown]
[im 21/105  lung]
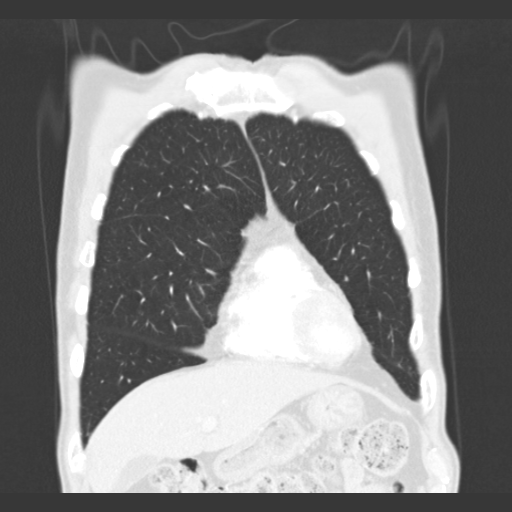
[im 42/105  lung]
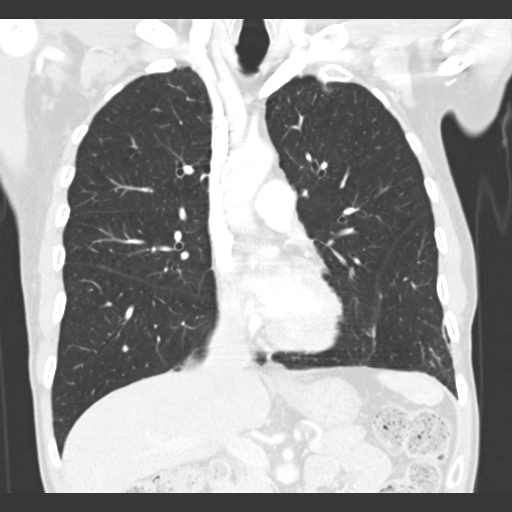
[im 63/105  lung]
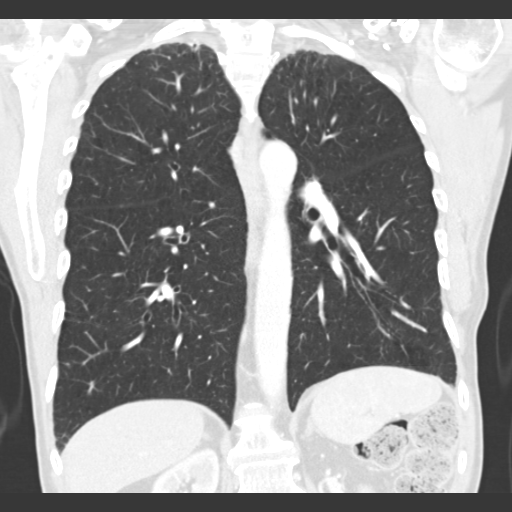

[15 of 36 positions shown; findings below may reference images not displayed]

FINDINGS: There are no enlarged mediastinal or hilar lymph nodes. Scattered
atherosclerosis of the aorta, great vessels and coronary arteries is
again noted. There is no pleural or pericardial effusion.

Calcified right infrahilar lymph nodes and right lower lobe
pulmonary granulomas are again noted. The 3 mm right lower lobe
noncalcified nodule on image 50 is stable. There are no new or
enlarging nodules. There is stable mild emphysema and biapical
scarring.

The visualized upper abdomen appears unremarkable.
IMPRESSION: Stable chest CT. There is evidence of prior granulomatous disease. A
stable 3 mm noncalcified right lower lobe nodule likely represents a
noncalcified granuloma.

## 2015-05-21 NOTE — BH Assessment (Addendum)
Tele Assessment Note   Anthony Skinner is an 74 y.o. male, married, Caucasian who presents to [facility] ED accompanied by his wife, Daron Stutz [phone], who participated in assessment at Pt's request. Pt has a history of depression and states he has the beginning stages of Alzheimer's disease. He reports that tonight he became angry for no apparent reason and yelled at his wife. Pt did not verbally threaten her or use profanity. According to Pt's wife he yelled "I'm tired of all this!" which frightened her. Pt has had anger outbursts in the past with the last outburst in April 2016. Pt's psychiatrist instructed Pt and his wife that if he should have an outburst he should go to an ED for evaluation. Pt feels guilty about his outburst and says "my wife is a cardiac patient and doesn't need this." Pt reports his mood has been "pretty good" lately. He denies most depressive symptoms but does report loss of interest in usual pleasures, decreased concentration and memory loss. Pt reports sleep and appetite are good. He denies current or recent suicidal ideation but he does have a history of attempting suicide by hanging in 2014. Pt reports at that time he was experiencing auditory hallucinations but he has not experienced any hallucinations since 2014. He denies homicidal ideation or history of violence. Pt states there are no guns in the home. Wife states that Pt has not been physically aggressive. Pt reports he used to drink alcohol but denies any recent alcohol use and no history of substance abuse.  Pt lives with his wife and states she takes care of bills and other household responsibilities. He cannot identify any particular stressors. Pt reports his father was an alcoholic and that Pt may have unresolved issues from childhood. He identifies his wife and her children as his primary support. Pt states he enjoys his religious faith, NASCAR and reading. Pt receives outpatient medication management with  Dr. Zeb Comfort and his next appointment is scheduled for 06/07/15. He has a history of one previous inpatient psychiatric hospitalization at [facility] in 2014 following his suicide attempt.  Pt is casually dressed, alert, oriented x4 with normal speech and normal motor behavior. Eye contact is good. Pt's mood is guilty and affect is congruent with mood. Thought process is coherent and relevant. There is no indication Pt is currently responding to internal stimuli or experiencing delusional thought content. Pt was pleasant and cooperative throughout assessment. Pt feels safe to return home tonight but feels he may need to see his psychiatrist every month rather than every two months. Pt's wife says she feels safe with Pt returning home but she feels like when he has these outbursts he needs to be evaluated.   Axis I: Major Depressive Disorder, Moderate Axis II: Deferred Axis III:  Past Medical History  Diagnosis Date  . Iron deficiency anemia   . Tubular adenoma of colon 2012  . Gastropathy 2012    reactive  . PUD (peptic ulcer disease)   . Prostate cancer     a. 09/2008 s/p prostatectomy.  . CAD (coronary artery disease)     a. reported h/o MI in the 64's;  b. 04/2000 Cath: LM nl, LAD 40p, D1 small, nl, RI nl, LCX nl, RCA nl.  . Hyperlipidemia   . Vertebral artery stenosis     a. 09/2010 s/p L vertebral stenting 09/2010.  Marland Kitchen GERD (gastroesophageal reflux disease)   . Hiatal hernia   . Recurrent spontaneous pneumothorax  a. s/p L lobectomy in 1966.  Marland Kitchen DJD (degenerative joint disease)   . Hypertension   . Upper GI bleed     a. 2012  . Fatty liver   . Hypertension   . Syncope     a. in setting of GIB in 2012, presumed to be orthostatic.  . Suicide attempt     a. 08/2013 attempt by hanging with subsequent resp failure  . Myocardial infarction     " BACK IN THE 90'S"  . Dementia   . Shortness of breath    Axis IV: other psychosocial or environmental problems Axis V:  GAF=55  Past Medical History:  Past Medical History  Diagnosis Date  . Iron deficiency anemia   . Tubular adenoma of colon 2012  . Gastropathy 2012    reactive  . PUD (peptic ulcer disease)   . Prostate cancer     a. 09/2008 s/p prostatectomy.  . CAD (coronary artery disease)     a. reported h/o MI in the 64's;  b. 04/2000 Cath: LM nl, LAD 40p, D1 small, nl, RI nl, LCX nl, RCA nl.  . Hyperlipidemia   . Vertebral artery stenosis     a. 09/2010 s/p L vertebral stenting 09/2010.  Marland Kitchen GERD (gastroesophageal reflux disease)   . Hiatal hernia   . Recurrent spontaneous pneumothorax     a. s/p L lobectomy in 1966.  Marland Kitchen DJD (degenerative joint disease)   . Hypertension   . Upper GI bleed     a. 2012  . Fatty liver   . Hypertension   . Syncope     a. in setting of GIB in 2012, presumed to be orthostatic.  . Suicide attempt     a. 08/2013 attempt by hanging with subsequent resp failure  . Myocardial infarction     " BACK IN THE 90'S"  . Dementia   . Shortness of breath     Past Surgical History  Procedure Laterality Date  . Prostatectomy    . Vertebral artery stent    . Lung removal, partial  1960s    left  . Cataract extraction Right   . Cardiac surgery    . Cardiac catheterization  09/15/2013  . Left heart catheterization with coronary angiogram N/A 09/15/2013    Procedure: LEFT HEART CATHETERIZATION WITH CORONARY ANGIOGRAM;  Surgeon: Burnell Blanks, MD;  Location: [facility] CATH LAB;  Service: Cardiovascular;  Laterality: N/A;    Family History:  Family History  Problem Relation Age of Onset  . Heart attack Father   . Alzheimer's disease Mother   . Prostate cancer Brother     Social History:  reports that he quit smoking about 12 years ago. His smoking use included Cigarettes. He has a 40 pack-year smoking history. He has never used smokeless tobacco. He reports that he does not drink alcohol or use illicit drugs.  Additional Social History:  Alcohol / Drug Use Pain  Medications: Denies abuse Prescriptions: Denies abuse Over the Counter: Denies abuse History of alcohol / drug use?: No history of alcohol / drug abuse Longest period of sobriety (when/how long): NA  CIWA: CIWA-Ar BP: (!) 142/46 mmHg Pulse Rate: 62 COWS:    PATIENT STRENGTHS: (choose at least two) Ability for insight Active sense of humor Average or above average intelligence Capable of independent living Occupational psychologist fund of knowledge Motivation for treatment/growth Physical Health Religious Affiliation Supportive family/friends  Allergies: No Known Allergies  Home Medications:  (Not in  a hospital admission)  OB/GYN Status:  No LMP for male patient.  General Assessment Data Location of Assessment: [facility] TTS Assessment: In system Is this a Tele or Face-to-Face Assessment?: Tele Assessment Is this an Initial Assessment or a Re-assessment for this encounter?: Initial Assessment Marital status: Married [place] name: NA Is patient pregnant?: No Pregnancy Status: No Living Arrangements: Spouse/significant other Can pt return to current living arrangement?: Yes Admission Status: Voluntary Is patient capable of signing voluntary admission?: Yes Referral Source: Self/Family/Friend Insurance type: Medicare     Crisis Care Plan Living Arrangements: Spouse/significant other Name of Psychiatrist: Dr. Zeb Comfort  Name of Therapist: No provider reported at this time.  Education Status Is patient currently in school?: No Current Grade: NA Highest grade of school patient has completed: 10 Name of school: NA Contact person: NA  Risk to self with the past 6 months Suicidal Ideation: No Has patient been a risk to self within the past 6 months prior to admission? : No Suicidal Intent: No Has patient had any suicidal intent within the past 6 months prior to admission? : No Is patient at risk for suicide?: No Suicidal Plan?: No Has patient had  any suicidal plan within the past 6 months prior to admission? : No Access to Means: No What has been your use of drugs/alcohol within the last 12 months?: Pt denies Previous Attempts/Gestures: Yes How many times?: 1 (Pt attempted to hang himself in 2014) Other Self Harm Risks: None Triggers for Past Attempts: Hallucinations Intentional Self Injurious Behavior: None Family Suicide History: Yes (Grandfather) Recent stressful life event(s): Other (Comment) (Memory loss) Persecutory voices/beliefs?: No Depression: Yes Depression Symptoms: Feeling angry/irritable, Loss of interest in usual pleasures Substance abuse history and/or treatment for substance abuse?: No Suicide prevention information given to non-admitted patients: Yes  Risk to Others within the past 6 months Homicidal Ideation: No Does patient have any lifetime risk of violence toward others beyond the six months prior to admission? : No Thoughts of Harm to Others: No Current Homicidal Intent: No Current Homicidal Plan: No Access to Homicidal Means: No Identified Victim: None History of harm to others?: No Assessment of Violence: On admission Violent Behavior Description: Pt yelled at wife. Did not threaten her. Does patient have access to weapons?: No Criminal Charges Pending?: No Does patient have a court date: No Is patient on probation?: No  Psychosis Hallucinations: None noted Delusions: None noted  Mental Status Report Appearance/Hygiene: Other (Comment) (Casually dressed) Eye Contact: Good Motor Activity: Unremarkable Speech: Logical/coherent Level of Consciousness: Alert Mood: Guilty Affect: Appropriate to circumstance Anxiety Level: Minimal Thought Processes: Coherent, Relevant Judgement: Unimpaired Orientation: Appropriate for developmental age, Person, Place, Time, Situation Obsessive Compulsive Thoughts/Behaviors: None  Cognitive Functioning Concentration: Decreased Memory: Recent Intact, Remote  Intact IQ: Average Insight: Good Impulse Control: Fair Appetite: Good Weight Loss: 0 Weight Gain: 0 Sleep: No Change Total Hours of Sleep: 8 Vegetative Symptoms: None  ADLScreening [facility] Assessment Services) Patient's cognitive ability adequate to safely complete daily activities?: Yes Patient able to express need for assistance with ADLs?: Yes Independently performs ADLs?: Yes (appropriate for developmental age)  Prior Inpatient Therapy Prior Inpatient Therapy: Yes Prior Therapy Dates: 2014 Prior Therapy Facilty/Provider(s): [facility] Reason for Treatment: Suicide attempt  Prior Outpatient Therapy Prior Outpatient Therapy: Yes Prior Therapy Dates: 2014-present  Prior Therapy Facilty/Provider(s): Dr. Zeb Comfort  Reason for Treatment: Depression  Does patient have an ACCT team?: No Does patient have Intensive In-House Services?  : No Does patient have  [facility] services? : No Does patient have P4CC services?: No  ADL Screening (condition at time of admission) Patient's cognitive ability adequate to safely complete daily activities?: Yes Is the patient deaf or have difficulty hearing?: No Does the patient have difficulty seeing, even when wearing glasses/contacts?: No Does the patient have difficulty concentrating, remembering, or making decisions?: No Patient able to express need for assistance with ADLs?: Yes Does the patient have difficulty dressing or bathing?: No Independently performs ADLs?: Yes (appropriate for developmental age) Does the patient have difficulty walking or climbing stairs?: No Weakness of Legs: None Weakness of Arms/Hands: None  Home Assistive Devices/Equipment Home Assistive Devices/Equipment: None    Abuse/Neglect Assessment (Assessment to be complete while patient is alone) Physical Abuse: Denies Verbal Abuse: Denies Sexual Abuse: Denies Exploitation of patient/patient's resources: Denies Self-Neglect: Denies     Armed forces training and education officer (For Healthcare) Does patient have an advance directive?: Yes Would patient like information on creating an advanced directive?: No - patient declined information Type of Advance Directive: Living will Copy of advanced directive(s) in chart?: No - copy requested    Additional Information 1:1 In Past 12 Months?: No CIRT Risk: No Elopement Risk: No Does patient have medical clearance?: Yes     Disposition: Consulted with Arlester Marker, NP who says Pt does not meet criteria for inpatient psychiatric treatment and recommends Pt contact his psychiatrist for the first available appointment. Communicated recommendation to Alvina Chou, PA-C who agreed with plan.  Disposition Initial Assessment Completed for this Encounter: Yes Disposition of Patient: Outpatient treatment Type of outpatient treatment: Adult   Evelena Peat, [facility], [facility], [facility] Triage Specialist [phone]   Evelena Peat 05/21/2015 10:35 PM
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
## 2015-05-21 NOTE — BH Assessment (Signed)
Received notification of TTS consult request. Spoke to [facility], PA-C who said Pt has dementia and became upset and aggressive tonight. Tele-assessment will be initiated.  Orpah Greek Anson Fret, [place], [facility], [facility] Triage Specialist [phone]

## 2015-05-23 ENCOUNTER — Encounter (HOSPITAL_COMMUNITY): Payer: Self-pay | Admitting: *Deleted

## 2015-05-23 ENCOUNTER — Emergency Department (HOSPITAL_COMMUNITY)
Admission: EM | Admit: 2015-05-23 | Discharge: 2015-05-25 | Disposition: A | Discharge status: Other Acute Inpatient Hospital | Payer: Medicare Other | Attending: Emergency Medicine | Admitting: Emergency Medicine

## 2015-05-23 DIAGNOSIS — I10 Essential (primary) hypertension: Secondary | ICD-10-CM | POA: Diagnosis not present

## 2015-05-23 DIAGNOSIS — Z8711 Personal history of peptic ulcer disease: Secondary | ICD-10-CM | POA: Insufficient documentation

## 2015-05-23 DIAGNOSIS — Z9889 Other specified postprocedural states: Secondary | ICD-10-CM | POA: Diagnosis not present

## 2015-05-23 DIAGNOSIS — I2510 Atherosclerotic heart disease of native coronary artery without angina pectoris: Secondary | ICD-10-CM | POA: Insufficient documentation

## 2015-05-23 DIAGNOSIS — Z01818 Encounter for other preprocedural examination: Secondary | ICD-10-CM

## 2015-05-23 DIAGNOSIS — Z008 Encounter for other general examination: Secondary | ICD-10-CM | POA: Diagnosis present

## 2015-05-23 DIAGNOSIS — Z8546 Personal history of malignant neoplasm of prostate: Secondary | ICD-10-CM | POA: Diagnosis not present

## 2015-05-23 DIAGNOSIS — Z7982 Long term (current) use of aspirin: Secondary | ICD-10-CM | POA: Diagnosis not present

## 2015-05-23 DIAGNOSIS — F911 Conduct disorder, childhood-onset type: Secondary | ICD-10-CM | POA: Diagnosis not present

## 2015-05-23 DIAGNOSIS — F0391 Unspecified dementia with behavioral disturbance: Secondary | ICD-10-CM | POA: Insufficient documentation

## 2015-05-23 DIAGNOSIS — Z862 Personal history of diseases of the blood and blood-forming organs and certain disorders involving the immune mechanism: Secondary | ICD-10-CM | POA: Insufficient documentation

## 2015-05-23 DIAGNOSIS — Z86018 Personal history of other benign neoplasm: Secondary | ICD-10-CM | POA: Insufficient documentation

## 2015-05-23 DIAGNOSIS — R4689 Other symptoms and signs involving appearance and behavior: Secondary | ICD-10-CM

## 2015-05-23 DIAGNOSIS — Z79899 Other long term (current) drug therapy: Secondary | ICD-10-CM | POA: Insufficient documentation

## 2015-05-23 DIAGNOSIS — Z87891 Personal history of nicotine dependence: Secondary | ICD-10-CM | POA: Diagnosis not present

## 2015-05-23 DIAGNOSIS — I252 Old myocardial infarction: Secondary | ICD-10-CM | POA: Diagnosis not present

## 2015-05-23 DIAGNOSIS — F329 Major depressive disorder, single episode, unspecified: Secondary | ICD-10-CM

## 2015-05-23 DIAGNOSIS — Z8719 Personal history of other diseases of the digestive system: Secondary | ICD-10-CM | POA: Insufficient documentation

## 2015-05-23 LAB — COMPREHENSIVE METABOLIC PANEL
ALT: 15 U/L — ABNORMAL LOW (ref 17–63)
AST: 22 U/L (ref 15–41)
Albumin: 4.4 g/dL (ref 3.5–5.0)
Alkaline Phosphatase: 72 U/L (ref 38–126)
Anion gap: 8 (ref 5–15)
BUN: 17 mg/dL (ref 6–20)
CO2: 26 mmol/L (ref 22–32)
Calcium: 9.5 mg/dL (ref 8.9–10.3)
Chloride: 106 mmol/L (ref 101–111)
Creatinine, Ser: 1.27 mg/dL — ABNORMAL HIGH (ref 0.61–1.24)
GFR calc Af Amer: >60 mL/min (ref >60)
GFR calc non Af Amer: 55 mL/min — ABNORMAL LOW (ref >60)
Glucose, Bld: 108 mg/dL — ABNORMAL HIGH (ref 65–99)
Potassium: 4.3 mmol/L (ref 3.5–5.1)
Sodium: 140 mmol/L (ref 135–145)
Total Bilirubin: 0.7 mg/dL (ref 0.3–1.2)
Total Protein: 8 g/dL (ref 6.5–8.1)

## 2015-05-23 LAB — ACETAMINOPHEN LEVEL: Acetaminophen (Tylenol), Serum: <10 ug/mL — ABNORMAL LOW (ref 10–30)

## 2015-05-23 LAB — CBC
HCT: 41.7 % (ref 39.0–52.0)
Hemoglobin: 13.9 g/dL (ref 13.0–17.0)
MCH: 27.7 pg (ref 26.0–34.0)
MCHC: 33.3 g/dL (ref 30.0–36.0)
MCV: 83.2 fL (ref 78.0–100.0)
Platelets: 211 10*3/uL (ref 150–400)
RBC: 5.01 MIL/uL (ref 4.22–5.81)
RDW: 14.9 % (ref 11.5–15.5)
WBC: 5.1 10*3/uL (ref 4.0–10.5)

## 2015-05-23 LAB — SALICYLATE LEVEL: Salicylate Lvl: <4 mg/dL (ref 2.8–30.0)

## 2015-05-23 LAB — RAPID URINE DRUG SCREEN, HOSP PERFORMED
Amphetamines: NOT DETECTED
Barbiturates: NOT DETECTED
Benzodiazepines: NOT DETECTED
Cocaine: NOT DETECTED
Opiates: NOT DETECTED
Tetrahydrocannabinol: NOT DETECTED

## 2015-05-23 LAB — ETHANOL: Alcohol, Ethyl (B): <5 mg/dL (ref <5)

## 2015-05-23 MED ORDER — NICOTINE 21 MG/24HR TD PT24
21.0000 mg | MEDICATED_PATCH | Freq: Every day | TRANSDERMAL | Status: DC
  Filled 2015-05-23: qty 1

## 2015-05-23 MED ORDER — LORAZEPAM 1 MG PO TABS: 1.0000 mg | ORAL_TABLET | Freq: Three times a day (TID) | ORAL | Status: DC | PRN

## 2015-05-23 MED ORDER — SERTRALINE HCL 50 MG PO TABS
50.0000 mg | ORAL_TABLET | Freq: Three times a day (TID) | ORAL | Status: DC
  Administered 2015-05-24 – 2015-05-25 (×5): 50 mg via ORAL
  Filled 2015-05-23 (×6): qty 1

## 2015-05-23 MED ORDER — LAMOTRIGINE 25 MG PO TABS
25.0000 mg | ORAL_TABLET | Freq: Every day | ORAL | Status: DC
  Administered 2015-05-24 – 2015-05-25 (×2): 25 mg via ORAL
  Filled 2015-05-23 (×2): qty 1

## 2015-05-23 MED ORDER — ONDANSETRON HCL 4 MG PO TABS: 4.0000 mg | ORAL_TABLET | Freq: Three times a day (TID) | ORAL | Status: DC | PRN

## 2015-05-23 MED ORDER — ALUM & MAG HYDROXIDE-SIMETH 200-200-20 MG/5ML PO SUSP: 30.0000 mL | ORAL | Status: DC | PRN

## 2015-05-23 MED ORDER — ASPIRIN 81 MG PO CHEW
81.0000 mg | CHEWABLE_TABLET | Freq: Every day | ORAL | Status: DC
  Administered 2015-05-24 – 2015-05-25 (×2): 81 mg via ORAL
  Filled 2015-05-23 (×2): qty 1

## 2015-05-23 MED ORDER — CLONAZEPAM 0.5 MG PO TABS
0.1250 mg | ORAL_TABLET | Freq: Every day | ORAL | Status: DC
  Administered 2015-05-24 (×2): 0.25 mg via ORAL
  Filled 2015-05-23 (×2): qty 1

## 2015-05-23 MED ORDER — RISPERIDONE 0.5 MG PO TABS
0.5000 mg | ORAL_TABLET | Freq: Every day | ORAL | Status: DC
  Administered 2015-05-24 (×2): 0.5 mg via ORAL
  Filled 2015-05-23 (×2): qty 1

## 2015-05-23 NOTE — BH Assessment (Signed)
Assessment completed. Consulted Patriciaann Clan, PA-C recommends inpatient treatment. No appropriate beds at [facility]. TTS will contact other facilities for placement. Dr. Canary Brim has been informed of the recommendation.
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
## 2015-05-24 ENCOUNTER — Emergency Department (HOSPITAL_COMMUNITY): Payer: Medicare Other

## 2015-05-24 DIAGNOSIS — F329 Major depressive disorder, single episode, unspecified: Secondary | ICD-10-CM | POA: Diagnosis not present

## 2015-05-24 LAB — URINALYSIS, ROUTINE W REFLEX MICROSCOPIC
Bilirubin Urine: NEGATIVE
Glucose, UA: NEGATIVE mg/dL
Ketones, ur: NEGATIVE mg/dL
Leukocytes, UA: NEGATIVE
Nitrite: NEGATIVE
Protein, ur: NEGATIVE mg/dL
Specific Gravity, Urine: 1.002 — ABNORMAL LOW (ref 1.005–1.030)
Urobilinogen, UA: 0.2 mg/dL (ref 0.0–1.0)
pH: 6 (ref 5.0–8.0)

## 2015-05-24 LAB — URINE MICROSCOPIC-ADD ON

## 2015-05-24 NOTE — ED Notes (Signed)
Patient is resting comfortably. 

## 2015-05-24 NOTE — BH Assessment (Signed)
Dr. Darleene Cleaver and Clovis Cao, NP recommend inpatient [facility] psych treatment. TTS to seek appropriate placement. Patient referred to [place], [place], [place], [place], [place], and [place]

## 2015-05-24 NOTE — Consult Note (Signed)
[facility] Consult   Reason for Consult:  Major depression, agitation, aggression Referring Physician:  EDP Patient Identification: JALIK GELLATLY MRN:  [id number] Principal Diagnosis: Major depressive disorder without psychotic features Diagnosis:   Patient Active Problem List   Diagnosis Date Noted  . Major depressive disorder without psychotic features [F32.9] 05/24/2015    Priority: High  . Adjustment disorder with disturbance of emotion [F43.29] 04/23/2015  . Aggressive behavior [F60.89]   . Chest pain [R07.9] 09/15/2013  . Coronary atherosclerosis of native coronary artery [I25.10] 09/15/2013  . Hypokalemia [E87.6] 08/28/2013  . Acute respiratory failure [J96.00] 08/25/2013  . Altered mental status [R41.82] 08/25/2013  . Anoxic brain injury [G93.1] 08/25/2013  . HTN (hypertension) [I10] 08/25/2013  . Suicide attempt [T14.91] 08/25/2013  . Vitamin B 12 deficiency [E53.8] 07/15/2013  . Unspecified hereditary and idiopathic peripheral neuropathy [G60.9] 07/15/2013  . Memory loss [R41.3] 07/15/2013    Total Time spent with patient: 1 hour  Subjective:   Anthony Skinner is a 74 y.o. male patient admitted with  Major depression, agitation, aggression  HPI:  Caucasian male, 74 years old was evaluated this morning for aggression towards his wife.  His wife was in the room during the interview.   Patient was brought in by his wife early May fr same anger and aggressive behavior and was hospitalized for few days at [facility] and sent home.  Patient reported that he gets angry easily for no reasons,yells at the wife.   Patient reports memory issues and stated that he is beginning to forget things. He also admitted that he is easy to attack his wife and stated I am  worried that things can go out of control"  He denies SI/HI/AVH.  He has been accepted for admission and we will be seeking placement at any gero-psychiatric unit.  HPI Elements:   Location:  Major  depressive disorder without Psychosis, anger, agiatation. Quality:  severe. Severity:  severe. Timing:  acute. Duration:  5 month. Context:  brought in by his wife for agiataion and aggression towards her..  Past Medical History:  Past Medical History  Diagnosis Date  . Iron deficiency anemia   . Tubular adenoma of colon 2012  . Gastropathy 2012    reactive  . PUD (peptic ulcer disease)   . Prostate cancer     a. 09/2008 s/p prostatectomy.  . CAD (coronary artery disease)     a. reported h/o MI in the 58's;  b. 04/2000 Cath: LM nl, LAD 40p, D1 small, nl, RI nl, LCX nl, RCA nl.  . Hyperlipidemia   . Vertebral artery stenosis     a. 09/2010 s/p L vertebral stenting 09/2010.  Marland Kitchen GERD (gastroesophageal reflux disease)   . Hiatal hernia   . Recurrent spontaneous pneumothorax     a. s/p L lobectomy in 1966.  Marland Kitchen DJD (degenerative joint disease)   . Hypertension   . Upper GI bleed     a. 2012  . Fatty liver   . Hypertension   . Syncope     a. in setting of GIB in 2012, presumed to be orthostatic.  . Suicide attempt     a. 08/2013 attempt by hanging with subsequent resp failure  . Myocardial infarction     " BACK IN THE 90'S"  . Dementia   . Shortness of breath     Past Surgical History  Procedure Laterality Date  . Prostatectomy    . Vertebral artery stent    .  Lung removal, partial  1960s    left  . Cataract extraction Right   . Cardiac surgery    . Cardiac catheterization  09/15/2013  . Left heart catheterization with coronary angiogram N/A 09/15/2013    Procedure: LEFT HEART CATHETERIZATION WITH CORONARY ANGIOGRAM;  Surgeon: Burnell Blanks, MD;  [facility] LAB;  Service: Cardiovascular;  Laterality: N/A;   Family History:  Family History  Problem Relation Age of Onset  . Heart attack Father   . Alzheimer's disease Mother   . Prostate cancer Brother    Social History:  History  Alcohol Use No    Comment: Hx heavy EtOH use but quit 2011     History   Drug Use No    History   Social History  . Marital Status: Married    Spouse Name: Terri Piedra  . Number of Children: 0  . Years of Education: 12th   Occupational History  . retired    Social History Main Topics  . Smoking status: Former Smoker -- 1.00 packs/day for 40 years    Types: Cigarettes    Quit date: 08/27/2002  . Smokeless tobacco: Never Used  . Alcohol Use: No     Comment: Hx heavy EtOH use but quit 2011  . Drug Use: No  . Sexual Activity: Not on file   Other Topics Concern  . None   Social History Narrative   Lives in [place] with wife.  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   Caffeine Use: 4 cups daily       Additional Social History:    Pain Medications: Denies abuse Prescriptions: Denies abuse History of alcohol / drug use?: No history of alcohol / drug abuse                     Allergies:  No Known Allergies  Labs:  Results for orders placed or performed during the hospital encounter of 05/23/15 (from the past 48 hour(s))  Acetaminophen level     Status: Abnormal   Collection Time: 05/23/15  9:38 PM  Result Value Ref Range   Acetaminophen (Tylenol), Serum <10 (L) 10 - 30 ug/mL    Comment:        THERAPEUTIC CONCENTRATIONS VARY SIGNIFICANTLY. A RANGE OF 10-30 ug/mL MAY BE AN EFFECTIVE CONCENTRATION FOR MANY PATIENTS. HOWEVER, SOME ARE BEST TREATED AT CONCENTRATIONS OUTSIDE THIS RANGE. ACETAMINOPHEN CONCENTRATIONS >150 ug/mL AT 4 HOURS AFTER INGESTION AND >50 ug/mL AT 12 HOURS AFTER INGESTION ARE OFTEN ASSOCIATED WITH TOXIC REACTIONS.   CBC     Status: None   Collection Time: 05/23/15  9:38 PM  Result Value Ref Range   WBC 5.1 4.0 - 10.5 K/uL   RBC 5.01 4.22 - 5.81 MIL/uL   Hemoglobin 13.9 13.0 - 17.0 g/dL   HCT 41.7 39.0 - 52.0 %   MCV 83.2 78.0 - 100.0 fL   MCH 27.7 26.0 - 34.0 pg   MCHC 33.3 30.0 - 36.0 g/dL   RDW 14.9 11.5 - 15.5 %   Platelets 211 150 - 400 K/uL  Comprehensive metabolic panel     Status:  Abnormal   Collection Time: 05/23/15  9:38 PM  Result Value Ref Range   Sodium 140 135 - 145 mmol/L   Potassium 4.3 3.5 - 5.1 mmol/L   Chloride 106 101 - 111 mmol/L   CO2 26 22 - 32 mmol/L   Glucose, Bld 108 (H) 65 - 99 mg/dL   BUN 17 6 - 20  mg/dL   Creatinine, Ser 1.27 (H) 0.61 - 1.24 mg/dL   Calcium 9.5 8.9 - 10.3 mg/dL   Total Protein 8.0 6.5 - 8.1 g/dL   Albumin 4.4 3.5 - 5.0 g/dL   AST 22 15 - 41 U/L   ALT 15 (L) 17 - 63 U/L   Alkaline Phosphatase 72 38 - 126 U/L   Total Bilirubin 0.7 0.3 - 1.2 mg/dL   GFR calc non Af Amer 55 (L) >60 mL/min   GFR calc Af Amer >60 >60 mL/min    Comment: (NOTE) The eGFR has been calculated using the CKD EPI equation. This calculation has not been validated in all clinical situations. eGFR's persistently <60 mL/min signify possible Chronic Kidney Disease.    Anion gap 8 5 - 15  Ethanol (ETOH)     Status: None   Collection Time: 05/23/15  9:38 PM  Result Value Ref Range   Alcohol, Ethyl (B) <5 <5 mg/dL    Comment:        LOWEST DETECTABLE LIMIT FOR SERUM ALCOHOL IS 5 mg/dL FOR MEDICAL PURPOSES ONLY   Salicylate level     Status: None   Collection Time: 05/23/15  9:38 PM  Result Value Ref Range   Salicylate Lvl <5.9 2.8 - 30.0 mg/dL  Urine rapid drug screen (hosp performed)not at [facility]     Status: None   Collection Time: 05/23/15 10:15 PM  Result Value Ref Range   Opiates NONE DETECTED NONE DETECTED   Cocaine NONE DETECTED NONE DETECTED   Benzodiazepines NONE DETECTED NONE DETECTED   Amphetamines NONE DETECTED NONE DETECTED   Tetrahydrocannabinol NONE DETECTED NONE DETECTED   Barbiturates NONE DETECTED NONE DETECTED    Comment:        DRUG SCREEN FOR MEDICAL PURPOSES ONLY.  IF CONFIRMATION IS NEEDED FOR ANY PURPOSE, NOTIFY LAB WITHIN 5 DAYS.        LOWEST DETECTABLE LIMITS FOR URINE DRUG SCREEN Drug Class       Cutoff (ng/mL) Amphetamine      1000 Barbiturate      200 Benzodiazepine   741 Tricyclics       638 Opiates           300 Cocaine          300 THC              50   Urinalysis, Routine w reflex microscopic (not at [facility])     Status: Abnormal   Collection Time: 05/23/15 10:15 PM  Result Value Ref Range   Color, Urine YELLOW YELLOW   APPearance CLEAR CLEAR   Specific Gravity, Urine 1.002 (L) 1.005 - 1.030   pH 6.0 5.0 - 8.0   Glucose, UA NEGATIVE NEGATIVE mg/dL   Hgb urine dipstick SMALL (A) NEGATIVE   Bilirubin Urine NEGATIVE NEGATIVE   Ketones, ur NEGATIVE NEGATIVE mg/dL   Protein, ur NEGATIVE NEGATIVE mg/dL   Urobilinogen, UA 0.2 0.0 - 1.0 mg/dL   Nitrite NEGATIVE NEGATIVE   Leukocytes, UA NEGATIVE NEGATIVE  Urine microscopic-add on     Status: None   Collection Time: 05/23/15 10:15 PM  Result Value Ref Range   RBC / HPF 0-2 <3 RBC/hpf    Vitals: Blood pressure 124/61, pulse 60, temperature 97.8 F (36.6 C), temperature source Oral, resp. rate 18, SpO2 99 %.  Risk to Self: Suicidal Ideation: No Suicidal Intent: No Is patient at risk for suicide?: No Suicidal Plan?: No Access to Means: No What has been your use  of drugs/alcohol within the last 12 months?: No alcohol or drug use reported.  How many times?: 1 Other Self Harm Risks: No other self harm risk identified at this time.  Triggers for Past Attempts: Hallucinations Intentional Self Injurious Behavior: None Risk to Others: Homicidal Ideation: No Thoughts of Harm to Others: No Current Homicidal Intent: No Current Homicidal Plan: No Access to Homicidal Means: No Identified Victim: NA History of harm to others?: No Assessment of Violence: None Noted Violent Behavior Description: No violent behaviors observed. Pt is calm and cooperative at this time.  Does patient have access to weapons?: No Does patient have a court date: No Prior Inpatient Therapy: Prior Inpatient Therapy: Yes Prior Therapy Dates: 2014 Prior Therapy Facilty/Provider(s): [facility] Reason for Treatment: Suicide attempt Prior Outpatient Therapy:  Prior Outpatient Therapy: Yes Prior Therapy Dates: 2014-present  Prior Therapy Facilty/Provider(s): Dr. Zeb Comfort  Reason for Treatment: Depression  Does patient have an ACCT team?: No Does patient have Intensive In-House Services?  : No Does patient have [facility] services? : No Does patient have P4CC services?: No  Current Facility-Administered Medications  Medication Dose Route Frequency Provider Last Rate Last Dose  . alum & mag hydroxide-simeth (MAALOX/MYLANTA) 200-200-20 MG/5ML suspension 30 mL  30 mL Oral PRN Alfonzo Beers, MD      . aspirin chewable tablet 81 mg  81 mg Oral Daily Alfonzo Beers, MD   81 mg at 05/24/15 0853  . clonazePAM (KLONOPIN) tablet 0.25 mg  0.25 mg Oral QHS Alfonzo Beers, MD   0.25 mg at 05/24/15 0015  . cyanocobalamin ((VITAMIN B-12)) injection 1,000 mcg  1,000 mcg Intramuscular Q30 days Lafayette Dragon, MD   1,000 mcg at 02/04/13 0844  . lamoTRIgine (LAMICTAL) tablet 25 mg  25 mg Oral Daily Alfonzo Beers, MD   25 mg at 05/24/15 0853  . LORazepam (ATIVAN) tablet 1 mg  1 mg Oral Q8H PRN Alfonzo Beers, MD      . nicotine (NICODERM CQ - dosed in mg/24 hours) patch 21 mg  21 mg Transdermal Daily Alfonzo Beers, MD   21 mg at 05/24/15 0855  . ondansetron (ZOFRAN) tablet 4 mg  4 mg Oral Q8H PRN Alfonzo Beers, MD      . risperiDONE (RISPERDAL) tablet 0.5 mg  0.5 mg Oral QHS Alfonzo Beers, MD   0.5 mg at 05/24/15 0016  . sertraline (ZOLOFT) tablet 50 mg  50 mg Oral TID Alfonzo Beers, MD   50 mg at 05/24/15 7096   Current Outpatient Prescriptions  Medication Sig Dispense Refill  . aspirin 81 MG tablet Take 81 mg by mouth daily.    . clonazePAM (KLONOPIN) 0.5 MG tablet Take 0.25 tablets by mouth at bedtime.     . lamoTRIgine (LAMICTAL) 25 MG tablet Take 25 mg by mouth daily.     . risperiDONE (RISPERDAL) 0.5 MG tablet Take 2 tablets (1 mg total) by mouth at bedtime. (Patient taking differently: Take 0.5 mg by mouth at bedtime. ) 30 tablet 0  . sertraline (ZOLOFT) 50 MG tablet  Take 50 mg by mouth 3 (three) times daily.   3    Musculoskeletal: Strength & Muscle Tone: within normal limits Gait & Station: normal Patient leans: N/A  Psychiatric Specialty Exam: Physical Exam  Review of Systems  Constitutional: Negative.   HENT: Negative.   Eyes: Negative.   Respiratory: Negative.   Cardiovascular: Negative.   Gastrointestinal: Negative.   Genitourinary: Negative.   Musculoskeletal: Negative.   Skin: Negative.  Neurological: Negative.   Endo/Heme/Allergies: Negative.     Blood pressure 124/61, pulse 60, temperature 97.8 F (36.6 C), temperature source Oral, resp. rate 18, SpO2 99 %.There is no weight on file to calculate BMI.  General Appearance: Casual and Fairly Groomed  Eye Contact::  Good  Speech:  Clear and Coherent and Normal Rate  Volume:  Normal  Mood:  Anxious and Depressed  Affect:  Congruent and Depressed  Thought Process:  Coherent, Goal Directed and Intact  Orientation:  Full (Time, Place, and Person)  Thought Content:  WDL  Suicidal Thoughts:  No  Homicidal Thoughts:  No  Memory:  Immediate;   Good Recent;   Good Remote;   Good  Judgement:  Fair  Insight:  Fair  Psychomotor Activity:  Normal  Concentration:  Good  Recall:  NA  Fund of Knowledge:Good  Language: Good  Akathisia:  NA  Handed:  Right  AIMS (if indicated):     Assets:  Desire for Improvement  ADL's:  Intact  Cognition: WNL  Sleep:      Medical Decision Making: Review of Psycho-Social Stressors (1)  Treatment Plan Summary: Daily contact with patient to assess and evaluate symptoms and progress in treatment and Medication management  Plan:  We will start Lamictal 25 mg po daily for mood stabilization, Ativan 1 mg po every 8 hours as needed for agitation, Risperdal 0.5 mg po at bed time for mood control, Klonopin 0.5 mg po at bed time for sleep, Sertraline 50 mg po TID for depression.  Order Chest xray  And EKG for  Preadmission testing.  Disposition: Admit to  Gero-psychiatric unit  Delfin Gant   PMHNP-BC 05/24/2015 3:05 PM Patient seen face-to-face for psychiatric evaluation, chart reviewed and case discussed with the physician extender and developed treatment plan. Reviewed the information documented and agree with the treatment plan. Corena Pilgrim, MD

## 2015-05-25 DIAGNOSIS — F329 Major depressive disorder, single episode, unspecified: Secondary | ICD-10-CM | POA: Diagnosis not present

## 2015-05-25 DIAGNOSIS — F0390 Unspecified dementia without behavioral disturbance: Secondary | ICD-10-CM | POA: Insufficient documentation

## 2015-05-25 NOTE — BH Assessment (Signed)
[facility] Assessment Progress Note  At 13:52 Shirlee Limerick from [facility] called.  Pt has been accepted to their facility by Geanie Kenning, MD.  Please call report to [phone].  Waylan Boga, NP concurs with this decision.  Pt is under IVC and is to be transported via [employer].  Jalene Mullet, MA Triage Specialist [phone]

## 2015-05-25 NOTE — Consult Note (Signed)
[facility] Consult   Reason for Consult:  Major depression, agitation, aggression Referring Physician:  EDP Patient Identification: Anthony Skinner MRN:  [id number] Principal Diagnosis: Major depressive disorder without psychotic features Diagnosis:   Patient Active Problem List   Diagnosis Date Noted  . Major depressive disorder without psychotic features [F32.9] 05/24/2015    Priority: High  . Adjustment disorder with disturbance of emotion [F43.29] 04/23/2015    Priority: High  . Aggressive behavior [F60.89]     Priority: High  . Chest pain [R07.9] 09/15/2013  . Coronary atherosclerosis of native coronary artery [I25.10] 09/15/2013  . Hypokalemia [E87.6] 08/28/2013  . Acute respiratory failure [J96.00] 08/25/2013  . Altered mental status [R41.82] 08/25/2013  . Anoxic brain injury [G93.1] 08/25/2013  . HTN (hypertension) [I10] 08/25/2013  . Suicide attempt [T14.91] 08/25/2013  . Vitamin B 12 deficiency [E53.8] 07/15/2013  . Unspecified hereditary and idiopathic peripheral neuropathy [G60.9] 07/15/2013  . Memory loss [R41.3] 07/15/2013    Total Time spent with patient: 30 minutes  Subjective:   Anthony Skinner is a 74 y.o. male patient admitted with  Major depression, agitation, aggression  HPI:  The patient remains aggressive at times with his wife who is trying to take care of him despite her cardiac issues.  His behaviors are increasing her cardiac problems.  He is agreeable to inpatient psychiatry for stabilization of mood.  Depression along with aggression, present.  HPI Elements:   Location:  Major depressive disorder without Psychosis, anger, agiatation. Quality:  severe. Severity:  severe. Timing:  acute. Duration:  5 month. Context:  brought in by his wife for agiataion and aggression towards her..  Past Medical History:  Past Medical History  Diagnosis Date  . Iron deficiency anemia   . Tubular adenoma of colon 2012  . Gastropathy 2012     reactive  . PUD (peptic ulcer disease)   . Prostate cancer     a. 09/2008 s/p prostatectomy.  . CAD (coronary artery disease)     a. reported h/o MI in the 57's;  b. 04/2000 Cath: LM nl, LAD 40p, D1 small, nl, RI nl, LCX nl, RCA nl.  . Hyperlipidemia   . Vertebral artery stenosis     a. 09/2010 s/p L vertebral stenting 09/2010.  Marland Kitchen GERD (gastroesophageal reflux disease)   . Hiatal hernia   . Recurrent spontaneous pneumothorax     a. s/p L lobectomy in 1966.  Marland Kitchen DJD (degenerative joint disease)   . Hypertension   . Upper GI bleed     a. 2012  . Fatty liver   . Hypertension   . Syncope     a. in setting of GIB in 2012, presumed to be orthostatic.  . Suicide attempt     a. 08/2013 attempt by hanging with subsequent resp failure  . Myocardial infarction     " BACK IN THE 90'S"  . Dementia   . Shortness of breath     Past Surgical History  Procedure Laterality Date  . Prostatectomy    . Vertebral artery stent    . Lung removal, partial  1960s    left  . Cataract extraction Right   . Cardiac surgery    . Cardiac catheterization  09/15/2013  . Left heart catheterization with coronary angiogram N/A 09/15/2013    Procedure: LEFT HEART CATHETERIZATION WITH CORONARY ANGIOGRAM;  Surgeon: Burnell Blanks, MD;  Location: [facility] CATH LAB;  Service: Cardiovascular;  Laterality: N/A;   Family History:  Family  History  Problem Relation Age of Onset  . Heart attack Father   . Alzheimer's disease Mother   . Prostate cancer Brother    Social History:  History  Alcohol Use No    Comment: Hx heavy EtOH use but quit 2011     History  Drug Use No    History   Social History  . Marital Status: Married    Spouse Name: Anthony Skinner  . Number of Children: 0  . Years of Education: 12th   Occupational History  . retired    Social History Main Topics  . Smoking status: Former Smoker -- 1.00 packs/day for 40 years    Types: Cigarettes    Quit date: 08/27/2002  . Smokeless tobacco: Never  Used  . Alcohol Use: No     Comment: Hx heavy EtOH use but quit 2011  . Drug Use: No  . Sexual Activity: Not on file   Other Topics Concern  . None   Social History Narrative   Lives in [place] with wife.  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   Caffeine Use: 4 cups daily       Additional Social History:    Pain Medications: Denies abuse Prescriptions: Denies abuse History of alcohol / drug use?: No history of alcohol / drug abuse                     Allergies:  No Known Allergies  Labs:  Results for orders placed or performed during the hospital encounter of 05/23/15 (from the past 48 hour(s))  Acetaminophen level     Status: Abnormal   Collection Time: 05/23/15  9:38 PM  Result Value Ref Range   Acetaminophen (Tylenol), Serum <10 (L) 10 - 30 ug/mL    Comment:        THERAPEUTIC CONCENTRATIONS VARY SIGNIFICANTLY. A RANGE OF 10-30 ug/mL MAY BE AN EFFECTIVE CONCENTRATION FOR MANY PATIENTS. HOWEVER, SOME ARE BEST TREATED AT CONCENTRATIONS OUTSIDE THIS RANGE. ACETAMINOPHEN CONCENTRATIONS >150 ug/mL AT 4 HOURS AFTER INGESTION AND >50 ug/mL AT 12 HOURS AFTER INGESTION ARE OFTEN ASSOCIATED WITH TOXIC REACTIONS.   CBC     Status: None   Collection Time: 05/23/15  9:38 PM  Result Value Ref Range   WBC 5.1 4.0 - 10.5 K/uL   RBC 5.01 4.22 - 5.81 MIL/uL   Hemoglobin 13.9 13.0 - 17.0 g/dL   HCT 41.7 39.0 - 52.0 %   MCV 83.2 78.0 - 100.0 fL   MCH 27.7 26.0 - 34.0 pg   MCHC 33.3 30.0 - 36.0 g/dL   RDW 14.9 11.5 - 15.5 %   Platelets 211 150 - 400 K/uL  Comprehensive metabolic panel     Status: Abnormal   Collection Time: 05/23/15  9:38 PM  Result Value Ref Range   Sodium 140 135 - 145 mmol/L   Potassium 4.3 3.5 - 5.1 mmol/L   Chloride 106 101 - 111 mmol/L   CO2 26 22 - 32 mmol/L   Glucose, Bld 108 (H) 65 - 99 mg/dL   BUN 17 6 - 20 mg/dL   Creatinine, Ser 1.27 (H) 0.61 - 1.24 mg/dL   Calcium 9.5 8.9 - 10.3 mg/dL   Total Protein 8.0 6.5 - 8.1 g/dL    Albumin 4.4 3.5 - 5.0 g/dL   AST 22 15 - 41 U/L   ALT 15 (L) 17 - 63 U/L   Alkaline Phosphatase 72 38 - 126 U/L   Total Bilirubin 0.7 0.3 - 1.2  mg/dL   GFR calc non Af Amer 55 (L) >60 mL/min   GFR calc Af Amer >60 >60 mL/min    Comment: (NOTE) The eGFR has been calculated using the CKD EPI equation. This calculation has not been validated in all clinical situations. eGFR's persistently <60 mL/min signify possible Chronic Kidney Disease.    Anion gap 8 5 - 15  Ethanol (ETOH)     Status: None   Collection Time: 05/23/15  9:38 PM  Result Value Ref Range   Alcohol, Ethyl (B) <5 <5 mg/dL    Comment:        LOWEST DETECTABLE LIMIT FOR SERUM ALCOHOL IS 5 mg/dL FOR MEDICAL PURPOSES ONLY   Salicylate level     Status: None   Collection Time: 05/23/15  9:38 PM  Result Value Ref Range   Salicylate Lvl <6.8 2.8 - 30.0 mg/dL  Urine rapid drug screen (hosp performed)not at [facility]     Status: None   Collection Time: 05/23/15 10:15 PM  Result Value Ref Range   Opiates NONE DETECTED NONE DETECTED   Cocaine NONE DETECTED NONE DETECTED   Benzodiazepines NONE DETECTED NONE DETECTED   Amphetamines NONE DETECTED NONE DETECTED   Tetrahydrocannabinol NONE DETECTED NONE DETECTED   Barbiturates NONE DETECTED NONE DETECTED    Comment:        DRUG SCREEN FOR MEDICAL PURPOSES ONLY.  IF CONFIRMATION IS NEEDED FOR ANY PURPOSE, NOTIFY LAB WITHIN 5 DAYS.        LOWEST DETECTABLE LIMITS FOR URINE DRUG SCREEN Drug Class       Cutoff (ng/mL) Amphetamine      1000 Barbiturate      200 Benzodiazepine   127 Tricyclics       517 Opiates          300 Cocaine          300 THC              50   Urinalysis, Routine w reflex microscopic (not at [facility])     Status: Abnormal   Collection Time: 05/23/15 10:15 PM  Result Value Ref Range   Color, Urine YELLOW YELLOW   APPearance CLEAR CLEAR   Specific Gravity, Urine 1.002 (L) 1.005 - 1.030   pH 6.0 5.0 - 8.0   Glucose, UA NEGATIVE NEGATIVE mg/dL   Hgb  urine dipstick SMALL (A) NEGATIVE   Bilirubin Urine NEGATIVE NEGATIVE   Ketones, ur NEGATIVE NEGATIVE mg/dL   Protein, ur NEGATIVE NEGATIVE mg/dL   Urobilinogen, UA 0.2 0.0 - 1.0 mg/dL   Nitrite NEGATIVE NEGATIVE   Leukocytes, UA NEGATIVE NEGATIVE  Urine microscopic-add on     Status: None   Collection Time: 05/23/15 10:15 PM  Result Value Ref Range   RBC / HPF 0-2 <3 RBC/hpf    Vitals: Blood pressure 114/54, pulse 58, temperature 98.1 F (36.7 C), temperature source Oral, resp. rate 18, SpO2 99 %.  Risk to Self: Suicidal Ideation: No Suicidal Intent: No Is patient at risk for suicide?: No Suicidal Plan?: No Access to Means: No What has been your use of drugs/alcohol within the last 12 months?: No alcohol or drug use reported.  How many times?: 1 Other Self Harm Risks: No other self harm risk identified at this time.  Triggers for Past Attempts: Hallucinations Intentional Self Injurious Behavior: None Risk to Others: Homicidal Ideation: No Thoughts of Harm to Others: No Current Homicidal Intent: No Current Homicidal Plan: No Access to Homicidal Means: No Identified Victim: NA  History of harm to others?: No Assessment of Violence: None Noted Violent Behavior Description: No violent behaviors observed. Pt is calm and cooperative at this time.  Does patient have access to weapons?: No Does patient have a court date: No Prior Inpatient Therapy: Prior Inpatient Therapy: Yes Prior Therapy Dates: 2014 Prior Therapy Facilty/Provider(s): [facility] Reason for Treatment: Suicide attempt Prior Outpatient Therapy: Prior Outpatient Therapy: Yes Prior Therapy Dates: 2014-present  Prior Therapy Facilty/Provider(s): Dr. Zeb Comfort  Reason for Treatment: Depression  Does patient have an ACCT team?: No Does patient have Intensive In-House Services?  : No Does patient have [facility] services? : No Does patient have P4CC services?: No  Current Facility-Administered Medications   Medication Dose Route Frequency Provider Last Rate Last Dose  . alum & mag hydroxide-simeth (MAALOX/MYLANTA) 200-200-20 MG/5ML suspension 30 mL  30 mL Oral PRN Alfonzo Beers, MD      . aspirin chewable tablet 81 mg  81 mg Oral Daily Alfonzo Beers, MD   81 mg at 05/25/15 0931  . clonazePAM (KLONOPIN) tablet 0.25 mg  0.25 mg Oral QHS Alfonzo Beers, MD   0.25 mg at 05/24/15 2153  . cyanocobalamin ((VITAMIN B-12)) injection 1,000 mcg  1,000 mcg Intramuscular Q30 days Lafayette Dragon, MD   1,000 mcg at 02/04/13 0844  . lamoTRIgine (LAMICTAL) tablet 25 mg  25 mg Oral Daily Alfonzo Beers, MD   25 mg at 05/25/15 0931  . LORazepam (ATIVAN) tablet 1 mg  1 mg Oral Q8H PRN Alfonzo Beers, MD      . nicotine (NICODERM CQ - dosed in mg/24 hours) patch 21 mg  21 mg Transdermal Daily Alfonzo Beers, MD   21 mg at 05/24/15 0855  . ondansetron (ZOFRAN) tablet 4 mg  4 mg Oral Q8H PRN Alfonzo Beers, MD      . risperiDONE (RISPERDAL) tablet 0.5 mg  0.5 mg Oral QHS Alfonzo Beers, MD   0.5 mg at 05/24/15 2153  . sertraline (ZOLOFT) tablet 50 mg  50 mg Oral TID Alfonzo Beers, MD   50 mg at 05/25/15 6812   Current Outpatient Prescriptions  Medication Sig Dispense Refill  . aspirin 81 MG tablet Take 81 mg by mouth daily.    . clonazePAM (KLONOPIN) 0.5 MG tablet Take 0.25 tablets by mouth at bedtime.     . lamoTRIgine (LAMICTAL) 25 MG tablet Take 25 mg by mouth daily.     . risperiDONE (RISPERDAL) 0.5 MG tablet Take 2 tablets (1 mg total) by mouth at bedtime. (Patient taking differently: Take 0.5 mg by mouth at bedtime. ) 30 tablet 0  . sertraline (ZOLOFT) 50 MG tablet Take 50 mg by mouth 3 (three) times daily.   3    Musculoskeletal: Strength & Muscle Tone: within normal limits Gait & Station: normal Patient leans: N/A  Psychiatric Specialty Exam: Physical Exam  Review of Systems  Constitutional: Negative.   HENT: Negative.   Eyes: Negative.   Respiratory: Negative.   Cardiovascular: Negative.    Gastrointestinal: Negative.   Genitourinary: Negative.   Musculoskeletal: Negative.   Skin: Negative.   Neurological: Negative.   Endo/Heme/Allergies: Negative.   Psychiatric/Behavioral: Positive for depression and memory loss.       Aggression    Blood pressure 114/54, pulse 58, temperature 98.1 F (36.7 C), temperature source Oral, resp. rate 18, SpO2 99 %.There is no weight on file to calculate BMI.  General Appearance: Casual and Fairly Groomed  Eye Contact::  Good  Speech:  Clear and Coherent  and Normal Rate  Volume:  Normal  Mood:  Anxious and Depressed  Affect:  Congruent and Depressed  Thought Process:  Coherent, Goal Directed and Intact  Orientation:  Full (Time, Place, and Person)  Thought Content:  WDL  Suicidal Thoughts:  No  Homicidal Thoughts:  No  Memory:  Immediate;   Good Recent;   Good Remote;   Good  Judgement:  Fair  Insight:  Fair  Psychomotor Activity:  Normal  Concentration:  Good  Recall:  NA  Fund of Knowledge:Good  Language: Good  Akathisia:  NA  Handed:  Right  AIMS (if indicated):     Assets:  Desire for Improvement  ADL's:  Intact  Cognition: WNL  Sleep:      Medical Decision Making: Review of Psycho-Social Stressors (1)  Treatment Plan Summary: Daily contact with patient to assess and evaluate symptoms and progress in treatment and Medication management  Plan:  We will start Lamictal 25 mg po daily for mood stabilization, Ativan 1 mg po every 8 hours as needed for agitation, Risperdal 0.5 mg po at bed time for mood control, Klonopin 0.5 mg po at bed time for sleep, Sertraline 50 mg po TID for depression.  Order Chest xray  And EKG for  Preadmission testing.  Disposition: Admit to Gero-psychiatric unit  Waylan Boga   PMHNP-BC 05/25/2015 11:01 AM Patient seen face-to-face for psychiatric evaluation, chart reviewed and case discussed with the physician extender and developed treatment plan. Reviewed the information documented and agree  with the treatment plan. Corena Pilgrim, MD

## 2015-05-25 NOTE — ED Notes (Signed)
Pts wife Gracey Beg took 1 pt belonging bag home.

## 2015-05-25 NOTE — Progress Notes (Addendum)
CSW met with pt and pt wife at bedside. Pt gave permission to speak with pt and wife about disposition and recommendations. Pt was informed of admission to [facility]. Pt and pt wife shared that patient had been their previouslly and patient did not release information to wife, and a counselor had told wife that patient had been planning for a month of how he was going to end his life. Pt was upset about this. CSW and pt discussed requesting a different provider when they're. Patient also expressed that another dementia patient was in his bed when he woke up. Pt stated, "I know she didn't know, and nothing happened, but it was disturbing, she wasn't my wife." CSW provided supportive counseling. Patient states, "I want to go though to get better and to be able to return home."  CSW provided outpatient referrals for out patient psych and neurological.   Belia Heman, [facility] [employer]  [facility] [phone]

## 2015-06-12 ENCOUNTER — Emergency Department (HOSPITAL_COMMUNITY)
Admission: EM | Admit: 2015-06-12 | Discharge: 2015-06-13 | Disposition: A | Discharge status: Home / Self Care | Payer: Medicare Other | Attending: Emergency Medicine | Admitting: Emergency Medicine

## 2015-06-12 ENCOUNTER — Encounter (HOSPITAL_COMMUNITY): Payer: Self-pay

## 2015-06-12 DIAGNOSIS — Z8639 Personal history of other endocrine, nutritional and metabolic disease: Secondary | ICD-10-CM | POA: Diagnosis not present

## 2015-06-12 DIAGNOSIS — Z862 Personal history of diseases of the blood and blood-forming organs and certain disorders involving the immune mechanism: Secondary | ICD-10-CM | POA: Insufficient documentation

## 2015-06-12 DIAGNOSIS — Z7982 Long term (current) use of aspirin: Secondary | ICD-10-CM | POA: Diagnosis not present

## 2015-06-12 DIAGNOSIS — Z8546 Personal history of malignant neoplasm of prostate: Secondary | ICD-10-CM | POA: Diagnosis not present

## 2015-06-12 DIAGNOSIS — F919 Conduct disorder, unspecified: Secondary | ICD-10-CM | POA: Insufficient documentation

## 2015-06-12 DIAGNOSIS — Z8719 Personal history of other diseases of the digestive system: Secondary | ICD-10-CM | POA: Diagnosis not present

## 2015-06-12 DIAGNOSIS — I10 Essential (primary) hypertension: Secondary | ICD-10-CM | POA: Insufficient documentation

## 2015-06-12 DIAGNOSIS — IMO0002 Reserved for concepts with insufficient information to code with codable children: Secondary | ICD-10-CM

## 2015-06-12 DIAGNOSIS — Z008 Encounter for other general examination: Secondary | ICD-10-CM | POA: Diagnosis present

## 2015-06-12 DIAGNOSIS — Z87891 Personal history of nicotine dependence: Secondary | ICD-10-CM | POA: Insufficient documentation

## 2015-06-12 DIAGNOSIS — Z8711 Personal history of peptic ulcer disease: Secondary | ICD-10-CM | POA: Insufficient documentation

## 2015-06-12 DIAGNOSIS — F0390 Unspecified dementia without behavioral disturbance: Secondary | ICD-10-CM | POA: Diagnosis not present

## 2015-06-12 DIAGNOSIS — Z9889 Other specified postprocedural states: Secondary | ICD-10-CM | POA: Diagnosis not present

## 2015-06-12 DIAGNOSIS — I252 Old myocardial infarction: Secondary | ICD-10-CM | POA: Insufficient documentation

## 2015-06-12 DIAGNOSIS — F329 Major depressive disorder, single episode, unspecified: Secondary | ICD-10-CM | POA: Diagnosis present

## 2015-06-12 DIAGNOSIS — I2510 Atherosclerotic heart disease of native coronary artery without angina pectoris: Secondary | ICD-10-CM | POA: Diagnosis not present

## 2015-06-12 DIAGNOSIS — F332 Major depressive disorder, recurrent severe without psychotic features: Secondary | ICD-10-CM | POA: Diagnosis not present

## 2015-06-12 LAB — RAPID URINE DRUG SCREEN, HOSP PERFORMED
Amphetamines: NOT DETECTED
Barbiturates: NOT DETECTED
Benzodiazepines: NOT DETECTED
Cocaine: NOT DETECTED
Opiates: NOT DETECTED
Tetrahydrocannabinol: NOT DETECTED

## 2015-06-12 LAB — CBC
HCT: 40.9 % (ref 39.0–52.0)
Hemoglobin: 13.3 g/dL (ref 13.0–17.0)
MCH: 27.4 pg (ref 26.0–34.0)
MCHC: 32.5 g/dL (ref 30.0–36.0)
MCV: 84.3 fL (ref 78.0–100.0)
Platelets: 257 10*3/uL (ref 150–400)
RBC: 4.85 MIL/uL (ref 4.22–5.81)
RDW: 15.1 % (ref 11.5–15.5)
WBC: 6.7 10*3/uL (ref 4.0–10.5)

## 2015-06-12 LAB — COMPREHENSIVE METABOLIC PANEL
ALT: 15 U/L — ABNORMAL LOW (ref 17–63)
AST: 21 U/L (ref 15–41)
Albumin: 4.1 g/dL (ref 3.5–5.0)
Alkaline Phosphatase: 62 U/L (ref 38–126)
Anion gap: 7 (ref 5–15)
BUN: 19 mg/dL (ref 6–20)
CO2: 28 mmol/L (ref 22–32)
Calcium: 9 mg/dL (ref 8.9–10.3)
Chloride: 103 mmol/L (ref 101–111)
Creatinine, Ser: 1.47 mg/dL — ABNORMAL HIGH (ref 0.61–1.24)
GFR calc Af Amer: 53 mL/min — ABNORMAL LOW (ref >60)
GFR calc non Af Amer: 46 mL/min — ABNORMAL LOW (ref >60)
Glucose, Bld: 108 mg/dL — ABNORMAL HIGH (ref 65–99)
Potassium: 4 mmol/L (ref 3.5–5.1)
Sodium: 138 mmol/L (ref 135–145)
Total Bilirubin: 0.4 mg/dL (ref 0.3–1.2)
Total Protein: 7.6 g/dL (ref 6.5–8.1)

## 2015-06-12 LAB — ETHANOL: Alcohol, Ethyl (B): <5 mg/dL (ref <5)

## 2015-06-12 LAB — SALICYLATE LEVEL: Salicylate Lvl: <4 mg/dL (ref 2.8–30.0)

## 2015-06-12 LAB — ACETAMINOPHEN LEVEL: Acetaminophen (Tylenol), Serum: <10 ug/mL — ABNORMAL LOW (ref 10–30)

## 2015-06-12 MED ORDER — NICOTINE 21 MG/24HR TD PT24: 21.0000 mg | MEDICATED_PATCH | Freq: Every day | TRANSDERMAL | Status: DC | PRN

## 2015-06-12 MED ORDER — IBUPROFEN 200 MG PO TABS: 600.0000 mg | ORAL_TABLET | Freq: Three times a day (TID) | ORAL | Status: DC | PRN

## 2015-06-12 MED ORDER — ZOLPIDEM TARTRATE 5 MG PO TABS
5.0000 mg | ORAL_TABLET | Freq: Every evening | ORAL | Status: DC | PRN
  Administered 2015-06-12: 5 mg via ORAL
  Filled 2015-06-12: qty 1

## 2015-06-12 MED ORDER — ALUM & MAG HYDROXIDE-SIMETH 200-200-20 MG/5ML PO SUSP: 30.0000 mL | ORAL | Status: DC | PRN

## 2015-06-12 MED ORDER — LAMOTRIGINE 25 MG PO TABS
25.0000 mg | ORAL_TABLET | Freq: Three times a day (TID) | ORAL | Status: DC
  Administered 2015-06-12 – 2015-06-13 (×2): 25 mg via ORAL
  Filled 2015-06-12 (×4): qty 1

## 2015-06-12 MED ORDER — MIRTAZAPINE 30 MG PO TABS
30.0000 mg | ORAL_TABLET | Freq: Every day | ORAL | Status: DC
  Administered 2015-06-12: 30 mg via ORAL
  Filled 2015-06-12: qty 1

## 2015-06-12 MED ORDER — ACETAMINOPHEN 325 MG PO TABS: 650.0000 mg | ORAL_TABLET | ORAL | Status: DC | PRN

## 2015-06-12 MED ORDER — MEMANTINE HCL 5 MG PO TABS
5.0000 mg | ORAL_TABLET | Freq: Every morning | ORAL | Status: DC
  Administered 2015-06-13: 5 mg via ORAL
  Filled 2015-06-12: qty 1

## 2015-06-12 MED ORDER — ONDANSETRON HCL 4 MG PO TABS: 4.0000 mg | ORAL_TABLET | Freq: Three times a day (TID) | ORAL | Status: DC | PRN

## 2015-06-12 MED ORDER — RISPERIDONE 0.5 MG PO TABS
0.5000 mg | ORAL_TABLET | Freq: Every day | ORAL | Status: DC
  Administered 2015-06-12: 0.5 mg via ORAL
  Filled 2015-06-12: qty 1

## 2015-06-12 NOTE — BH Assessment (Addendum)
Spoke with Dr. Eulis Foster, and reviewed recent [facility] assessments prior to initiating assessment. Pt has been assessed twice in the past two months. He has hx of past suicide attempt in 2014 and was recently inpt at [facility] for 5 days. Pt reports his wife is petitioning for IVC following a verbal altercation tonight. In past assessments pt has admitted to yelling at his wife and she has reported fears that he may hurt her and then not remember it. Dr. Eulis Foster does not feel pt will meet criteria to be committed. Assessment to commence shortly. Pt reports he has the initial stages of Alzheimer's and is followed bi-monthly by Dr. Zeb Comfort.    Anthony Skinner, [facility] Triage Specialist 06/12/2015 10:53 PM,

## 2015-06-12 NOTE — ED Notes (Signed)
Patient denies any thoughts of SI/HI. Patient verbalizes to me that he does have a history of suicide attempt 2 years ago. Patient states at that time he attempted to hang himself and his wife found him and had to cut him down, Patient states he never wants to put her through anything like that again and has not had those thoughts since. Patient states he and his wife got into argument about his medications and that is why he is here. Patient alert and cooperative.

## 2015-06-12 NOTE — ED Provider Notes (Signed)
CSN: [id number]     Arrival date & time 06/12/15  2052 History   First MD Initiated Contact with Patient 06/12/15 2154     Chief Complaint  Patient presents with  . Medical Clearance     (Consider location/radiation/quality/duration/timing/severity/associated sxs/prior Treatment) HPI  Anthony Skinner is a 74 y.o. male presents for evaluation of violent behavior, described as arguing with his wife. Apparently she is taking out papers on him. He had a similar admission to this ED, 3 weeks ago and was transferred to a geriatric psychiatric facility. He stayed there 5 days. States he did okay back at home for a couple weeks, but tonight he was arguing with his wife about something, and then she called the police to bring him here. Patient cannot recall exactly what happened. He denies fever, chills, nausea, vomiting. He states that he has been eating well, but sometimes forgets to eat. There are no other known modifying factors.    Past Medical History  Diagnosis Date  . Iron deficiency anemia   . Tubular adenoma of colon 2012  . Gastropathy 2012    reactive  . PUD (peptic ulcer disease)   . Prostate cancer     a. 09/2008 s/p prostatectomy.  . CAD (coronary artery disease)     a. reported h/o MI in the 48's;  b. 04/2000 Cath: LM nl, LAD 40p, D1 small, nl, RI nl, LCX nl, RCA nl.  . Hyperlipidemia   . Vertebral artery stenosis     a. 09/2010 s/p L vertebral stenting 09/2010.  Marland Kitchen GERD (gastroesophageal reflux disease)   . Hiatal hernia   . Recurrent spontaneous pneumothorax     a. s/p L lobectomy in 1966.  Marland Kitchen DJD (degenerative joint disease)   . Hypertension   . Upper GI bleed     a. 2012  . Fatty liver   . Hypertension   . Syncope     a. in setting of GIB in 2012, presumed to be orthostatic.  . Suicide attempt     a. 08/2013 attempt by hanging with subsequent resp failure  . Myocardial infarction     " BACK IN THE 90'S"  . Dementia   . Shortness of breath    Past Surgical  History  Procedure Laterality Date  . Prostatectomy    . Vertebral artery stent    . Lung removal, partial  1960s    left  . Cataract extraction Right   . Cardiac surgery    . Cardiac catheterization  09/15/2013  . Left heart catheterization with coronary angiogram N/A 09/15/2013    Procedure: LEFT HEART CATHETERIZATION WITH CORONARY ANGIOGRAM;  Surgeon: Burnell Blanks, MD;  Location: [facility] CATH LAB;  Service: Cardiovascular;  Laterality: N/A;   Family History  Problem Relation Age of Onset  . Heart attack Father   . Alzheimer's disease Mother   . Prostate cancer Brother    History  Substance Use Topics  . Smoking status: Former Smoker -- 1.00 packs/day for 40 years    Types: Cigarettes    Quit date: 08/27/2002  . Smokeless tobacco: Never Used  . Alcohol Use: No     Comment: Hx heavy EtOH use but quit 2011    Review of Systems  All other systems reviewed and are negative.     Allergies  Review of patient's allergies indicates no known allergies.  Home Medications   Prior to Admission medications   Medication Sig Start Date End Date Taking? Authorizing Provider  aspirin 81 MG tablet Take 81 mg by mouth daily.    Historical Provider, MD  clonazePAM (KLONOPIN) 0.5 MG tablet Take 0.25 tablets by mouth at bedtime.  12/06/14   Historical Provider, MD  lamoTRIgine (LAMICTAL) 25 MG tablet Take 25 mg by mouth daily.     Historical Provider, MD  risperiDONE (RISPERDAL) 0.5 MG tablet Take 2 tablets (1 mg total) by mouth at bedtime. Patient taking differently: Take 0.5 mg by mouth at bedtime.  09/29/13   Noland Fordyce, PA-C  sertraline (ZOLOFT) 50 MG tablet Take 50 mg by mouth 3 (three) times daily.  11/26/14   Historical Provider, MD   BP 158/68 mmHg  Pulse 81  Temp(Src) 98 F (36.7 C) (Oral)  Resp 18  SpO2 97% Physical Exam  Constitutional: He is oriented to person, place, and time. He appears well-developed and well-nourished. No distress.  HENT:  Head:  Normocephalic and atraumatic.  Right Ear: External ear normal.  Left Ear: External ear normal.  Eyes: Conjunctivae and EOM are normal. Pupils are equal, round, and reactive to light.  Neck: Normal range of motion and phonation normal. Neck supple.  Cardiovascular: Normal rate, regular rhythm and normal heart sounds.   Pulmonary/Chest: Effort normal and breath sounds normal. He exhibits no bony tenderness.  Abdominal: Soft. There is no tenderness.  Musculoskeletal: Normal range of motion.  Neurological: He is alert and oriented to person, place, and time. No cranial nerve deficit or sensory deficit. He exhibits normal muscle tone. Coordination normal.  He is lucid and cooperative. No evident acute psychosis. Some mild memory lapses present.  Skin: Skin is warm, dry and intact.  Psychiatric: He has a normal mood and affect. His behavior is normal.  Nursing note and vitals reviewed.   ED Course  Procedures (including critical care time)  Reported aggressive behavior and altered mental status, however, patient is fairly lucid in the ED. We will await anticipated IVC paperwork, and make a decision on upholding it.  Medications - No data to display  Patient Vitals for the past 24 hrs:  BP Temp Temp src Pulse Resp SpO2  06/12/15 2101 158/68 mmHg 98 F (36.7 C) Oral 81 18 97 %    10:43 PM Reevaluation with update and discussion. After initial assessment and treatment, an updated evaluation reveals no change in clinical status. The patient is medically cleared at this time. Jarquis Walker L   Labs Review Labs Reviewed  ACETAMINOPHEN LEVEL - Abnormal; Notable for the following:    Acetaminophen (Tylenol), Serum <10 (*)    All other components within normal limits  COMPREHENSIVE METABOLIC PANEL - Abnormal; Notable for the following:    Glucose, Bld 108 (*)    Creatinine, Ser 1.47 (*)    ALT 15 (*)    GFR calc non Af Amer 46 (*)    GFR calc Af Amer 53 (*)    All other components within  normal limits  CBC  ETHANOL  SALICYLATE LEVEL  URINE RAPID DRUG SCREEN, HOSP PERFORMED    Imaging Review No results found.   EKG Interpretation None      MDM   Final diagnoses:  Behavioral disorder    Nonspecific behavioral disorder, inpatient who is relatively lucid. It is unclear on initial evaluation with the patient is here. There is no overt psychosis.   Nursing Notes Reviewed/ Care Coordinated, and agree without changes. Applicable Imaging Reviewed.  Interpretation of Laboratory Data incorporated into ED treatment  Plan: As per oncoming provider team in  conjunction with TTS    Daleen Bo, MD 06/12/15 2328

## 2015-06-12 NOTE — ED Notes (Signed)
Patient reports he is in ER tonight because he and his wife were in a verbal altercation.  He denies SI/HI, but admits to history of depression.  He reports that he has early stages of Alzheimers and that his medication is not helping him.  States wife is obtaining IVC paperwork at this time.
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
## 2015-06-13 DIAGNOSIS — F332 Major depressive disorder, recurrent severe without psychotic features: Secondary | ICD-10-CM | POA: Diagnosis not present

## 2015-06-13 MED ORDER — LAMOTRIGINE 25 MG PO TABS: 50.0000 mg | ORAL_TABLET | Freq: Every day | ORAL | Status: DC

## 2015-06-13 MED ORDER — HYDROXYZINE HCL 25 MG PO TABS: 25.0000 mg | ORAL_TABLET | Freq: Four times a day (QID) | ORAL | Status: DC | PRN

## 2015-06-13 MED ORDER — NICOTINE 21 MG/24HR TD PT24: 21.0000 mg | MEDICATED_PATCH | Freq: Every day | TRANSDERMAL | Status: DC | PRN

## 2015-06-13 MED ORDER — RISPERIDONE 0.5 MG PO TABS: 0.5000 mg | ORAL_TABLET | Freq: Every day | ORAL | Status: DC

## 2015-06-13 NOTE — BH Assessment (Signed)
Inpt gero psych placement being sought. Sent referrals to: [place], [address] [place], [place], [place], [place], [place], [place] Triage Specialist 06/13/2015 12:50 AM

## 2015-06-13 NOTE — [facility] Suicide Risk Assessment (Cosign Needed)
Suicide Risk Assessment  Discharge Assessment   [name] Discharge Suicide Risk Assessment   Demographic Factors:  Male, Age 74 or older, Caucasian, Low socioeconomic status and Unemployed  Total Time spent with patient: 20 minutes  Musculoskeletal: Strength & Muscle Tone: within normal limits Gait & Station: normal Patient leans: N/A  Psychiatric Specialty Exam:     Blood pressure 130/60, pulse 64, temperature 98 F (36.7 C), temperature source Oral, resp. rate 16, SpO2 100 %.There is no weight on file to calculate BMI.  General Appearance: Casual  Eye Contact::  Good  Speech:  Clear and Coherent and Normal Rate  Volume:  Normal  Mood:  Depressed  Affect:  Congruent  Thought Process:  Coherent, Goal Directed and Intact  Orientation:  Full (Time, Place, and Person)  Thought Content:  WDL  Suicidal Thoughts:  No  Homicidal Thoughts:  No  Memory:  Immediate;   Good Recent;   Fair Remote;   Fair  Judgement:  Fair  Insight:  Fair  Psychomotor Activity:  Normal  Concentration:  Fair  Recall:  [employer] of Knowledge:Fair  Language: Good  Akathisia:  NA  Handed:  Right  AIMS (if indicated):     Assets:  Desire for Improvement  ADL's:  Intact  Cognition: Impaired,  Mild        Has this patient used any form of tobacco in the last 30 days? (Cigarettes, Smokeless Tobacco, Cigars, and/or Pipes) Yes, Prescription not provided because: prescription was given and accepted.  Mental Status Per Nursing Assessment::   On Admission:     Current Mental Status by Physician: NA  Loss Factors: NA  Historical Factors: Prior suicide attempts  Risk Reduction Factors:   Sense of responsibility to family, Religious beliefs about death, Living with another person, especially a relative and Positive therapeutic relationship  Continued Clinical Symptoms:  Depression:   Insomnia  Cognitive Features That Contribute To Risk:  Polarized thinking    Suicide Risk:  Minimal: No  identifiable suicidal ideation.  Patients presenting with no risk factors but with morbid ruminations; may be classified as minimal risk based on the severity of the depressive symptoms  Principal Problem: Major depressive disorder without psychotic features Discharge Diagnoses:  Patient Active Problem List   Diagnosis Date Noted  . Major depressive disorder without psychotic features [F32.9] 05/24/2015    Priority: High  . Dementia [F03.90]   . Adjustment disorder with disturbance of emotion [F43.29] 04/23/2015  . Aggressive behavior [F60.89]   . Chest pain [R07.9] 09/15/2013  . Coronary atherosclerosis of native coronary artery [I25.10] 09/15/2013  . Hypokalemia [E87.6] 08/28/2013  . Acute respiratory failure [J96.00] 08/25/2013  . Altered mental status [R41.82] 08/25/2013  . Anoxic brain injury [G93.1] 08/25/2013  . HTN (hypertension) [I10] 08/25/2013  . Suicide attempt [T14.91] 08/25/2013  . Vitamin B 12 deficiency [E53.8] 07/15/2013  . Unspecified hereditary and idiopathic peripheral neuropathy [G60.9] 07/15/2013  . Memory loss [R41.3] 07/15/2013      Plan Of Care/Follow-up recommendations:  Activity:  as tolearted Diet:  regular  Is patient on multiple antipsychotic therapies at discharge:  No   Has Patient had three or more failed trials of antipsychotic monotherapy by history:  No  Recommended Plan for Multiple Antipsychotic Therapies: NA    Anthony Skinner C   PMHNP-BC 06/13/2015, 1:10 PM
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
## 2015-06-13 NOTE — Plan of Care (Signed)
Patient declined at [facility]. [facility].

## 2015-06-21 ENCOUNTER — Emergency Department (HOSPITAL_COMMUNITY)
Admission: EM | Admit: 2015-06-21 | Discharge: 2015-06-22 | Disposition: A | Discharge status: Home / Self Care | Payer: Medicare Other | Attending: Emergency Medicine | Admitting: Emergency Medicine

## 2015-06-21 ENCOUNTER — Encounter (HOSPITAL_COMMUNITY): Payer: Self-pay

## 2015-06-21 DIAGNOSIS — I10 Essential (primary) hypertension: Secondary | ICD-10-CM | POA: Insufficient documentation

## 2015-06-21 DIAGNOSIS — Z8739 Personal history of other diseases of the musculoskeletal system and connective tissue: Secondary | ICD-10-CM | POA: Insufficient documentation

## 2015-06-21 DIAGNOSIS — Z86018 Personal history of other benign neoplasm: Secondary | ICD-10-CM | POA: Diagnosis not present

## 2015-06-21 DIAGNOSIS — F0280 Dementia in other diseases classified elsewhere without behavioral disturbance: Secondary | ICD-10-CM | POA: Insufficient documentation

## 2015-06-21 DIAGNOSIS — Z862 Personal history of diseases of the blood and blood-forming organs and certain disorders involving the immune mechanism: Secondary | ICD-10-CM | POA: Insufficient documentation

## 2015-06-21 DIAGNOSIS — Z79899 Other long term (current) drug therapy: Secondary | ICD-10-CM | POA: Insufficient documentation

## 2015-06-21 DIAGNOSIS — I252 Old myocardial infarction: Secondary | ICD-10-CM | POA: Insufficient documentation

## 2015-06-21 DIAGNOSIS — Z8719 Personal history of other diseases of the digestive system: Secondary | ICD-10-CM | POA: Diagnosis not present

## 2015-06-21 DIAGNOSIS — Z9861 Coronary angioplasty status: Secondary | ICD-10-CM | POA: Diagnosis not present

## 2015-06-21 DIAGNOSIS — Z8546 Personal history of malignant neoplasm of prostate: Secondary | ICD-10-CM | POA: Insufficient documentation

## 2015-06-21 DIAGNOSIS — Z87891 Personal history of nicotine dependence: Secondary | ICD-10-CM | POA: Diagnosis not present

## 2015-06-21 DIAGNOSIS — I2510 Atherosclerotic heart disease of native coronary artery without angina pectoris: Secondary | ICD-10-CM | POA: Insufficient documentation

## 2015-06-21 DIAGNOSIS — Z8709 Personal history of other diseases of the respiratory system: Secondary | ICD-10-CM | POA: Diagnosis not present

## 2015-06-21 DIAGNOSIS — F919 Conduct disorder, unspecified: Secondary | ICD-10-CM | POA: Diagnosis not present

## 2015-06-21 DIAGNOSIS — IMO0002 Reserved for concepts with insufficient information to code with codable children: Secondary | ICD-10-CM

## 2015-06-21 DIAGNOSIS — R4182 Altered mental status, unspecified: Secondary | ICD-10-CM | POA: Diagnosis present

## 2015-06-21 DIAGNOSIS — Z8639 Personal history of other endocrine, nutritional and metabolic disease: Secondary | ICD-10-CM | POA: Diagnosis not present

## 2015-06-21 NOTE — Discharge Instructions (Signed)
Take all of your medicines as directed. Your current nighttime medicines are Lamictal 75 mg, Remeron 30 mg, and Risperdal 0.5 mg.

## 2015-06-21 NOTE — ED Notes (Signed)
Pt has alzheimers and is taking his medications and seems to be getting worse, his wife is looking for placement but nothing has happened yet. She wants him here to adjust his medications
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
## 2015-06-21 NOTE — ED Notes (Signed)
MD at bedside. 

## 2015-06-21 NOTE — ED Notes (Signed)
Patient and wife updated, Dr Eulis Foster will be coming to see them.

## 2015-06-22 NOTE — ED Notes (Signed)
Patient is alert and oriented x3.  He was given DC instructions and follow up visit instructions.  Patient gave verbal understanding.  He was DC ambulatory under his own power to home.  V/S stable.  He was not showing any signs of distress on DC 

## 2015-08-31 ENCOUNTER — Encounter (HOSPITAL_COMMUNITY): Payer: Self-pay

## 2015-08-31 ENCOUNTER — Emergency Department (HOSPITAL_COMMUNITY)
Admission: EM | Admit: 2015-08-31 | Discharge: 2015-09-01 | Disposition: A | Discharge status: Home / Self Care | Payer: Medicare Other | Attending: Emergency Medicine | Admitting: Emergency Medicine

## 2015-08-31 DIAGNOSIS — Z8719 Personal history of other diseases of the digestive system: Secondary | ICD-10-CM | POA: Insufficient documentation

## 2015-08-31 DIAGNOSIS — Z87891 Personal history of nicotine dependence: Secondary | ICD-10-CM | POA: Insufficient documentation

## 2015-08-31 DIAGNOSIS — Z7982 Long term (current) use of aspirin: Secondary | ICD-10-CM | POA: Insufficient documentation

## 2015-08-31 DIAGNOSIS — M1990 Unspecified osteoarthritis, unspecified site: Secondary | ICD-10-CM | POA: Diagnosis not present

## 2015-08-31 DIAGNOSIS — I10 Essential (primary) hypertension: Secondary | ICD-10-CM | POA: Diagnosis not present

## 2015-08-31 DIAGNOSIS — F03918 Unspecified dementia, unspecified severity, with other behavioral disturbance: Secondary | ICD-10-CM | POA: Diagnosis present

## 2015-08-31 DIAGNOSIS — Z79899 Other long term (current) drug therapy: Secondary | ICD-10-CM | POA: Insufficient documentation

## 2015-08-31 DIAGNOSIS — F329 Major depressive disorder, single episode, unspecified: Secondary | ICD-10-CM | POA: Diagnosis not present

## 2015-08-31 DIAGNOSIS — Z8639 Personal history of other endocrine, nutritional and metabolic disease: Secondary | ICD-10-CM | POA: Insufficient documentation

## 2015-08-31 DIAGNOSIS — Z9889 Other specified postprocedural states: Secondary | ICD-10-CM | POA: Insufficient documentation

## 2015-08-31 DIAGNOSIS — Z86018 Personal history of other benign neoplasm: Secondary | ICD-10-CM | POA: Insufficient documentation

## 2015-08-31 DIAGNOSIS — Z8711 Personal history of peptic ulcer disease: Secondary | ICD-10-CM | POA: Insufficient documentation

## 2015-08-31 DIAGNOSIS — E538 Deficiency of other specified B group vitamins: Secondary | ICD-10-CM

## 2015-08-31 DIAGNOSIS — I252 Old myocardial infarction: Secondary | ICD-10-CM | POA: Insufficient documentation

## 2015-08-31 DIAGNOSIS — Z8546 Personal history of malignant neoplasm of prostate: Secondary | ICD-10-CM | POA: Diagnosis not present

## 2015-08-31 DIAGNOSIS — F0391 Unspecified dementia with behavioral disturbance: Secondary | ICD-10-CM | POA: Insufficient documentation

## 2015-08-31 DIAGNOSIS — Z008 Encounter for other general examination: Secondary | ICD-10-CM | POA: Diagnosis present

## 2015-08-31 DIAGNOSIS — F32A Depression, unspecified: Secondary | ICD-10-CM

## 2015-08-31 DIAGNOSIS — I2510 Atherosclerotic heart disease of native coronary artery without angina pectoris: Secondary | ICD-10-CM | POA: Diagnosis not present

## 2015-08-31 LAB — I-STAT CHEM 8, ED
BUN: 13 mg/dL (ref 6–20)
Calcium, Ion: 1.18 mmol/L (ref 1.13–1.30)
Chloride: 103 mmol/L (ref 101–111)
Creatinine, Ser: 1.2 mg/dL (ref 0.61–1.24)
Glucose, Bld: 104 mg/dL — ABNORMAL HIGH (ref 65–99)
HCT: 41 % (ref 39.0–52.0)
Hemoglobin: 13.9 g/dL (ref 13.0–17.0)
Potassium: 4 mmol/L (ref 3.5–5.1)
Sodium: 141 mmol/L (ref 135–145)
TCO2: 25 mmol/L (ref 0–100)

## 2015-08-31 LAB — CBC WITH DIFFERENTIAL/PLATELET
Basophils Absolute: 0.1 10*3/uL (ref 0.0–0.1)
Basophils Relative: 1 %
Eosinophils Absolute: 0.2 10*3/uL (ref 0.0–0.7)
Eosinophils Relative: 4 %
HCT: 40.1 % (ref 39.0–52.0)
Hemoglobin: 13.4 g/dL (ref 13.0–17.0)
Lymphocytes Relative: 29 %
Lymphs Abs: 1.3 10*3/uL (ref 0.7–4.0)
MCH: 28.3 pg (ref 26.0–34.0)
MCHC: 33.4 g/dL (ref 30.0–36.0)
MCV: 84.6 fL (ref 78.0–100.0)
Monocytes Absolute: 0.6 10*3/uL (ref 0.1–1.0)
Monocytes Relative: 13 %
Neutro Abs: 2.4 10*3/uL (ref 1.7–7.7)
Neutrophils Relative %: 53 %
Platelets: 210 10*3/uL (ref 150–400)
RBC: 4.74 MIL/uL (ref 4.22–5.81)
RDW: 14.8 % (ref 11.5–15.5)
WBC: 4.5 10*3/uL (ref 4.0–10.5)

## 2015-08-31 LAB — ETHANOL: Alcohol, Ethyl (B): <5 mg/dL (ref <5)

## 2015-08-31 NOTE — Progress Notes (Signed)
CSW was consulted by NP to speak with patient regarding alzheimer's and interest in future placement.  CSW met with patient at bedside. Wife was present. Both confirm that patient presents to [facility] due to agitation associated with dementia. Also, wife confirms that the patient has been been having moments where he starts staring out "into space". Wide states that this behavior is new for patient, and it has just started happening within the past 5-6 days.  Patient states that he believes the incident tonight was due to dementia and due to the fact that he did not like his wife stating that he has been having these current new episodes of stairing off into space. Patient informed CSW that he cannot remember everything that happened.  CSW spoke with patient and wife about having discussions when he is alert and oriented about future placement and that will be a plan when agitation happens.  CSW gave patient and wife information regarding facilities and spoke with them about how it may be beneficial to apply for Medicaid. Wife states that she feels safe.   Patient was alert and oriented during interview. Patient and wife state that they do not have any questions for CSW. CSW consulted with TTS who states that the recommendation from psychiatric NP is for patient to stay overnight and be evaluated in the morning. Per note, the patient has a history of SI. He attempted suicide in 2014.  Willette Brace [phone] ED CSW 08/31/2015 11:01 PM

## 2015-08-31 NOTE — ED Notes (Signed)
Pt's wife states that he's been dx with the beginning stages of alzheimers and she states tonight he was in a trance and can't remember things and he gets really irritated quickly

## 2015-08-31 NOTE — ED Notes (Signed)
Wife states that she's afraid to go to sleep tonight and she was told to bring him to the ED when he gets like this.

## 2015-08-31 NOTE — ED Notes (Signed)
Bed: WA28 Expected date:  Expected time:  Means of arrival:  Comments: Hold for triage 4 
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
## 2015-08-31 NOTE — Progress Notes (Signed)
[facility] consulted for home health services.  Patient lives at home with  his wife.  Patient reports, "I got a little rambunkcious tonight. I would never hit or hurt her, but she don't know that.  She got scared and that's why I'm here spending the night."  Patient is pleasant and cooperative.  EDCM provided patient with list of home health agencies in [place] explained services and also provided patient with list of private duty nursing agencies, however patient does not meet criteria for home health at this time.  Patient is able to complete all ADL's without difficulty and is ambulatory without use of walking device.  Patient also refused home health and private duty.  Patient stated, "i don't need someone to come in and sit with me."  Patient requested somewhere he can go during the day because, "Me and my wife are together all the time, and that's not good.  I need to leave the house for a little while during the day."  [facility] provided patient with list of [facility] in [facility].  Patient very thankful for services.  No further EDCM needs at this time.

## 2015-08-31 NOTE — BH Assessment (Signed)
2003:  Consulted with Junius Creamer, NP about the Patient.  Reports Patient has Alzheimer and has been to the ED before.  Reports Patient's Spouse reports he goes into trances and has become agitated and threatening and she is currently afraid of him.  Reports Patient has been hospitalized before for behaviors.   2230:  Completed assessment.   2234:  Consulted with Extender Arlester Marker about the Patient.  Per Extender Nwaeze:  Patient will be reevaluated in the morning.  2239: Provided Junius Creamer, NP with Patient's disposition.

## 2015-08-31 NOTE — BH Assessment (Signed)
Assessment Note  Anthony Skinner is an 74 y.o. male., who was brought to [facility] by Spouse.  Patient was orientated x3, mood "depressed" about early stage of dementia, affect congruent with mood.  Patient denied SI, HI, AVH.  He reports feeling depressed about having dementia.  Patient's Spouse, Anthony Skinner, provided collateral information:  Reports Patient disassociate in trances and can't remember them.  She also reports he becomes agitated and threatening and she is afraid.  Patient admitted to yelling and screaming tonight, however he reports no intent to harm Spouse.  Spouse reports the Patient is seen by Dr. Zeb Comfort for depression and dementia and is currently prescribed;  Sertaline HCL 25 mg, Mirtazapine 30 mg, and Risperidone 0.5 mg.  She reports the Patient is medication adherent and that she manages his medication.  [name] reports the Patient has been previously hospitalized at [facility] [place].  She reports they are working with DSS for in-home caregiver and day respite for the Patient.  [name] reports her 3 adult children are supportive.        Axis I: Depressive Disorder NOS Axis II: Deferred Axis IV: other psychosocial or environmental problems, problems with access to health care services and problems with primary support group Axis V: 21-30 behavior considerably influenced by delusions or hallucinations OR serious impairment in judgment, communication OR inability to function in almost all areas  Past Medical History:  Past Medical History  Diagnosis Date  . Iron deficiency anemia   . Tubular adenoma of colon 2012  . Gastropathy 2012    reactive  . PUD (peptic ulcer disease)   . Prostate cancer     a. 09/2008 s/p prostatectomy.  . CAD (coronary artery disease)     a. reported h/o MI in the 30's;  b. 04/2000 Cath: LM nl, LAD 40p, D1 small, nl, RI nl, LCX nl, RCA nl.  . Hyperlipidemia   . Vertebral artery stenosis     a. 09/2010 s/p L vertebral stenting  09/2010.  Marland Kitchen GERD (gastroesophageal reflux disease)   . Hiatal hernia   . Recurrent spontaneous pneumothorax     a. s/p L lobectomy in 1966.  Marland Kitchen DJD (degenerative joint disease)   . Hypertension   . Upper GI bleed     a. 2012  . Fatty liver   . Hypertension   . Syncope     a. in setting of GIB in 2012, presumed to be orthostatic.  . Suicide attempt     a. 08/2013 attempt by hanging with subsequent resp failure  . Myocardial infarction     " BACK IN THE 90'S"  . Dementia   . Shortness of breath     Past Surgical History  Procedure Laterality Date  . Prostatectomy    . Vertebral artery stent    . Lung removal, partial  1960s    left  . Cataract extraction Right   . Cardiac surgery    . Cardiac catheterization  09/15/2013  . Left heart catheterization with coronary angiogram N/A 09/15/2013    Procedure: LEFT HEART CATHETERIZATION WITH CORONARY ANGIOGRAM;  Surgeon: Burnell Blanks, MD;  Location: [facility] CATH LAB;  Service: Cardiovascular;  Laterality: N/A;    Family History:  Family History  Problem Relation Age of Onset  . Heart attack Father   . Alzheimer's disease Mother   . Prostate cancer Brother     Social History:  reports that he quit smoking about 13 years ago. His smoking  use included Cigarettes. He has a 40 pack-year smoking history. He has never used smokeless tobacco. He reports that he does not drink alcohol or use illicit drugs.  Additional Social History:     CIWA: CIWA-Ar BP: 138/66 mmHg Pulse Rate: (!) 56 COWS:    Allergies: No Known Allergies  Home Medications:  (Not in a hospital admission)  OB/GYN Status:  No LMP for male patient.  General Assessment Data Location of Assessment: [facility] TTS Assessment: In system Is this a Tele or Face-to-Face Assessment?: Face-to-Face Is this an Initial Assessment or a Re-assessment for this encounter?: Initial Assessment Marital status: Married Warden name: N/A Is patient pregnant?: No Pregnancy Status:  No Living Arrangements: Spouse/significant other Can pt return to current living arrangement?: No (Spouse is afraid of Patient) Admission Status: Voluntary Is patient capable of signing voluntary admission?: Yes Referral Source: Self/Family/Friend Insurance type: Medicare  Medical Screening Exam ([place]) Medical Exam completed: Yes  Crisis Care Plan Living Arrangements: Spouse/significant other Name of Psychiatrist: Dr.  Zeb Comfort Name of Therapist: None  Education Status Is patient currently in school?: No Current Grade: N/A Highest grade of school patient has completed: N/A Name of school: N/A Contact person: N/A  Risk to self with the past 6 months Suicidal Ideation: No Has patient been a risk to self within the past 6 months prior to admission? : No Suicidal Intent: No Has patient had any suicidal intent within the past 6 months prior to admission? : No Is patient at risk for suicide?: No Suicidal Plan?: No Has patient had any suicidal plan within the past 6 months prior to admission? : No Access to Means: No What has been your use of drugs/alcohol within the last 12 months?: None Previous Attempts/Gestures: Yes How many times?: 1 Other Self Harm Risks: None Triggers for Past Attempts: Hallucinations (Voice saying he is worthless) Intentional Self Injurious Behavior: None Family Suicide History: Unknown Recent stressful life event(s): Other (Comment) (early stage of dementia) Persecutory voices/beliefs?: No Depression: Yes Depression Symptoms: Feeling worthless/self pity, Guilt, Despondent Substance abuse history and/or treatment for substance abuse?: No Suicide prevention information given to non-admitted patients: Not applicable  Risk to Others within the past 6 months Homicidal Ideation: No Does patient have any lifetime risk of violence toward others beyond the six months prior to admission? : No Thoughts of Harm to Others: No Current Homicidal Intent:  No Current Homicidal Plan: No Access to Homicidal Means: No Identified Victim: None History of harm to others?: No Assessment of Violence: None Noted Violent Behavior Description: None Does patient have access to weapons?: No Criminal Charges Pending?: No Does patient have a court date: No Is patient on probation?: No  Psychosis Hallucinations: None noted Delusions: None noted  Mental Status Report Appearance/Hygiene: Unremarkable Eye Contact: Fair Motor Activity: Restlessness Speech: Logical/coherent Level of Consciousness: Alert Mood: Depressed, Anxious Affect: Depressed, Anxious Anxiety Level: Moderate Thought Processes: Coherent, Relevant Judgement: Partial Obsessive Compulsive Thoughts/Behaviors: None  Cognitive Functioning Concentration: Decreased Memory: Recent Intact, Remote Intact IQ: Average Insight: Fair Impulse Control: Fair Appetite: Fair Weight Loss: 50 Weight Gain: 0 Sleep: Decreased Total Hours of Sleep: 5 Vegetative Symptoms: None  ADLScreening [facility] Assessment Services) Patient's cognitive ability adequate to safely complete daily activities?: No (Spouse has to supervise) Patient able to express need for assistance with ADLs?: Yes Independently performs ADLs?: Yes (appropriate for developmental age)  Prior Inpatient Therapy Prior Inpatient Therapy: Yes Prior Therapy Dates: July 2016 Prior Therapy Facilty/Provider(s): [facility] Reason for  Treatment: Depression  Prior Outpatient Therapy Prior Outpatient Therapy: Yes Prior Therapy Dates: Current (Psychiatrist for med management) Prior Therapy Facilty/Provider(s): Dr. Zeb Comfort Reason for Treatment: Depression and dementia Does patient have an ACCT team?: No Does patient have Intensive In-House Services?  : No Does patient have [facility] services? : No Does patient have P4CC services?: No  ADL Screening (condition at time of admission) Patient's cognitive ability adequate to safely  complete daily activities?: No (Spouse has to supervise) Is the patient deaf or have difficulty hearing?: No Does the patient have difficulty seeing, even when wearing glasses/contacts?: No Does the patient have difficulty concentrating, remembering, or making decisions?: Yes (early stage of dementia.) Patient able to express need for assistance with ADLs?: Yes Does the patient have difficulty dressing or bathing?: No Independently performs ADLs?: Yes (appropriate for developmental age) Weakness of Legs: None Weakness of Arms/Hands: None  Home Assistive Devices/Equipment Home Assistive Devices/Equipment: None    Abuse/Neglect Assessment (Assessment to be complete while patient is alone) Physical Abuse: Denies Verbal Abuse: Denies Sexual Abuse: Denies Exploitation of patient/patient's resources: Denies Self-Neglect: Denies Values / Beliefs Cultural Requests During Hospitalization: None Spiritual Requests During Hospitalization: None   Advance Directives (For Healthcare) Does patient have an advance directive?: No Would patient like information on creating an advanced directive?: No - patient declined information    Additional Information 1:1 In Past 12 Months?: No CIRT Risk: No Elopement Risk: No Does patient have medical clearance?: Yes     Disposition:  Disposition Initial Assessment Completed for this Encounter: Yes Disposition of Patient: Inpatient treatment program (Re-evaluate in the morning) Type of inpatient treatment program: Adult  On Site Evaluation by:   Reviewed with Physician:    Dey-Johnson,Breylon Sherrow 08/31/2015 10:51 PM

## 2015-09-01 DIAGNOSIS — F03918 Unspecified dementia, unspecified severity, with other behavioral disturbance: Secondary | ICD-10-CM | POA: Diagnosis present

## 2015-09-01 DIAGNOSIS — F0391 Unspecified dementia with behavioral disturbance: Secondary | ICD-10-CM

## 2015-09-01 LAB — URINALYSIS, ROUTINE W REFLEX MICROSCOPIC
Bilirubin Urine: NEGATIVE
Glucose, UA: NEGATIVE mg/dL
Hgb urine dipstick: NEGATIVE
Ketones, ur: NEGATIVE mg/dL
Leukocytes, UA: NEGATIVE
Nitrite: NEGATIVE
Protein, ur: NEGATIVE mg/dL
Specific Gravity, Urine: 1.004 — ABNORMAL LOW (ref 1.005–1.030)
Urobilinogen, UA: 1 mg/dL (ref 0.0–1.0)
pH: 6 (ref 5.0–8.0)

## 2015-09-01 MED ORDER — MEMANTINE HCL 5 MG PO TABS
5.0000 mg | ORAL_TABLET | Freq: Every morning | ORAL | Status: DC
  Administered 2015-09-01: 5 mg via ORAL
  Filled 2015-09-01: qty 1

## 2015-09-01 MED ORDER — MIRTAZAPINE 30 MG PO TABS: 30.0000 mg | ORAL_TABLET | Freq: Every day | ORAL | Status: DC

## 2015-09-01 MED ORDER — SERTRALINE HCL 50 MG PO TABS: 50.0000 mg | ORAL_TABLET | Freq: Every day | ORAL | Status: DC

## 2015-09-01 MED ORDER — MIRTAZAPINE 30 MG PO TABS
30.0000 mg | ORAL_TABLET | Freq: Every day | ORAL | Status: DC
  Administered 2015-09-01: 30 mg via ORAL
  Filled 2015-09-01: qty 1

## 2015-09-01 MED ORDER — SERTRALINE HCL 50 MG PO TABS
25.0000 mg | ORAL_TABLET | Freq: Three times a day (TID) | ORAL | Status: DC
Start: 2015-09-01 — End: 2015-09-01
  Administered 2015-09-01: 25 mg via ORAL
  Filled 2015-09-01: qty 1

## 2015-09-01 MED ORDER — RISPERIDONE 0.5 MG PO TABS: 0.5000 mg | ORAL_TABLET | Freq: Every day | ORAL | Status: DC

## 2015-09-01 MED ORDER — ASPIRIN 81 MG PO CHEW
81.0000 mg | CHEWABLE_TABLET | Freq: Every day | ORAL | Status: DC
  Administered 2015-09-01: 81 mg via ORAL
  Filled 2015-09-01: qty 1

## 2015-09-01 NOTE — BHH Suicide Risk Assessment (Cosign Needed)
Suicide Risk Assessment  Discharge Assessment   [facility] Discharge Suicide Risk Assessment   Demographic Factors:  Male, Age 72 or older and Caucasian  Total Time spent with patient: 20 minutes  Musculoskeletal: Strength & Muscle Tone: within normal limits Gait & Station: normal Patient leans: N/A  Psychiatric Specialty Exam:     Blood pressure 122/58, pulse 50, temperature 98.3 F (36.8 C), temperature source Oral, resp. rate 16, SpO2 98 %.There is no weight on file to calculate BMI.  General Appearance: Casual and Well Groomed  Eye Contact:: Good  Speech: Clear and Coherent and Normal Rate  Volume: Normal  Mood: Euthymic  Affect: Congruent  Thought Process: Coherent, Goal Directed and Intact  Orientation: Full (Time, Place, and Person)  Thought Content: WDL  Suicidal Thoughts: No  Homicidal Thoughts: No  Memory: Immediate; Good Recent; Good Remote; Fair  Judgement: Fair  Insight: Good  Psychomotor Activity: Normal  Concentration: Good  Recall: [place] of Knowledge:Good  Language: Good  Akathisia: NA  Handed: Right  AIMS (if indicated):    Assets: Desire for Improvement  ADL's: Intact  Cognition: Impaired, Mild             Has this patient used any form of tobacco in the last 30 days? (Cigarettes, Smokeless Tobacco, Cigars, and/or Pipes) N/A  Mental Status Per Nursing Assessment::   On Admission:     Current Mental Status by Physician: NA  Loss Factors: NA  Historical Factors: Prior suicide attempts  Risk Reduction Factors:   Religious beliefs about death, Living with another person, especially a relative and Positive therapeutic relationship  Continued Clinical Symptoms:  Depression:   Insomnia  Cognitive Features That Contribute To Risk:  Polarized thinking    Suicide Risk:  Minimal: No identifiable suicidal ideation.  Patients presenting with no risk factors but with morbid ruminations;  may be classified as minimal risk based on the severity of the depressive symptoms  Principal Problem: Dementia with behavioral disturbance Discharge Diagnoses:  Patient Active Problem List   Diagnosis Date Noted  . Dementia with behavioral disturbance [F03.91] 09/01/2015    Priority: High  . Major depressive disorder without psychotic features [F32.9] 05/24/2015    Priority: High  . Dementia [F03.90]   . Adjustment disorder with disturbance of emotion [F43.29] 04/23/2015  . Aggressive behavior [F60.89]   . Chest pain [R07.9] 09/15/2013  . Coronary atherosclerosis of native coronary artery [I25.10] 09/15/2013  . Hypokalemia [E87.6] 08/28/2013  . Acute respiratory failure [J96.00] 08/25/2013  . Altered mental status [R41.82] 08/25/2013  . Anoxic brain injury [G93.1] 08/25/2013  . HTN (hypertension) [I10] 08/25/2013  . Suicide attempt [T14.91] 08/25/2013  . Vitamin B 12 deficiency [E53.8] 07/15/2013  . Unspecified hereditary and idiopathic peripheral neuropathy [G60.9] 07/15/2013  . Memory loss [R41.3] 07/15/2013      Plan Of Care/Follow-up recommendations:  Activity:  as tolerated Diet:  Regular  Is patient on multiple antipsychotic therapies at discharge:  No   Has Patient had three or more failed trials of antipsychotic monotherapy by history:  No  Recommended Plan for Multiple Antipsychotic Therapies: NA    Dayleen Beske C   PMHNP-BC 09/01/2015, 2:54 PM

## 2015-09-01 NOTE — ED Notes (Signed)
Pt's wife at bedside.
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
## 2015-09-01 NOTE — Consult Note (Signed)
[place] [facility] Consult   Reason for Consult:  Dementia with behavioral disturbance Referring Physician:  EDP Patient Identification: Anthony Skinner  MRN:  [id number] Principal Diagnosis: Dementia with behavioral disturbance Diagnosis:   Patient Active Problem List   Diagnosis Date Noted  . Dementia with behavioral disturbance [F03.91] 09/01/2015    Priority: High  . Major depressive disorder without psychotic features [F32.9] 05/24/2015    Priority: High  . Dementia [F03.90]   . Adjustment disorder with disturbance of emotion [F43.29] 04/23/2015  . Aggressive behavior [F60.89]   . Chest pain [R07.9] 09/15/2013  . Coronary atherosclerosis of native coronary artery [I25.10] 09/15/2013  . Hypokalemia [E87.6] 08/28/2013  . Acute respiratory failure [J96.00] 08/25/2013  . Altered mental status [R41.82] 08/25/2013  . Anoxic brain injury [G93.1] 08/25/2013  . HTN (hypertension) [I10] 08/25/2013  . Suicide attempt [T14.91] 08/25/2013  . Vitamin B 12 deficiency [E53.8] 07/15/2013  . Unspecified hereditary and idiopathic peripheral neuropathy [G60.9] 07/15/2013  . Memory loss [R41.3] 07/15/2013    Total Time spent with patient: 45 minutes  Subjective:   Anthony Skinner is a 74 y.o. male patient admitted with  Dementia with behavioral disturbance.  HPI:  Caucasian 74 years old with a hx of Dementia was evaluated for agitation and aggression towards his wife.  He was brought in last evening by his wife.  This is one of his visits for behavior problems at home which happens mostly in the evenings.  Patient lives with his wife and takes his medications for Dementia and depression.  Patient reports occasionally getting aggitation and angry towards his wife without remembering what he had done.  Today, he did not remember why he was brought in by his wife but stated that he does feel irritable towards the evening time.  He denies SI/HI/AVH, he reports good sleep and appetite.  He denies  the use Alcohol and drugs.   He is remorseful for scaring his wife.  He has good insight in his diagnosis of Dementia and behavior issues.  Patient will be discharged home back to his wife who has agreed to pick him up.  HPI Elements:   Location:  Dementia with behavior disturbance. Quality:  Moderate. Severity:  Moderate. Timing:  Acute . Duration:  a year. Context:  Brought in by his wife for agitation..  Past Medical History:  Past Medical History  Diagnosis Date  . Iron deficiency anemia   . Tubular adenoma of colon 2012  . Gastropathy 2012    reactive  . PUD (peptic ulcer disease)   . Prostate cancer     a. 09/2008 s/p prostatectomy.  . CAD (coronary artery disease)     a. reported h/o MI in the 70's;  b. 04/2000 Cath: LM nl, LAD 40p, D1 small, nl, RI nl, LCX nl, RCA nl.  . Hyperlipidemia   . Vertebral artery stenosis     a. 09/2010 s/p L vertebral stenting 09/2010.  Marland Kitchen GERD (gastroesophageal reflux disease)   . Hiatal hernia   . Recurrent spontaneous pneumothorax     a. s/p L lobectomy in 1966.  Marland Kitchen DJD (degenerative joint disease)   . Hypertension   . Upper GI bleed     a. 2012  . Fatty liver   . Hypertension   . Syncope     a. in setting of GIB in 2012, presumed to be orthostatic.  . Suicide attempt     a. 08/2013 attempt by hanging with subsequent resp failure  . Myocardial  infarction     " BACK IN THE 90'S"  . Dementia   . Shortness of breath     Past Surgical History  Procedure Laterality Date  . Prostatectomy    . Vertebral artery stent    . Lung removal, partial  1960s    left  . Cataract extraction Right   . Cardiac surgery    . Cardiac catheterization  09/15/2013  . Left heart catheterization with coronary angiogram N/A 09/15/2013    Procedure: LEFT HEART CATHETERIZATION WITH CORONARY ANGIOGRAM;  Surgeon: Burnell Blanks, MD;  Location: [facility] CATH LAB;  Service: Cardiovascular;  Laterality: N/A;   Family History:  Family History  Problem Relation  Age of Onset  . Heart attack Father   . Alzheimer's disease Mother   . Prostate cancer Brother    Social History:  History  Alcohol Use No    Comment: Hx heavy EtOH use but quit 2011     History  Drug Use No    Social History   Social History  . Marital Status: Married    Spouse Name: Terri Piedra  . Number of Children: 0  . Years of Education: 12th   Occupational History  . retired    Social History Main Topics  . Smoking status: Former Smoker -- 1.00 packs/day for 40 years    Types: Cigarettes    Quit date: 08/27/2002  . Smokeless tobacco: Never Used  . Alcohol Use: No     Comment: Hx heavy EtOH use but quit 2011  . Drug Use: No  . Sexual Activity: Not Asked   Other Topics Concern  . None   Social History Narrative   Lives in [place] with wife.  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   Caffeine Use: 4 cups daily       Additional Social History:                          Allergies:  No Known Allergies  Labs:  Results for orders placed or performed during the hospital encounter of 08/31/15 (from the past 48 hour(s))  CBC with Differential     Status: None   Collection Time: 08/31/15  9:19 PM  Result Value Ref Range   WBC 4.5 4.0 - 10.5 K/uL   RBC 4.74 4.22 - 5.81 MIL/uL   Hemoglobin 13.4 13.0 - 17.0 g/dL   HCT 40.1 39.0 - 52.0 %   MCV 84.6 78.0 - 100.0 fL   MCH 28.3 26.0 - 34.0 pg   MCHC 33.4 30.0 - 36.0 g/dL   RDW 14.8 11.5 - 15.5 %   Platelets 210 150 - 400 K/uL   Neutrophils Relative % 53 %   Neutro Abs 2.4 1.7 - 7.7 K/uL   Lymphocytes Relative 29 %   Lymphs Abs 1.3 0.7 - 4.0 K/uL   Monocytes Relative 13 %   Monocytes Absolute 0.6 0.1 - 1.0 K/uL   Eosinophils Relative 4 %   Eosinophils Absolute 0.2 0.0 - 0.7 K/uL   Basophils Relative 1 %   Basophils Absolute 0.1 0.0 - 0.1 K/uL  Ethanol     Status: None   Collection Time: 08/31/15  9:19 PM  Result Value Ref Range   Alcohol, Ethyl (B) <5 <5 mg/dL    Comment:        LOWEST  DETECTABLE LIMIT FOR SERUM ALCOHOL IS 5 mg/dL FOR MEDICAL PURPOSES ONLY   I-stat chem 8, ed  Status: Abnormal   Collection Time: 08/31/15  9:27 PM  Result Value Ref Range   Sodium 141 135 - 145 mmol/L   Potassium 4.0 3.5 - 5.1 mmol/L   Chloride 103 101 - 111 mmol/L   BUN 13 6 - 20 mg/dL   Creatinine, Ser 1.20 0.61 - 1.24 mg/dL   Glucose, Bld 104 (H) 65 - 99 mg/dL   Calcium, Ion 1.18 1.13 - 1.30 mmol/L   TCO2 25 0 - 100 mmol/L   Hemoglobin 13.9 13.0 - 17.0 g/dL   HCT 41.0 39.0 - 52.0 %  Urinalysis, Routine w reflex microscopic (not at [facility])     Status: Abnormal   Collection Time: 09/01/15  1:29 AM  Result Value Ref Range   Color, Urine YELLOW YELLOW   APPearance CLEAR CLEAR   Specific Gravity, Urine 1.004 (L) 1.005 - 1.030   pH 6.0 5.0 - 8.0   Glucose, UA NEGATIVE NEGATIVE mg/dL   Hgb urine dipstick NEGATIVE NEGATIVE   Bilirubin Urine NEGATIVE NEGATIVE   Ketones, ur NEGATIVE NEGATIVE mg/dL   Protein, ur NEGATIVE NEGATIVE mg/dL   Urobilinogen, UA 1.0 0.0 - 1.0 mg/dL   Nitrite NEGATIVE NEGATIVE   Leukocytes, UA NEGATIVE NEGATIVE    Comment: MICROSCOPIC NOT DONE ON URINES WITH NEGATIVE PROTEIN, BLOOD, LEUKOCYTES, NITRITE, OR GLUCOSE <1000 mg/dL.    Vitals: Blood pressure 122/58, pulse 50, temperature 98.3 F (36.8 C), temperature source Oral, resp. rate 16, SpO2 98 %.  Risk to Self: Suicidal Ideation: No Suicidal Intent: No Is patient at risk for suicide?: No Suicidal Plan?: No Access to Means: No What has been your use of drugs/alcohol within the last 12 months?: None How many times?: 1 Other Self Harm Risks: None Triggers for Past Attempts: Hallucinations (Voice saying he is worthless) Intentional Self Injurious Behavior: None Risk to Others: Homicidal Ideation: No Thoughts of Harm to Others: No Current Homicidal Intent: No Current Homicidal Plan: No Access to Homicidal Means: No Identified Victim: None History of harm to others?: No Assessment of Violence:  None Noted Violent Behavior Description: None Does patient have access to weapons?: No Criminal Charges Pending?: No Does patient have a court date: No Prior Inpatient Therapy: Prior Inpatient Therapy: Yes Prior Therapy Dates: July 2016 Prior Therapy Facilty/Provider(s): [facility] Reason for Treatment: Depression Prior Outpatient Therapy: Prior Outpatient Therapy: Yes Prior Therapy Dates: Current (Psychiatrist for med management) Prior Therapy Facilty/Provider(s): Dr. Zeb Comfort Reason for Treatment: Depression and dementia Does patient have an ACCT team?: No Does patient have Intensive In-House Services?  : No Does patient have [facility] services? : No Does patient have P4CC services?: No  Current Facility-Administered Medications  Medication Dose Route Frequency Provider Last Rate Last Dose  . aspirin chewable tablet 81 mg  81 mg Oral Daily Hollace Kinnier [place], PA-C   81 mg at 09/01/15 3976  . memantine (NAMENDA) tablet 5 mg  5 mg Oral q morning - 10a Fransico Meadow, PA-C   5 mg at 09/01/15 0947  . mirtazapine (REMERON) tablet 30 mg  30 mg Oral Daily Hollace Kinnier [place], PA-C   30 mg at 09/01/15 0947  . risperiDONE (RISPERDAL) tablet 0.5 mg  0.5 mg Oral QHS Hollace Kinnier [place], PA-C      . sertraline (ZOLOFT) tablet 25 mg  25 mg Oral TID Fransico Meadow, PA-C   25 mg at 09/01/15 7341   Current Outpatient Prescriptions  Medication Sig Dispense Refill  . aspirin 81 MG tablet Take 81 mg by mouth  daily.    . memantine (NAMENDA) 5 MG tablet Take 5 mg by mouth every morning.   0  . mirtazapine (REMERON) 30 MG tablet Take 30 mg by mouth daily.   0  . risperiDONE (RISPERDAL) 0.5 MG tablet Take 1 tablet (0.5 mg total) by mouth at bedtime. 30 tablet 0  . sertraline (ZOLOFT) 25 MG tablet Take 25 mg by mouth 3 (three) times daily.      Musculoskeletal: Strength & Muscle Tone: within normal limits Gait & Station: normal Patient leans: N/A  Psychiatric Specialty Exam: Physical Exam  Review of  Systems  Constitutional: Negative.   HENT: Negative.   Eyes: Negative.   Respiratory: Negative.   Cardiovascular: Negative.   Gastrointestinal: Negative.   Genitourinary: Negative.   Musculoskeletal: Negative.   Skin: Negative.   Neurological: Negative.   Endo/Heme/Allergies: Negative.     Blood pressure 122/58, pulse 50, temperature 98.3 F (36.8 C), temperature source Oral, resp. rate 16, SpO2 98 %.There is no weight on file to calculate BMI.  General Appearance: Casual and Well Groomed  Eye Contact::  Good  Speech:  Clear and Coherent and Normal Rate  Volume:  Normal  Mood:  Euthymic  Affect:  Congruent  Thought Process:  Coherent, Goal Directed and Intact  Orientation:  Full (Time, Place, and Person)  Thought Content:  WDL  Suicidal Thoughts:  No  Homicidal Thoughts:  No  Memory:  Immediate;   Good Recent;   Good Remote;   Fair  Judgement:  Fair  Insight:  Good  Psychomotor Activity:  Normal  Concentration:  Good  Recall:  [place] of Knowledge:Good  Language: Good  Akathisia:  NA  Handed:  Right  AIMS (if indicated):     Assets:  Desire for Improvement  ADL's:  Intact  Cognition: Impaired,  Mild  Sleep:      Medical Decision Making: Established Problem, Stable/Improving (1)   Disposition: Discharge home, follow up with your outpatient provider  Delfin Gant   PMHNP-BC 09/01/2015 10:15 AM Patient seen face-to-face for psychiatric evaluation, chart reviewed and case discussed with the physician extender and developed treatment plan. Reviewed the information documented and agree with the treatment plan. Corena Pilgrim, MD

## 2015-09-01 NOTE — ED Notes (Signed)
Bedside report received from previous RN, Wilford Sports

## 2015-10-12 ENCOUNTER — Emergency Department (HOSPITAL_COMMUNITY)
Admission: EM | Admit: 2015-10-12 | Discharge: 2015-10-14 | Disposition: A | Discharge status: Other Acute Inpatient Hospital | Payer: Medicare Other | Attending: Emergency Medicine | Admitting: Emergency Medicine

## 2015-10-12 ENCOUNTER — Encounter (HOSPITAL_COMMUNITY): Payer: Self-pay | Admitting: *Deleted

## 2015-10-12 DIAGNOSIS — Z7982 Long term (current) use of aspirin: Secondary | ICD-10-CM | POA: Diagnosis not present

## 2015-10-12 DIAGNOSIS — Z049 Encounter for examination and observation for unspecified reason: Secondary | ICD-10-CM

## 2015-10-12 DIAGNOSIS — I10 Essential (primary) hypertension: Secondary | ICD-10-CM | POA: Diagnosis not present

## 2015-10-12 DIAGNOSIS — Z8639 Personal history of other endocrine, nutritional and metabolic disease: Secondary | ICD-10-CM | POA: Diagnosis not present

## 2015-10-12 DIAGNOSIS — R440 Auditory hallucinations: Secondary | ICD-10-CM | POA: Insufficient documentation

## 2015-10-12 DIAGNOSIS — F0391 Unspecified dementia with behavioral disturbance: Secondary | ICD-10-CM | POA: Diagnosis not present

## 2015-10-12 DIAGNOSIS — Z8546 Personal history of malignant neoplasm of prostate: Secondary | ICD-10-CM | POA: Diagnosis not present

## 2015-10-12 DIAGNOSIS — R45851 Suicidal ideations: Secondary | ICD-10-CM | POA: Diagnosis not present

## 2015-10-12 DIAGNOSIS — F03918 Unspecified dementia, unspecified severity, with other behavioral disturbance: Secondary | ICD-10-CM

## 2015-10-12 DIAGNOSIS — M1990 Unspecified osteoarthritis, unspecified site: Secondary | ICD-10-CM | POA: Diagnosis not present

## 2015-10-12 DIAGNOSIS — Z79899 Other long term (current) drug therapy: Secondary | ICD-10-CM | POA: Insufficient documentation

## 2015-10-12 DIAGNOSIS — Z86018 Personal history of other benign neoplasm: Secondary | ICD-10-CM | POA: Diagnosis not present

## 2015-10-12 DIAGNOSIS — Z9889 Other specified postprocedural states: Secondary | ICD-10-CM | POA: Diagnosis not present

## 2015-10-12 DIAGNOSIS — F329 Major depressive disorder, single episode, unspecified: Secondary | ICD-10-CM | POA: Diagnosis not present

## 2015-10-12 DIAGNOSIS — I252 Old myocardial infarction: Secondary | ICD-10-CM | POA: Diagnosis not present

## 2015-10-12 DIAGNOSIS — I2510 Atherosclerotic heart disease of native coronary artery without angina pectoris: Secondary | ICD-10-CM | POA: Diagnosis not present

## 2015-10-12 DIAGNOSIS — Z87891 Personal history of nicotine dependence: Secondary | ICD-10-CM | POA: Insufficient documentation

## 2015-10-12 DIAGNOSIS — Z862 Personal history of diseases of the blood and blood-forming organs and certain disorders involving the immune mechanism: Secondary | ICD-10-CM | POA: Insufficient documentation

## 2015-10-12 DIAGNOSIS — Z8711 Personal history of peptic ulcer disease: Secondary | ICD-10-CM | POA: Diagnosis not present

## 2015-10-12 DIAGNOSIS — Z046 Encounter for general psychiatric examination, requested by authority: Secondary | ICD-10-CM | POA: Diagnosis present

## 2015-10-12 DIAGNOSIS — Z8719 Personal history of other diseases of the digestive system: Secondary | ICD-10-CM | POA: Diagnosis not present

## 2015-10-12 LAB — COMPREHENSIVE METABOLIC PANEL
ALT: 31 U/L (ref 17–63)
AST: 26 U/L (ref 15–41)
Albumin: 4.4 g/dL (ref 3.5–5.0)
Alkaline Phosphatase: 82 U/L (ref 38–126)
Anion gap: 9 (ref 5–15)
BUN: 15 mg/dL (ref 6–20)
CO2: 25 mmol/L (ref 22–32)
Calcium: 9.1 mg/dL (ref 8.9–10.3)
Chloride: 103 mmol/L (ref 101–111)
Creatinine, Ser: 1.21 mg/dL (ref 0.61–1.24)
GFR calc Af Amer: >60 mL/min (ref >60)
GFR calc non Af Amer: 58 mL/min — ABNORMAL LOW (ref >60)
Glucose, Bld: 100 mg/dL — ABNORMAL HIGH (ref 65–99)
Potassium: 4.3 mmol/L (ref 3.5–5.1)
Sodium: 137 mmol/L (ref 135–145)
Total Bilirubin: 0.6 mg/dL (ref 0.3–1.2)
Total Protein: 8 g/dL (ref 6.5–8.1)

## 2015-10-12 LAB — RAPID URINE DRUG SCREEN, HOSP PERFORMED
Amphetamines: NOT DETECTED
Barbiturates: NOT DETECTED
Benzodiazepines: NOT DETECTED
Cocaine: NOT DETECTED
Opiates: NOT DETECTED
Tetrahydrocannabinol: NOT DETECTED

## 2015-10-12 LAB — CBC
HCT: 44.5 % (ref 39.0–52.0)
Hemoglobin: 14.7 g/dL (ref 13.0–17.0)
MCH: 28.1 pg (ref 26.0–34.0)
MCHC: 33 g/dL (ref 30.0–36.0)
MCV: 84.9 fL (ref 78.0–100.0)
Platelets: 246 10*3/uL (ref 150–400)
RBC: 5.24 MIL/uL (ref 4.22–5.81)
RDW: 14.9 % (ref 11.5–15.5)
WBC: 6.6 10*3/uL (ref 4.0–10.5)

## 2015-10-12 LAB — SALICYLATE LEVEL: Salicylate Lvl: <4 mg/dL (ref 2.8–30.0)

## 2015-10-12 LAB — ACETAMINOPHEN LEVEL: Acetaminophen (Tylenol), Serum: <10 ug/mL — ABNORMAL LOW (ref 10–30)

## 2015-10-12 LAB — ETHANOL: Alcohol, Ethyl (B): <5 mg/dL (ref <5)

## 2015-10-12 MED ORDER — ALUM & MAG HYDROXIDE-SIMETH 200-200-20 MG/5ML PO SUSP: 30.0000 mL | ORAL | Status: DC | PRN

## 2015-10-12 MED ORDER — LORAZEPAM 1 MG PO TABS: 1.0000 mg | ORAL_TABLET | Freq: Three times a day (TID) | ORAL | Status: DC | PRN

## 2015-10-12 MED ORDER — ACETAMINOPHEN 325 MG PO TABS: 650.0000 mg | ORAL_TABLET | ORAL | Status: DC | PRN

## 2015-10-12 NOTE — ED Provider Notes (Signed)
CSN: [id number]     Arrival date & time 10/12/15  1448 History  By signing my name below, I, Anthony Skinner, attest that this documentation has been prepared under the direction and in the presence of non-physician practitioner, Waynetta Pean, PA-C. Electronically Signed: Evelene Skinner, Scribe. 10/12/2015. 3:10 PM.  Chief Complaint  Patient presents with  . Med Clearance, IVC    The history is provided by the patient. No language interpreter was used.    HPI Comments:  Anthony Skinner is a 74 y.o. male who presents to the Emergency Department under IVC by [employer]. Pt has a history of suicide attempt by hanging ~ 2 years ago. At that time he was depressed and hearing voices and hung himself.   Today he was trying to put an extra cord on a leaf blower and wife thought he was going to hang himself again and filled out IVC paperwork. Pt states he is slightly depressed at this time but denies SI, HI or hallucinations. Pt sees a psychiatrist and is compliant with meds. Pt has no physical complaints or symptoms at this time; denies nausea, vomiting, dysuria, hematuria, HA, vision changes, CP and SOB. No modifying factors noted.  Psychiatrist: Dr. Rebbeca Paul  Past Medical History  Diagnosis Date  . Iron deficiency anemia   . Tubular adenoma of colon 2012  . Gastropathy 2012    reactive  . PUD (peptic ulcer disease)   . Prostate cancer ([place])     a. 09/2008 s/p prostatectomy.  . CAD (coronary artery disease)     a. reported h/o MI in the 29's;  b. 04/2000 Cath: LM nl, LAD 40p, D1 small, nl, RI nl, LCX nl, RCA nl.  . Hyperlipidemia   . Vertebral artery stenosis     a. 09/2010 s/p L vertebral stenting 09/2010.  Marland Kitchen GERD (gastroesophageal reflux disease)   . Hiatal hernia   . Recurrent spontaneous pneumothorax     a. s/p L lobectomy in 1966.  Marland Kitchen DJD (degenerative joint disease)   . Hypertension   . Upper GI bleed     a. 2012  . Fatty liver   . Hypertension   . Syncope     a. in setting of GIB in 2012,  presumed to be orthostatic.  . Suicide attempt ([place])     a. 08/2013 attempt by hanging with subsequent resp failure  . Myocardial infarction ([place])     " BACK IN THE 90'S"  . Dementia   . Shortness of breath    Past Surgical History  Procedure Laterality Date  . Prostatectomy    . Vertebral artery stent    . Lung removal, partial  1960s    left  . Cataract extraction Right   . Cardiac surgery    . Cardiac catheterization  09/15/2013  . Left heart catheterization with coronary angiogram N/A 09/15/2013    Procedure: LEFT HEART CATHETERIZATION WITH CORONARY ANGIOGRAM;  Surgeon: Burnell Blanks, MD;  Location: [facility] CATH LAB;  Service: Cardiovascular;  Laterality: N/A;   Family History  Problem Relation Age of Onset  . Heart attack Father   . Alzheimer's disease Mother   . Prostate cancer Brother    Social History  Substance Use Topics  . Smoking status: Former Smoker -- 1.00 packs/day for 40 years    Types: Cigarettes    Quit date: 08/27/2002  . Smokeless tobacco: Never Used  . Alcohol Use: No     Comment: Hx heavy EtOH use but quit 2011  Review of Systems  Eyes: Negative for visual disturbance.  Respiratory: Negative for shortness of breath.   Cardiovascular: Negative for chest pain.  Gastrointestinal: Negative for nausea and vomiting.  Genitourinary: Negative for dysuria and hematuria.  Neurological: Negative for headaches.  Psychiatric/Behavioral: Negative for suicidal ideas, hallucinations and self-injury.       + Depression  All other systems reviewed and are negative.   Allergies  Review of patient's allergies indicates no known allergies.  Home Medications   Prior to Admission medications   Medication Sig Start Date End Date Taking? Authorizing Provider  aspirin 81 MG tablet Take 81 mg by mouth daily.    Historical Provider, MD  memantine (NAMENDA) 5 MG tablet Take 5 mg by mouth every morning.  05/30/15   Historical Provider, MD  mirtazapine (REMERON) 30  MG tablet Take 30 mg by mouth daily.  05/30/15   Historical Provider, MD  risperiDONE (RISPERDAL) 0.5 MG tablet Take 1 tablet (0.5 mg total) by mouth at bedtime. 06/13/15   Delfin Gant, NP  sertraline (ZOLOFT) 50 MG tablet Take 1 tablet (50 mg total) by mouth daily. 09/02/15   Delfin Gant, NP   BP 160/79 mmHg  Pulse 84  Temp(Src) 97.9 F (36.6 C) (Oral)  Resp 16  SpO2 100% Physical Exam  Constitutional: He appears well-developed and well-nourished. No distress.  HENT:  Head: Normocephalic and atraumatic.  Eyes: Conjunctivae are normal. Pupils are equal, round, and reactive to light. Right eye exhibits no discharge. Left eye exhibits no discharge.  Neck: Neck supple.  Cardiovascular: Normal rate, regular rhythm, normal heart sounds and intact distal pulses.   Pulmonary/Chest: Effort normal and breath sounds normal. No respiratory distress.  Abdominal: Soft. There is no tenderness.  Lymphadenopathy:    He has no cervical adenopathy.  Neurological: He is alert. Coordination normal.  Skin: Skin is warm and dry. No rash noted. He is not diaphoretic.  Psychiatric: He has a normal mood and affect. His speech is normal and behavior is normal. Thought content normal. He is not actively hallucinating. Thought content is not paranoid. He expresses no homicidal and no suicidal ideation.  Patient denies suicidal or homicidal ideations. He does endorse feeling slightly depressed but denies any thoughts of wanting to harm himself recently. Patient has good eye contact. He denies visual or auditory hallucinations. He denies thought insertion, thought withdrawal or thought broadcasting.  Nursing note and vitals reviewed.   ED Course  Procedures   DIAGNOSTIC STUDIES:  Oxygen Saturation is 100% on RA, normal by my interpretation.    COORDINATION OF CARE:  3:07 PM Will clear pt medically through blood work and have behavioral health assess pt.  Discussed treatment plan with pt at bedside  and pt agreed to plan.  Labs Review Labs Reviewed  COMPREHENSIVE METABOLIC PANEL - Abnormal; Notable for the following:    Glucose, Bld 100 (*)    GFR calc non Af Amer 58 (*)    All other components within normal limits  ACETAMINOPHEN LEVEL - Abnormal; Notable for the following:    Acetaminophen (Tylenol), Serum <10 (*)    All other components within normal limits  ETHANOL  SALICYLATE LEVEL  CBC  URINE RAPID DRUG SCREEN, HOSP PERFORMED    Imaging Review No results found. I have personally reviewed and evaluated these images and lab results as part of my medical decision-making.   EKG Interpretation None      Filed Vitals:   10/12/15 1449  BP: 160/79  Pulse: 84  Temp: 97.9 F (36.6 C)  TempSrc: Oral  Resp: 16  SpO2: 100%     MDM   Meds given in ED:  Medications  alum & mag hydroxide-simeth (MAALOX/MYLANTA) 200-200-20 MG/5ML suspension 30 mL (not administered)  acetaminophen (TYLENOL) tablet 650 mg (not administered)  LORazepam (ATIVAN) tablet 1 mg (not administered)    New Prescriptions   No medications on file    Final diagnoses:  Suicidal ideation   This is a 74 y.o. male who presents to the Emergency Department under IVC by [employer]. Pt has a history of suicide attempt by hanging ~ 2 years ago. At that time he was depressed and hearing voices and hung himself.   Today he was trying to put an extra cord on a leaf blower and wife thought he was going to hang himself again and filled out IVC paperwork. Pt states he is slightly depressed at this time but denies SI, HI or hallucinations. On exam patient does appear slightly depressed but denies suicidal or homicidal ideations to me. He has no physical complaints. Will consult behavioral health for evaluation. Patient's urine drug screen is negative. CMP is unremarkable. Ethanol, salicylate and acetaminophen levels are negative. CBC is unremarkable. Patient is medically clear for Behavioral Health disposition. Behavioral  Health evaluated the patient and requested the patient stay overnight in the emergency department for repeat evaluation by psychiatry in the morning. Psychiatric holding orders placed for this patient.  I personally performed the services described in this documentation, which was scribed in my presence. The recorded information has been reviewed and is accurate.       Waynetta Pean, PA-C 10/12/15 1651  Milton Ferguson, MD 10/16/15 [phone]

## 2015-10-12 NOTE — ED Notes (Signed)
Patient pleasant and cooperative. Denies SI/HI and pain. Q 15 minute checks in progress. Monitored for safety.

## 2015-10-12 NOTE — BH Assessment (Addendum)
Assessment Note  Anthony Skinner is an 74 y.o. male who is reported by self  to be in the beginning stages of alzheimer's dementia, brought to ED by [employer]. Patient was picked up by [employer] from his residence. Pt reports his wife filing IVC papers on him. Patent reported that today he was blowing the leaves off his porch with a leaf blower. The blower stopped working for a unknown reason. Patient grabbed the other leaf blower to finish the job. Per patient his spouse assumed that he was grabbing the other leaf blower to make a nuse to hang himself. Patient denies this intent. He denies current or recent thoughts of SI, HI, and AVH's. Patient does admit to some feeling of depression after being diagnosed with early stages of dementia. He feels that after being diagnosed with dementia his spouse started treating him like a baby and wouldn't allow him to be independent. Pt sts, "My wife sts all the time...you can't do this and you can't do that". Patient does have a history of 1 prior suicide in 2014 and he was hospitalized at [facility]. Since his hospitalization at [facility] patient has not experienced any further thoughts of SI. Patient has been previously hospitalized at [facility] and [facility].  Per IVC papers taken out by patient's spouse: "Pt has been diagnosed with depression and other unspecified mental illness. Pt has been committed before previous suicide attempt. He has been prescribed medication for his mental health issue and he is currently complaint with his meds. His wife believes that pt attempted to gather items in order to hang himself today. police were dispatched and pt was transported to hospital for evaluation and police informed wife to take out IVC papers on pt."  Patient's spouse is wife Landyn Buckalew, with verbal consent of pt to obtain information. Per Theodore Demark [phone]. Per spouse, patient had a appointment with his psychiatrist. Patient asked the the  psychiatrist-Dr. Judd Gaudier, "Please put me somewhere because sometime I feel like I'm loosing it". Dr. Judd Gaudier however advised patient not to go in his shed. According to his spouse this is where patient attempted to hang himself in the past. Patient agreed to follow Dr. Dorene Grebe orders and left the appointment in "good spirits". Despite the doctors orders to stay away from the shed patient insisted on blowing the leaves off his porch with a leaf blower. Per spouse, "I couldn't stop him so I left him do it".  The wife checked on her spouse and found him on the porce with "cords everywhere". She became afraid that patient would try to commit suicide again and called 911.      Diagnosis: Dementia with behavioral disturbance  Past Medical History:  Past Medical History  Diagnosis Date  . Iron deficiency anemia   . Tubular adenoma of colon 2012  . Gastropathy 2012    reactive  . PUD (peptic ulcer disease)   . Prostate cancer ([place])     a. 09/2008 s/p prostatectomy.  . CAD (coronary artery disease)     a. reported h/o MI in the 67's;  b. 04/2000 Cath: LM nl, LAD 40p, D1 small, nl, RI nl, LCX nl, RCA nl.  . Hyperlipidemia   . Vertebral artery stenosis     a. 09/2010 s/p L vertebral stenting 09/2010.  Marland Kitchen GERD (gastroesophageal reflux disease)   . Hiatal hernia   . Recurrent spontaneous pneumothorax     a. s/p L lobectomy in 1966.  Marland Kitchen DJD (degenerative joint  disease)   . Hypertension   . Upper GI bleed     a. 2012  . Fatty liver   . Hypertension   . Syncope     a. in setting of GIB in 2012, presumed to be orthostatic.  . Suicide attempt ([place])     a. 08/2013 attempt by hanging with subsequent resp failure  . Myocardial infarction ([place])     " BACK IN THE 90'S"  . Dementia   . Shortness of breath     Past Surgical History  Procedure Laterality Date  . Prostatectomy    . Vertebral artery stent    . Lung removal, partial  1960s    left  . Cataract extraction Right   . Cardiac surgery    .  Cardiac catheterization  09/15/2013  . Left heart catheterization with coronary angiogram N/A 09/15/2013    Procedure: LEFT HEART CATHETERIZATION WITH CORONARY ANGIOGRAM;  Surgeon: Burnell Blanks, MD;  Location: [facility] CATH LAB;  Service: Cardiovascular;  Laterality: N/A;    Family History:  Family History  Problem Relation Age of Onset  . Heart attack Father   . Alzheimer's disease Mother   . Prostate cancer Brother     Social History:  reports that he quit smoking about 13 years ago. His smoking use included Cigarettes. He has a 40 pack-year smoking history. He has never used smokeless tobacco. He reports that he does not drink alcohol or use illicit drugs.  Additional Social History:  Alcohol / Drug Use Pain Medications: SEE MAR Prescriptions: SEE MAR Over the Counter: SEE MAR History of alcohol / drug use?: No history of alcohol / drug abuse ("I quict drinking 6 yrs and smoking 16 yrs ago")  CIWA: CIWA-Ar BP: 160/79 mmHg Pulse Rate: 84 COWS:    Allergies: No Known Allergies  Home Medications:  (Not in a hospital admission)  OB/GYN Status:  No LMP for male patient.  General Assessment Data Location of Assessment: [facility] ED TTS Assessment: In system Is this a Tele or Face-to-Face Assessment?: Face-to-Face Is this an Initial Assessment or a Re-assessment for this encounter?: Initial Assessment Marital status: Married [place] name:  (n/a) Is patient pregnant?: No Pregnancy Status: No Living Arrangements: Spouse/significant other Can pt return to current living arrangement?: No Admission Status: Involuntary Is patient capable of signing voluntary admission?: Yes Referral Source: Self/Family/Friend Insurance type:  (Medicare)  Medical Screening Exam ([place]) Medical Exam completed: Yes  Crisis Care Plan Living Arrangements: Spouse/significant other Name of Psychiatrist: Dr.  Zeb Comfort Name of Therapist: None  Education Status Is patient currently in  school?: No Current Grade:  (n/a) Highest grade of school patient has completed: N/A Name of school: N/A Contact person: N/A  Risk to self with the past 6 months Suicidal Ideation: No Has patient been a risk to self within the past 6 months prior to admission? : No Suicidal Intent: No Has patient had any suicidal intent within the past 6 months prior to admission? : No Is patient at risk for suicide?: No Suicidal Plan?: No Has patient had any suicidal plan within the past 6 months prior to admission? : No Access to Means: No What has been your use of drugs/alcohol within the last 12 months?:  (patient has not had any alcohol in 6 yrs ) Previous Attempts/Gestures: Yes How many times?:  (1x) Other Self Harm Risks:  (None reported) Triggers for Past Attempts: Hallucinations (voices telling me I am worthless) Intentional Self Injurious Behavior: None Family  Suicide History: Unknown Recent stressful life event(s): Other (Comment) (dx's with early stages of dementia ) Persecutory voices/beliefs?: No Depression: No Depression Symptoms: Feeling angry/irritable, Loss of interest in usual pleasures, Feeling worthless/self pity, Guilt, Fatigue, Tearfulness, Isolating, Insomnia, Despondent Substance abuse history and/or treatment for substance abuse?: No Suicide prevention information given to non-admitted patients: Not applicable  Risk to Others within the past 6 months Homicidal Ideation: No Does patient have any lifetime risk of violence toward others beyond the six months prior to admission? : No Thoughts of Harm to Others: No Current Homicidal Intent: No Current Homicidal Plan: No Access to Homicidal Means: No Identified Victim:  (n/a) History of harm to others?: No Assessment of Violence: None Noted Violent Behavior Description:  (patient is calm and cooperative) Does patient have access to weapons?: No Criminal Charges Pending?: No Does patient have a court date: No Is patient  on probation?: No  Psychosis Hallucinations: None noted Delusions: None noted  Mental Status Report Appearance/Hygiene: Unremarkable Eye Contact: Fair Motor Activity: Restlessness Speech: Logical/coherent Level of Consciousness: Alert Mood: Depressed Affect: Depressed, Anxious Anxiety Level: Minimal Thought Processes: Coherent Judgement: Partial Orientation: Person, Place, Situation, Time Obsessive Compulsive Thoughts/Behaviors: None  Cognitive Functioning Concentration: Decreased Memory: Recent Intact, Remote Intact IQ: Average Insight: Fair Impulse Control: Fair Appetite: Fair Weight Loss:  (50 pounds) Weight Gain:  (0) Sleep: Decreased Total Hours of Sleep:  (5 hrs of sleep) Vegetative Symptoms: None  ADLScreening [facility] Assessment Services) Patient's cognitive ability adequate to safely complete daily activities?: No Patient able to express need for assistance with ADLs?: Yes Independently performs ADLs?: Yes (appropriate for developmental age)  Prior Inpatient Therapy Prior Inpatient Therapy: Yes Prior Therapy Dates: July 2016 Prior Therapy Facilty/Provider(s): [facility] Reason for Treatment: Depression  Prior Outpatient Therapy Prior Outpatient Therapy: Yes Prior Therapy Dates: Current Prior Therapy Facilty/Provider(s): Dr. Zeb Comfort Reason for Treatment: Depression and dementia Does patient have an ACCT team?: No Does patient have Intensive In-House Services?  : No Does patient have [facility] services? : No Does patient have P4CC services?: No  ADL Screening (condition at time of admission) Patient's cognitive ability adequate to safely complete daily activities?: No Is the patient deaf or have difficulty hearing?: No Does the patient have difficulty seeing, even when wearing glasses/contacts?: Yes Does the patient have difficulty concentrating, remembering, or making decisions?: No Patient able to express need for assistance with ADLs?:  Yes Does the patient have difficulty dressing or bathing?: No Independently performs ADLs?: Yes (appropriate for developmental age) Does the patient have difficulty walking or climbing stairs?: No Weakness of Legs: None Weakness of Arms/Hands: None  Home Assistive Devices/Equipment Home Assistive Devices/Equipment: None    Abuse/Neglect Assessment (Assessment to be complete while patient is alone) Physical Abuse: Denies Verbal Abuse: Denies Sexual Abuse: Denies Exploitation of patient/patient's resources: Denies Self-Neglect: Denies Values / Beliefs Cultural Requests During Hospitalization: None Spiritual Requests During Hospitalization: None   Advance Directives (For Healthcare) Does patient have an advance directive?: No    Additional Information 1:1 In Past 12 Months?: No CIRT Risk: No Elopement Risk: No Does patient have medical clearance?: Yes     Disposition:  Disposition Initial Assessment Completed for this Encounter: Yes   Per Waylan Boga, NP patient to remain in the ED overnight. Psychiatry will re-evaluate in the am.   On Site Evaluation by:   Reviewed with Physician:    Waldon Merl [facility] 10/12/2015 3:29 PM

## 2015-10-12 NOTE — ED Notes (Signed)
Pt oriented to room and unit.  Pt denies SI,HI and AVH.  Pt claims he is a little depressed because his family wont let him do all the things he wants to do.  He is pleasant and cooperative.  15 minute checks and video monitoring in place.

## 2015-10-12 NOTE — ED Notes (Signed)
ACT team at bedside.  

## 2015-10-12 NOTE — ED Notes (Addendum)
Pt reports he attempted suicide by hanging 2 years ago. But no SI attempts since then  Wife took out IVC paperwork, states "pt has been diagnosed with depression and other unspecified mental illness. Pt has been committed before previous suicide attempt. He has been prescribed medication for his mental health issue and he is currently complaint with his meds. His wife believes that pt attempted to gather items in order to hang himself today. police were dispatched and pt was transported to hospital for evaluation and police informed wife to take out IVC papers on pt."  Upon rn assessment, pt reports that he did try to hang himself 2 years ago, was expected to have brain damage, but didn't, said that he was very depressed at that time and thought his family would be better without him, pt said it was a demon in him, and that he is good now and "is with the Reita Cliche and got the demon out". Pt reports he sees a psychiatrist and takes medications. Denies SI/HI, AH/VH.   Pt reports that he was trying to make an extra extension cord for his leaf blower. Pt did not think about that he was in the room that he had previously attempted to kill himself. Wife saw pt in that room with lots of extension cord and thought he was going to try to kill himself again. Pt reports his wife loves him, they have been married 31 years. Pt reports he quit drink ETOH 6 years ago, quit smoking 16 years ago. Denies illegal drug use.

## 2015-10-13 ENCOUNTER — Emergency Department (HOSPITAL_COMMUNITY): Payer: Medicare Other

## 2015-10-13 DIAGNOSIS — F0391 Unspecified dementia with behavioral disturbance: Secondary | ICD-10-CM | POA: Diagnosis not present

## 2015-10-13 DIAGNOSIS — R45851 Suicidal ideations: Secondary | ICD-10-CM | POA: Diagnosis not present

## 2015-10-13 MED ORDER — RISPERIDONE 0.5 MG PO TABS
0.5000 mg | ORAL_TABLET | Freq: Every day | ORAL | Status: DC
  Administered 2015-10-13: 0.5 mg via ORAL
  Filled 2015-10-13: qty 1

## 2015-10-13 MED ORDER — SERTRALINE HCL 50 MG PO TABS
50.0000 mg | ORAL_TABLET | Freq: Every day | ORAL | Status: DC
  Administered 2015-10-13: 50 mg via ORAL
  Filled 2015-10-13: qty 1

## 2015-10-13 MED ORDER — ASPIRIN 81 MG PO CHEW
81.0000 mg | CHEWABLE_TABLET | Freq: Every day | ORAL | Status: DC
  Administered 2015-10-13: 81 mg via ORAL
  Filled 2015-10-13: qty 1

## 2015-10-13 MED ORDER — MIRTAZAPINE 30 MG PO TABS
30.0000 mg | ORAL_TABLET | Freq: Every day | ORAL | Status: DC
  Administered 2015-10-13: 30 mg via ORAL
  Filled 2015-10-13: qty 1

## 2015-10-13 MED ORDER — LAMOTRIGINE 100 MG PO TABS
100.0000 mg | ORAL_TABLET | Freq: Two times a day (BID) | ORAL | Status: DC
  Administered 2015-10-13 (×2): 100 mg via ORAL
  Filled 2015-10-13 (×3): qty 1

## 2015-10-13 NOTE — ED Notes (Signed)
Patient in room on stretcher.  Calm and cooperative.  Denies SI/HI states he does not know why he is going to [place].  Patient alert and orient x3

## 2015-10-13 NOTE — ED Notes (Signed)
Left message with Sgt Paschal, sheriff transport to call back

## 2015-10-13 NOTE — ED Notes (Signed)
Spoke with pt's wife, she would like to speak with MD concerning the patient, pt's wife took out IVC on pt and she states that the patient "tried to hang himself with cords" 2 years ago and wife is very concerned because she found him in a room and he had several cords out, she is worried that he ws trying to make something to hang himself with again, wife states that he denied it when she asked about it.

## 2015-10-13 NOTE — Consult Note (Signed)
[place] Psychiatry Consult   Reason for Consult:  Depression Referring Physician:  EDP Patient Identification: Anthony Skinner MRN:  [id number] Principal Diagnosis: Dementia with behavioral disturbance Diagnosis:   Patient Active Problem List   Diagnosis Date Noted  . Dementia with behavioral disturbance [F03.91] 09/01/2015    Priority: High  . Major depressive disorder without psychotic features ([place]) [F32.9] 05/24/2015    Priority: High  . Adjustment disorder with disturbance of emotion [F43.29] 04/23/2015    Priority: High  . Aggressive behavior [F60.89]     Priority: High  . Dementia [F03.90]   . Chest pain [R07.9] 09/15/2013  . Coronary atherosclerosis of native coronary artery [I25.10] 09/15/2013  . Hypokalemia [E87.6] 08/28/2013  . Acute respiratory failure ([place]) [J96.00] 08/25/2013  . Altered mental status [R41.82] 08/25/2013  . Anoxic brain injury ([place]) [G93.1] 08/25/2013  . HTN (hypertension) [I10] 08/25/2013  . Suicide attempt ([place]) [T14.91] 08/25/2013  . Vitamin B 12 deficiency [E53.8] 07/15/2013  . Unspecified hereditary and idiopathic peripheral neuropathy [G60.9] 07/15/2013  . Memory loss [R41.3] 07/15/2013    Total Time spent with patient: 45 minutes  Subjective:   Anthony Skinner is a 74 y.o. male patient admitted with depression, suicidal ideations.  HPI:  74 y.o. male who is reported by self to be in the beginning stages of alzheimer's dementia, brought to ED by [employer]. Patient was picked up by [employer] from his residence. Pt reports his wife filing IVC papers on him. Patent reported that today he was blowing the leaves off his porch with a leaf blower. The blower stopped working for a unknown reason. Patient grabbed the other leaf blower to finish the job. Per patient his spouse assumed that he was grabbing the other leaf blower to make a nuse to hang himself. Patient denies this intent. He denies current or recent thoughts of SI, HI, and AVH's. Patient does  admit to some feeling of depression after being diagnosed with early stages of dementia. He feels that after being diagnosed with dementia his spouse started treating him like a baby and wouldn't allow him to be independent. Pt sts, "My wife sts all the time...you can't do this and you can't do that". Patient does have a history of 1 prior suicide in 2014 and he was hospitalized at [facility]. Since his hospitalization at [facility] patient has not experienced any further thoughts of SI. Patient has been previously hospitalized at [facility] and [place].  Per IVC papers taken out by patient's spouse: "Pt has been diagnosed with depression and other unspecified mental illness. Pt has been committed before previous suicide attempt. He has been prescribed medication for his mental health issue and he is currently complaint with his meds. His wife believes that pt attempted to gather items in order to hang himself today. police were dispatched and pt was transported to hospital for evaluation and police informed wife to take out IVC papers on pt."  Patient's spouse is wife Hy Swiatek, with verbal consent of pt to obtain information. Per Theodore Demark [phone]. Per spouse, patient had a appointment with his psychiatrist. Patient asked the the psychiatrist-Dr. Judd Gaudier, "Please put me somewhere because sometime I feel like I'm loosing it". Dr. Judd Gaudier however advised patient not to go in his shed. According to his spouse this is where patient attempted to hang himself in the past. Patient agreed to follow Dr. Dorene Grebe orders and left the appointment in "good spirits". Despite the doctors orders to stay away from the  shed patient insisted on blowing the leaves off his porch with a leaf blower. Per spouse, "I couldn't stop him so I left him do it". The wife checked on her spouse and found him on the porce with "cords everywhere". She became afraid that patient would try to commit  suicide again and called 911.  On assessment:  Patient admits to depression and bothered by his recent diagnosis of phase I Alzheimer's Disease.  Agreeable to get assistance for his depression and stressors.  Past Psychiatric History: depression, suicide attempt  Risk to Self: Suicidal Ideation: No Suicidal Intent: No Is patient at risk for suicide?: No Suicidal Plan?: No Access to Means: No What has been your use of drugs/alcohol within the last 12 months?:  (patient has not had any alcohol in 6 yrs ) How many times?:  (1x) Other Self Harm Risks:  (None reported) Triggers for Past Attempts: Hallucinations (voices telling me I am worthless) Intentional Self Injurious Behavior: None Risk to Others: Homicidal Ideation: No Thoughts of Harm to Others: No Current Homicidal Intent: No Current Homicidal Plan: No Access to Homicidal Means: No Identified Victim:  (n/a) History of harm to others?: No Assessment of Violence: None Noted Violent Behavior Description:  (patient is calm and cooperative) Does patient have access to weapons?: No Criminal Charges Pending?: No Does patient have a court date: No Prior Inpatient Therapy: Prior Inpatient Therapy: Yes Prior Therapy Dates: July 2016 Prior Therapy Facilty/Provider(s): [facility] Reason for Treatment: Depression Prior Outpatient Therapy: Prior Outpatient Therapy: Yes Prior Therapy Dates: Current Prior Therapy Facilty/Provider(s): Dr. Zeb Comfort Reason for Treatment: Depression and dementia Does patient have an ACCT team?: No Does patient have Intensive In-House Services?  : No Does patient have [facility] services? : No Does patient have P4CC services?: No  Past Medical History:  Past Medical History  Diagnosis Date  . Iron deficiency anemia   . Tubular adenoma of colon 2012  . Gastropathy 2012    reactive  . PUD (peptic ulcer disease)   . Prostate cancer ([place])     a. 09/2008 s/p prostatectomy.  . CAD (coronary artery  disease)     a. reported h/o MI in the 1's;  b. 04/2000 Cath: LM nl, LAD 40p, D1 small, nl, RI nl, LCX nl, RCA nl.  . Hyperlipidemia   . Vertebral artery stenosis     a. 09/2010 s/p L vertebral stenting 09/2010.  Marland Kitchen GERD (gastroesophageal reflux disease)   . Hiatal hernia   . Recurrent spontaneous pneumothorax     a. s/p L lobectomy in 1966.  Marland Kitchen DJD (degenerative joint disease)   . Hypertension   . Upper GI bleed     a. 2012  . Fatty liver   . Hypertension   . Syncope     a. in setting of GIB in 2012, presumed to be orthostatic.  . Suicide attempt ([place])     a. 08/2013 attempt by hanging with subsequent resp failure  . Myocardial infarction ([place])     " BACK IN THE 90'S"  . Dementia   . Shortness of breath     Past Surgical History  Procedure Laterality Date  . Prostatectomy    . Vertebral artery stent    . Lung removal, partial  1960s    left  . Cataract extraction Right   . Cardiac surgery    . Cardiac catheterization  09/15/2013  . Left heart catheterization with coronary angiogram N/A 09/15/2013    Procedure: LEFT HEART CATHETERIZATION WITH  CORONARY ANGIOGRAM;  Surgeon: Burnell Blanks, MD;  Location: [facility] CATH LAB;  Service: Cardiovascular;  Laterality: N/A;   Family History:  Family History  Problem Relation Age of Onset  . Heart attack Father   . Alzheimer's disease Mother   . Prostate cancer Brother    Family Psychiatric  History: None Social History:  History  Alcohol Use No    Comment: Hx heavy EtOH use but quit 2011     History  Drug Use No    Social History   Social History  . Marital Status: Married    Spouse Name: Terri Piedra  . Number of Children: 0  . Years of Education: 12th   Occupational History  . retired    Social History Main Topics  . Smoking status: Former Smoker -- 1.00 packs/day for 40 years    Types: Cigarettes    Quit date: 08/27/2002  . Smokeless tobacco: Never Used  . Alcohol Use: No     Comment: Hx heavy EtOH use but quit  2011  . Drug Use: No  . Sexual Activity: Not Asked   Other Topics Concern  . None   Social History Narrative   Lives in [place] with wife.  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   Caffeine Use: 4 cups daily       Additional Social History:    Pain Medications: SEE MAR Prescriptions: SEE MAR Over the Counter: SEE MAR History of alcohol / drug use?: No history of alcohol / drug abuse ("I quict drinking 6 yrs and smoking 16 yrs ago")                     Allergies:  No Known Allergies  Labs:  Results for orders placed or performed during the hospital encounter of 10/12/15 (from the past 48 hour(s))  Comprehensive metabolic panel     Status: Abnormal   Collection Time: 10/12/15  3:18 PM  Result Value Ref Range   Sodium 137 135 - 145 mmol/L   Potassium 4.3 3.5 - 5.1 mmol/L   Chloride 103 101 - 111 mmol/L   CO2 25 22 - 32 mmol/L   Glucose, Bld 100 (H) 65 - 99 mg/dL   BUN 15 6 - 20 mg/dL   Creatinine, Ser 1.21 0.61 - 1.24 mg/dL   Calcium 9.1 8.9 - 10.3 mg/dL   Total Protein 8.0 6.5 - 8.1 g/dL   Albumin 4.4 3.5 - 5.0 g/dL   AST 26 15 - 41 U/L   ALT 31 17 - 63 U/L   Alkaline Phosphatase 82 38 - 126 U/L   Total Bilirubin 0.6 0.3 - 1.2 mg/dL   GFR calc non Af Amer 58 (L) >60 mL/min   GFR calc Af Amer >60 >60 mL/min    Comment: (NOTE) The eGFR has been calculated using the CKD EPI equation. This calculation has not been validated in all clinical situations. eGFR's persistently <60 mL/min signify possible Chronic Kidney Disease.    Anion gap 9 5 - 15  Ethanol (ETOH)     Status: None   Collection Time: 10/12/15  3:18 PM  Result Value Ref Range   Alcohol, Ethyl (B) <5 <5 mg/dL    Comment:        LOWEST DETECTABLE LIMIT FOR SERUM ALCOHOL IS 5 mg/dL FOR MEDICAL PURPOSES ONLY   Salicylate level     Status: None   Collection Time: 10/12/15  3:18 PM  Result Value Ref Range   Salicylate Lvl <5.4  2.8 - 30.0 mg/dL  Acetaminophen level     Status: Abnormal    Collection Time: 10/12/15  3:18 PM  Result Value Ref Range   Acetaminophen (Tylenol), Serum <10 (L) 10 - 30 ug/mL    Comment:        THERAPEUTIC CONCENTRATIONS VARY SIGNIFICANTLY. A RANGE OF 10-30 ug/mL MAY BE AN EFFECTIVE CONCENTRATION FOR MANY PATIENTS. HOWEVER, SOME ARE BEST TREATED AT CONCENTRATIONS OUTSIDE THIS RANGE. ACETAMINOPHEN CONCENTRATIONS >150 ug/mL AT 4 HOURS AFTER INGESTION AND >50 ug/mL AT 12 HOURS AFTER INGESTION ARE OFTEN ASSOCIATED WITH TOXIC REACTIONS.   CBC     Status: None   Collection Time: 10/12/15  3:18 PM  Result Value Ref Range   WBC 6.6 4.0 - 10.5 K/uL   RBC 5.24 4.22 - 5.81 MIL/uL   Hemoglobin 14.7 13.0 - 17.0 g/dL   HCT 44.5 39.0 - 52.0 %   MCV 84.9 78.0 - 100.0 fL   MCH 28.1 26.0 - 34.0 pg   MCHC 33.0 30.0 - 36.0 g/dL   RDW 14.9 11.5 - 15.5 %   Platelets 246 150 - 400 K/uL  Urine rapid drug screen (hosp performed) (Not at [facility])     Status: None   Collection Time: 10/12/15  3:51 PM  Result Value Ref Range   Opiates NONE DETECTED NONE DETECTED   Cocaine NONE DETECTED NONE DETECTED   Benzodiazepines NONE DETECTED NONE DETECTED   Amphetamines NONE DETECTED NONE DETECTED   Tetrahydrocannabinol NONE DETECTED NONE DETECTED   Barbiturates NONE DETECTED NONE DETECTED    Comment:        DRUG SCREEN FOR MEDICAL PURPOSES ONLY.  IF CONFIRMATION IS NEEDED FOR ANY PURPOSE, NOTIFY LAB WITHIN 5 DAYS.        LOWEST DETECTABLE LIMITS FOR URINE DRUG SCREEN Drug Class       Cutoff (ng/mL) Amphetamine      1000 Barbiturate      200 Benzodiazepine   794 Tricyclics       801 Opiates          300 Cocaine          300 THC              50     Current Facility-Administered Medications  Medication Dose Route Frequency Provider Last Rate Last Dose  . acetaminophen (TYLENOL) tablet 650 mg  650 mg Oral Q4H PRN Waynetta Pean, PA-C      . alum & mag hydroxide-simeth (MAALOX/MYLANTA) 200-200-20 MG/5ML suspension 30 mL  30 mL Oral PRN Waynetta Pean, PA-C       . LORazepam (ATIVAN) tablet 1 mg  1 mg Oral Q8H PRN Waynetta Pean, PA-C       Current Outpatient Prescriptions  Medication Sig Dispense Refill  . aspirin 81 MG tablet Take 81 mg by mouth daily.    . chlorproMAZINE (THORAZINE) 25 MG tablet Take 1 tablet by mouth 2 (two) times daily.  0  . lamoTRIgine (LAMICTAL) 100 MG tablet Take 100 mg by mouth 2 (two) times daily.  1  . mirtazapine (REMERON) 30 MG tablet Take 30 mg by mouth daily.   0  . risperiDONE (RISPERDAL) 0.5 MG tablet Take 1 tablet (0.5 mg total) by mouth at bedtime. 30 tablet 0  . sertraline (ZOLOFT) 50 MG tablet Take 1 tablet (50 mg total) by mouth daily. 50 tablet 0    Musculoskeletal: Strength & Muscle Tone: within normal limits Gait & Station: normal Patient leans: N/A  Psychiatric Specialty Exam:  Review of Systems  Constitutional: Negative.   HENT: Negative.   Eyes: Negative.   Respiratory: Negative.   Cardiovascular: Negative.   Gastrointestinal: Negative.   Genitourinary: Negative.   Musculoskeletal: Negative.   Skin: Negative.   Neurological: Negative.   Endo/Heme/Allergies: Negative.   Psychiatric/Behavioral: Positive for depression and suicidal ideas.    Blood pressure 146/65, pulse 83, temperature 98.1 F (36.7 C), temperature source Oral, resp. rate 17, SpO2 100 %.There is no weight on file to calculate BMI.  General Appearance: Casual  Eye Contact::  Good  Speech:  Normal Rate  Volume:  Normal  Mood:  Depressed  Affect:  Congruent  Thought Process:  Coherent  Orientation:  Full (Time, Place, and Person)  Thought Content:  Rumination  Suicidal Thoughts:  Yes.  without intent/plan  Homicidal Thoughts:  No  Memory:  Immediate;   Fair Recent;   Fair Remote;   Fair  Judgement:  Fair  Insight:  Fair  Psychomotor Activity:  Decreased  Concentration:  Fair  Recall:  [employer] of Knowledge:Fair  Language: Good  Akathisia:  No  Handed:  Right  AIMS (if indicated):     Assets:  Housing Leisure  Time Physical Health Resilience Social Support  ADL's:  Intact  Cognition: WNL  Sleep:      Treatment Plan Summary: Daily contact with patient to assess and evaluate symptoms and progress in treatment, Medication management and Plan dementia with behavioral disturbances; major depression, major, recurrent, severe  -Crisis stabilization -Medication management:  Lamictal 100 mg BID for mood stabilization, Remeron 30 mg at bedtime for sleep issues, Zoloft 50 mg daily for depression restarted.  Thorazine not restarted but Risperdal 0.5 mg daily for irritability started along with Ativan 1 mg every 8 hours PRN anxiety -Individual counseling  Disposition: Recommend psychiatric Inpatient admission when medically cleared.  Waylan Boga, [place] 10/13/2015 12:55 PM Patient seen face-to-face for psychiatric evaluation, chart reviewed and case discussed with the physician extender and developed treatment plan. Reviewed the information documented and agree with the treatment plan. Corena Pilgrim, MD

## 2015-10-13 NOTE — BH Assessment (Signed)
[place] Assessment Progress Note  The following facilities have been contacted to seek placement for this pt, with results as noted:  Beds available, information sent, decision pending:  [facility]  At capacity:  [facility], [place] Triage Specialist [phone]

## 2015-10-13 NOTE — BH Assessment (Signed)
[place] Assessment Progress Note Per Gerald Stabs, pt accepted to [facility] to bed 158-1 to Dr. Launa Grill, call report # is [phone]. Pt to be transported by sherrif.

## 2015-10-14 DIAGNOSIS — F331 Major depressive disorder, recurrent, moderate: Secondary | ICD-10-CM | POA: Insufficient documentation

## 2016-01-04 ENCOUNTER — Emergency Department (HOSPITAL_COMMUNITY)
Admission: EM | Admit: 2016-01-04 | Discharge: 2016-01-08 | Disposition: A | Discharge status: Home / Self Care | Payer: Medicare Other | Attending: Emergency Medicine | Admitting: Emergency Medicine

## 2016-01-04 ENCOUNTER — Encounter (HOSPITAL_COMMUNITY): Payer: Self-pay | Admitting: *Deleted

## 2016-01-04 DIAGNOSIS — Z8709 Personal history of other diseases of the respiratory system: Secondary | ICD-10-CM | POA: Insufficient documentation

## 2016-01-04 DIAGNOSIS — Z789 Other specified health status: Secondary | ICD-10-CM

## 2016-01-04 DIAGNOSIS — Z9889 Other specified postprocedural states: Secondary | ICD-10-CM | POA: Insufficient documentation

## 2016-01-04 DIAGNOSIS — Z915 Personal history of self-harm: Secondary | ICD-10-CM | POA: Diagnosis not present

## 2016-01-04 DIAGNOSIS — Z8739 Personal history of other diseases of the musculoskeletal system and connective tissue: Secondary | ICD-10-CM | POA: Insufficient documentation

## 2016-01-04 DIAGNOSIS — Z046 Encounter for general psychiatric examination, requested by authority: Secondary | ICD-10-CM | POA: Diagnosis present

## 2016-01-04 DIAGNOSIS — F911 Conduct disorder, childhood-onset type: Secondary | ICD-10-CM | POA: Diagnosis not present

## 2016-01-04 DIAGNOSIS — I252 Old myocardial infarction: Secondary | ICD-10-CM | POA: Insufficient documentation

## 2016-01-04 DIAGNOSIS — Z85038 Personal history of other malignant neoplasm of large intestine: Secondary | ICD-10-CM | POA: Insufficient documentation

## 2016-01-04 DIAGNOSIS — I2510 Atherosclerotic heart disease of native coronary artery without angina pectoris: Secondary | ICD-10-CM | POA: Diagnosis not present

## 2016-01-04 DIAGNOSIS — Z8711 Personal history of peptic ulcer disease: Secondary | ICD-10-CM | POA: Diagnosis not present

## 2016-01-04 DIAGNOSIS — Z862 Personal history of diseases of the blood and blood-forming organs and certain disorders involving the immune mechanism: Secondary | ICD-10-CM | POA: Insufficient documentation

## 2016-01-04 DIAGNOSIS — Z87891 Personal history of nicotine dependence: Secondary | ICD-10-CM | POA: Insufficient documentation

## 2016-01-04 DIAGNOSIS — I10 Essential (primary) hypertension: Secondary | ICD-10-CM | POA: Diagnosis not present

## 2016-01-04 DIAGNOSIS — Z7982 Long term (current) use of aspirin: Secondary | ICD-10-CM | POA: Diagnosis not present

## 2016-01-04 DIAGNOSIS — Z79899 Other long term (current) drug therapy: Secondary | ICD-10-CM | POA: Insufficient documentation

## 2016-01-04 DIAGNOSIS — Z8546 Personal history of malignant neoplasm of prostate: Secondary | ICD-10-CM | POA: Insufficient documentation

## 2016-01-04 DIAGNOSIS — F0391 Unspecified dementia with behavioral disturbance: Secondary | ICD-10-CM | POA: Insufficient documentation

## 2016-01-04 DIAGNOSIS — F03918 Unspecified dementia, unspecified severity, with other behavioral disturbance: Secondary | ICD-10-CM | POA: Diagnosis present

## 2016-01-04 DIAGNOSIS — Z8719 Personal history of other diseases of the digestive system: Secondary | ICD-10-CM | POA: Insufficient documentation

## 2016-01-04 DIAGNOSIS — Z8639 Personal history of other endocrine, nutritional and metabolic disease: Secondary | ICD-10-CM | POA: Insufficient documentation

## 2016-01-04 DIAGNOSIS — F6089 Other specific personality disorders: Secondary | ICD-10-CM | POA: Diagnosis not present

## 2016-01-04 DIAGNOSIS — R4689 Other symptoms and signs involving appearance and behavior: Secondary | ICD-10-CM

## 2016-01-04 LAB — COMPREHENSIVE METABOLIC PANEL
ALT: 16 U/L — ABNORMAL LOW (ref 17–63)
AST: 22 U/L (ref 15–41)
Albumin: 4.4 g/dL (ref 3.5–5.0)
Alkaline Phosphatase: 82 U/L (ref 38–126)
Anion gap: 9 (ref 5–15)
BUN: 16 mg/dL (ref 6–20)
CO2: 27 mmol/L (ref 22–32)
Calcium: 9 mg/dL (ref 8.9–10.3)
Chloride: 102 mmol/L (ref 101–111)
Creatinine, Ser: 1.15 mg/dL (ref 0.61–1.24)
GFR calc Af Amer: >60 mL/min (ref >60)
GFR calc non Af Amer: >60 mL/min (ref >60)
Glucose, Bld: 108 mg/dL — ABNORMAL HIGH (ref 65–99)
Potassium: 3.9 mmol/L (ref 3.5–5.1)
Sodium: 138 mmol/L (ref 135–145)
Total Bilirubin: 0.7 mg/dL (ref 0.3–1.2)
Total Protein: 7.8 g/dL (ref 6.5–8.1)

## 2016-01-04 LAB — RAPID URINE DRUG SCREEN, HOSP PERFORMED
Amphetamines: NOT DETECTED
Barbiturates: NOT DETECTED
Benzodiazepines: NOT DETECTED
Cocaine: NOT DETECTED
Opiates: NOT DETECTED
Tetrahydrocannabinol: NOT DETECTED

## 2016-01-04 LAB — CBC
HCT: 40.9 % (ref 39.0–52.0)
Hemoglobin: 13.5 g/dL (ref 13.0–17.0)
MCH: 27.8 pg (ref 26.0–34.0)
MCHC: 33 g/dL (ref 30.0–36.0)
MCV: 84.3 fL (ref 78.0–100.0)
Platelets: 224 10*3/uL (ref 150–400)
RBC: 4.85 MIL/uL (ref 4.22–5.81)
RDW: 15 % (ref 11.5–15.5)
WBC: 6.8 10*3/uL (ref 4.0–10.5)

## 2016-01-04 LAB — SALICYLATE LEVEL: Salicylate Lvl: <4 mg/dL (ref 2.8–30.0)

## 2016-01-04 LAB — ETHANOL: Alcohol, Ethyl (B): <5 mg/dL (ref <5)

## 2016-01-04 LAB — ACETAMINOPHEN LEVEL: Acetaminophen (Tylenol), Serum: <10 ug/mL — ABNORMAL LOW (ref 10–30)

## 2016-01-04 MED ORDER — ASPIRIN 81 MG PO CHEW
81.0000 mg | CHEWABLE_TABLET | Freq: Every day | ORAL | Status: DC
  Administered 2016-01-05 – 2016-01-08 (×4): 81 mg via ORAL
  Filled 2016-01-04 (×4): qty 1

## 2016-01-04 MED ORDER — CHLORPROMAZINE HCL 25 MG PO TABS
25.0000 mg | ORAL_TABLET | Freq: Every day | ORAL | Status: DC
  Administered 2016-01-04 – 2016-01-07 (×4): 25 mg via ORAL
  Filled 2016-01-04 (×4): qty 1

## 2016-01-04 MED ORDER — ALUM & MAG HYDROXIDE-SIMETH 200-200-20 MG/5ML PO SUSP: 30.0000 mL | ORAL | Status: DC | PRN

## 2016-01-04 MED ORDER — SERTRALINE HCL 50 MG PO TABS
25.0000 mg | ORAL_TABLET | Freq: Two times a day (BID) | ORAL | Status: DC
  Administered 2016-01-04 – 2016-01-08 (×8): 25 mg via ORAL
  Filled 2016-01-04 (×8): qty 1

## 2016-01-04 MED ORDER — IBUPROFEN 200 MG PO TABS: 600.0000 mg | ORAL_TABLET | Freq: Three times a day (TID) | ORAL | Status: DC | PRN

## 2016-01-04 MED ORDER — LORAZEPAM 1 MG PO TABS: 1.0000 mg | ORAL_TABLET | Freq: Three times a day (TID) | ORAL | Status: DC | PRN

## 2016-01-04 MED ORDER — RISPERIDONE 0.5 MG PO TABS
0.5000 mg | ORAL_TABLET | Freq: Every day | ORAL | Status: DC
  Administered 2016-01-04 – 2016-01-07 (×4): 0.5 mg via ORAL
  Filled 2016-01-04 (×4): qty 1

## 2016-01-04 MED ORDER — ONDANSETRON HCL 4 MG PO TABS: 4.0000 mg | ORAL_TABLET | Freq: Three times a day (TID) | ORAL | Status: DC | PRN

## 2016-01-04 MED ORDER — ACETAMINOPHEN 325 MG PO TABS: 650.0000 mg | ORAL_TABLET | ORAL | Status: DC | PRN

## 2016-01-04 MED ORDER — LAMOTRIGINE 100 MG PO TABS
100.0000 mg | ORAL_TABLET | Freq: Two times a day (BID) | ORAL | Status: DC
  Administered 2016-01-04 – 2016-01-08 (×8): 100 mg via ORAL
  Filled 2016-01-04 (×10): qty 1

## 2016-01-04 MED ORDER — MIRTAZAPINE 30 MG PO TABS
30.0000 mg | ORAL_TABLET | Freq: Every day | ORAL | Status: DC
  Administered 2016-01-04 – 2016-01-07 (×4): 30 mg via ORAL
  Filled 2016-01-04 (×4): qty 1

## 2016-01-04 NOTE — BH Assessment (Signed)
Assessment completed. Consulted Arlester Marker, NP who recommended inpatient treatment. TTS to seek placement. Informed Mercedes Camprubi-Soms, PA-C of the recommendation.

## 2016-01-04 NOTE — ED Notes (Addendum)
Pt BIB [employer]. Pt was IVC'd by wife. IVC papers state pt is danger to self and others. Police state the wife is concerned that the patient made threatening comments to her. Pt is currently seeing a psychologist who is treating him for Alzheimer's. Pt denies SI/HI, A/V hallucinations. Pt states he does not know why he is here, but would like help with managing his Alzheimer's, which he states he is in the early stages.   Police state pt has been cooperative and calm throughout transport. Pt calm, cooperative and friendly in triage.

## 2016-01-04 NOTE — ED Provider Notes (Signed)
CSN: [id number]     Arrival date & time 01/04/16  1846 History   First MD Initiated Contact with Patient 01/04/16 2021     Chief Complaint  Patient presents with  . IVC      (Consider location/radiation/quality/duration/timing/severity/associated sxs/prior Treatment) HPI Comments: Anthony Skinner is a 75 y.o. male with a PMHx of anemia, PUD, prostate cancer s/p prostatectomy, CAD, MI, hiatal hernia, HLD, vertebral artery stenosis s/p stenting, DJD, HTN, suicide attempt 08/2013 by hanging, dementia/alzheimer's, and fatty liver disease, who presents to the ED via [employer] who brought the patient in due to having IVC paperwork. The IVC paperwork was taken out by his wife and states "patient is a danger to self and others. Respondent and has been previously committed in [facility]. Respondent has had a suicide attempt within 2 years, respondent is diagnosed with Alzheimer's. Respondent has been threatening towards his wife saying "if you call the police, you will pay for it". Petitioner reports the respondent is abusing drugs but was not specific as to what kind. Respondent has been under the care of a psychiatrist for 18 months."  Patient denies having any SI/HI/AVH, admits that 2 years ago he did have hallucinations at the time that he attempted suicide by hanging himself, he has not had these since then and he is compliant with all his medications. He last took his morning medications at 8 AM. He is compliant with seeing his psychiatrist every month, Dr. Clarnce Flock. He denies any alcohol use or illicit drug use, he is a nonsmoker. He has no medical complaints today. He states that he thinks the reason she took IVC paperwork out on him was because had an argument that he denies that he made any threats against her.  Patient is a 75 y.o. male presenting with mental health disorder. The history is provided by the patient and medical records. No language interpreter was used.  Mental Health  Problem Presenting symptoms: no hallucinations, no homicidal ideas and no suicidal thoughts   Presenting symptoms comment:  IVC paperwork states he made a threat to his wife stating "if you call the police, you will pay for it" Patient accompanied by:  Law enforcement Onset quality:  Unable to specify Timing:  Unable to specify Progression:  Unable to specify Treatment compliance:  All of the time Time since last psychoactive medication taken:  12 hours Relieved by:  None tried Worsened by:  Nothing tried Ineffective treatments:  None tried Associated symptoms: no abdominal pain and no chest pain   Risk factors: hx of mental illness, hx of suicide attempts and recent psychiatric admission     Past Medical History  Diagnosis Date  . Iron deficiency anemia   . Tubular adenoma of colon 2012  . Gastropathy 2012    reactive  . PUD (peptic ulcer disease)   . Prostate cancer ([place])     a. 09/2008 s/p prostatectomy.  . CAD (coronary artery disease)     a. reported h/o MI in the 35's;  b. 04/2000 Cath: LM nl, LAD 40p, D1 small, nl, RI nl, LCX nl, RCA nl.  . Hyperlipidemia   . Vertebral artery stenosis     a. 09/2010 s/p L vertebral stenting 09/2010.  Marland Kitchen GERD (gastroesophageal reflux disease)   . Hiatal hernia   . Recurrent spontaneous pneumothorax     a. s/p L lobectomy in 1966.  Marland Kitchen DJD (degenerative joint disease)   . Hypertension   . Upper GI bleed  a. 2012  . Fatty liver   . Hypertension   . Syncope     a. in setting of GIB in 2012, presumed to be orthostatic.  . Suicide attempt ([place])     a. 08/2013 attempt by hanging with subsequent resp failure  . Myocardial infarction ([place])     " BACK IN THE 90'S"  . Dementia   . Shortness of breath    Past Surgical History  Procedure Laterality Date  . Prostatectomy    . Vertebral artery stent    . Lung removal, partial  1960s    left  . Cataract extraction Right   . Cardiac surgery    . Cardiac catheterization  09/15/2013  .  Left heart catheterization with coronary angiogram N/A 09/15/2013    Procedure: LEFT HEART CATHETERIZATION WITH CORONARY ANGIOGRAM;  Surgeon: Burnell Blanks, MD;  Location: [facility] CATH LAB;  Service: Cardiovascular;  Laterality: N/A;   Family History  Problem Relation Age of Onset  . Heart attack Father   . Alzheimer's disease Mother   . Prostate cancer Brother    Social History  Substance Use Topics  . Smoking status: Former Smoker -- 1.00 packs/day for 40 years    Types: Cigarettes    Quit date: 08/27/2002  . Smokeless tobacco: Never Used  . Alcohol Use: No     Comment: Hx heavy EtOH use but quit 2011    Review of Systems  Constitutional: Negative for fever and chills.  Respiratory: Negative for shortness of breath.   Cardiovascular: Negative for chest pain.  Gastrointestinal: Negative for nausea, vomiting, abdominal pain, diarrhea and constipation.  Genitourinary: Negative for dysuria and hematuria.  Musculoskeletal: Negative for myalgias and arthralgias.  Skin: Negative for color change.  Allergic/Immunologic: Negative for immunocompromised state.  Neurological: Negative for weakness and numbness.  Psychiatric/Behavioral: Negative for suicidal ideas, homicidal ideas, hallucinations and confusion.   10 Systems reviewed and are negative for acute change except as noted in the HPI.    Allergies  Review of patient's allergies indicates no known allergies.  Home Medications   Prior to Admission medications   Medication Sig Start Date End Date Taking? Authorizing Provider  aspirin 81 MG tablet Take 81 mg by mouth daily.   Yes Historical Provider, MD  lamoTRIgine (LAMICTAL) 100 MG tablet Take 100 mg by mouth 2 (two) times daily. 10/04/15  Yes Historical Provider, MD  mirtazapine (REMERON) 30 MG tablet Take 30 mg by mouth daily.  05/30/15  Yes Historical Provider, MD  risperiDONE (RISPERDAL) 0.5 MG tablet Take 1 tablet (0.5 mg total) by mouth at bedtime. 06/13/15  Yes  Delfin Gant, NP  sertraline (ZOLOFT) 50 MG tablet Take 1 tablet (50 mg total) by mouth daily. 09/02/15  Yes Delfin Gant, NP   BP 138/83 mmHg  Pulse 71  Temp(Src) 98.1 F (36.7 C) (Oral)  Resp 18  SpO2 99% Physical Exam  Constitutional: He is oriented to person, place, and time. Vital signs are normal. He appears well-developed and well-nourished.  Non-toxic appearance. No distress.  Afebrile, nontoxic, NAD  HENT:  Head: Normocephalic and atraumatic.  Mouth/Throat: Oropharynx is clear and moist and mucous membranes are normal.  Eyes: Conjunctivae and EOM are normal. Right eye exhibits no discharge. Left eye exhibits no discharge.  Neck: Normal range of motion. Neck supple.  Cardiovascular: Normal rate, regular rhythm, normal heart sounds and intact distal pulses.  Exam reveals no gallop and no friction rub.   No murmur heard. Pulmonary/Chest: Effort  normal and breath sounds normal. No respiratory distress. He has no decreased breath sounds. He has no wheezes. He has no rhonchi. He has no rales.  Abdominal: Soft. Normal appearance and bowel sounds are normal. He exhibits no distension. There is no tenderness. There is no rigidity, no rebound, no guarding and no CVA tenderness.  Musculoskeletal: Normal range of motion.  Neurological: He is alert and oriented to person, place, and time. He has normal strength. No sensory deficit.  A&O x4, pleasant and cooperative, no focal neuro deficits  Skin: Skin is warm, dry and intact. No rash noted.  Psychiatric: He has a normal mood and affect. He is not aggressive and not actively hallucinating. He does not exhibit a depressed mood. He expresses no homicidal and no suicidal ideation. He expresses no suicidal plans and no homicidal plans.  Pleasant and cooperative, denies SI/HI/AVH, no aggressive behavior, does not appear depressed  Nursing note and vitals reviewed.   ED Course  Procedures (including critical care time) Labs  Review Labs Reviewed  COMPREHENSIVE METABOLIC PANEL - Abnormal; Notable for the following:    Glucose, Bld 108 (*)    ALT 16 (*)    All other components within normal limits  ACETAMINOPHEN LEVEL - Abnormal; Notable for the following:    Acetaminophen (Tylenol), Serum <10 (*)    All other components within normal limits  ETHANOL  SALICYLATE LEVEL  CBC  URINE RAPID DRUG SCREEN, HOSP PERFORMED  LAMOTRIGINE LEVEL    Imaging Review No results found. I have personally reviewed and evaluated these images and lab results as part of my medical decision-making.   EKG Interpretation None      MDM   Final diagnoses:  Dementia with behavioral disturbance  Aggressive behavior    75 y.o. male here with IVC paperwork that states that his wife feels that he is a threat to herself because he stated that "if she called the police she would pay for it". His wife also says that he was abusing drugs was nonspecific. Patient denies having any SI/HI/AVH, denies any illicit drug use or alcohol use, states he is compliant with all his medications and has been under the care of a psychiatrist for the last 18 months. He is very pleasant and cooperative during exam, has no complaints, and denies any thoughts of harming anyone. A&O x4 and doesn't appear to be confused or altered in any way. Will get TTS to consult to see if we can rescind IVC paperwork sicne it doesn't appear that he is a threat to himself or anyone else at this time. Will reassess shortly, UDS neg but other clearance labs pending. Will add-on lamictal level to have if we end up needing to keep him, to ensure no overdosing on this.   10:11 PM CMP, EtOH, Salicylate, Acetaminophen, and CBC all unremarkable. UDS neg. Lamotrigine level pending still. [place] of TTS states pt meets inpatient criteria, therefore will leave IVC paperwork how it is. Please see their notes for further documentation of care/dispo. Pt medically cleared. Holding orders  placed, home meds ordered.  BP 138/83 mmHg  Pulse 71  Temp(Src) 98.1 F (36.7 C) (Oral)  Resp 18  SpO2 99%  Meds ordered this encounter  Medications  . sertraline (ZOLOFT) 25 MG tablet    Sig: Take 25 mg by mouth 2 (two) times daily.  . chlorproMAZINE (THORAZINE) 25 MG tablet    Sig: Take 25 mg by mouth at bedtime.     Refill:  1  .  aspirin tablet 81 mg    Sig:   . chlorproMAZINE (THORAZINE) tablet 25 mg    Sig:   . lamoTRIgine (LAMICTAL) tablet 100 mg    Sig:   . mirtazapine (REMERON) tablet 30 mg    Sig:   . risperiDONE (RISPERDAL) tablet 0.5 mg    Sig:   . sertraline (ZOLOFT) tablet 25 mg    Sig:   . alum & mag hydroxide-simeth (MAALOX/MYLANTA) 200-200-20 MG/5ML suspension 30 mL    Sig:   . ondansetron (ZOFRAN) tablet 4 mg    Sig:   . ibuprofen (ADVIL,MOTRIN) tablet 600 mg    Sig:   . acetaminophen (TYLENOL) tablet 650 mg    Sig:   . LORazepam (ATIVAN) tablet 1 mg    Sig:      Ladashia Demarinis Camprubi-Soms, PA-C 01/04/16 2213  Noemi Chapel, MD 01/05/16 2358
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
## 2016-01-05 ENCOUNTER — Emergency Department (HOSPITAL_COMMUNITY): Payer: Medicare Other

## 2016-01-05 DIAGNOSIS — F0391 Unspecified dementia with behavioral disturbance: Secondary | ICD-10-CM

## 2016-01-05 LAB — LAMOTRIGINE LEVEL: Lamotrigine Lvl: 2.1 ug/mL (ref 2.0–20.0)

## 2016-01-05 NOTE — ED Notes (Signed)
Psychiatry at bedside.

## 2016-01-05 NOTE — Progress Notes (Addendum)
CSW staffed with psychiatry team. CSW was informed patient's wife wanted to speak with social work. CSW spoke with Anthony Skinner, patient's wife [phone]. She reports that she loves her husband but that he is mentally ill. She reports they have been together for twenty years. She reports patient attempted suicide twenty years ago and he has been in and out of hospitals. She reports when patient has been at these hospitals, he convinces staff that he is "ok and he is sent home. She reports patient is seen by a psychiatrist.   She reports the biggest obstacle is that patient does not have Medicaid and his income is only nine hundred monthly. She reports her income is only five hundred monthly. She reports patient does not want to "give up his check" because he feels she will not be able to "make it". She reports she will be able to make as she has children and she just wants patient to receive appropriate help. She reports she would like for patient to be placed in a Nursing Home or somewhere he would receive appropriate help.   CSW asked the wife if she had spoken with anyone at [facility] regarding Medicaid. She reports she has talked with someone at [facility]. CSW offered encouragement to follow back up with DSS to discuss Medicaid. No other questions to note at this time for CSW.  Anthony Skinner [id number] ED CSW 01/05/2016 12:47 PM

## 2016-01-05 NOTE — ED Notes (Signed)
Pt back from x-ray.

## 2016-01-05 NOTE — BH Assessment (Signed)
[facility] Assessment Progress Note  The following facilities have been contacted to seek placement for this pt, with results as noted:  Beds available, information sent, decision pending:  Haematologist   At capacity:  [facility], [place] Triage Specialist [phone]

## 2016-01-05 NOTE — ED Notes (Signed)
Pt alert and oriented x4. Respirations even and unlabored, bilateral symmetrical rise and fall of chest. Skin warm and dry. In no acute distress. Denies needs.   
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
## 2016-01-06 NOTE — Progress Notes (Signed)
Reassessment  Pt is sitting on the side of the bed communicating with his wife pleasantly.  Patient is able to answer question appropriately and wife is asking for information from the CSW if possible.  This Probation officer informed ED CSW to follow up with family about long-term options.     Chesley Noon, MSW, [facility] Triage Specialist [phone]

## 2016-01-06 NOTE — Progress Notes (Signed)
Disposition CSW received telephone contact from [facility] declining patient referral stating patient inappropriate for their program.  [place] Disposition CSW [phone]
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
## 2016-01-07 NOTE — Progress Notes (Signed)
Patient reports not currently complaints. Patient repots compliance with medications.  Patient reports having some anxiety yesterday but after medications felt better.  He want to go home.    Chesley Noon, MSW, Darlyn Read [facility] Triage Specialist [phone]

## 2016-01-07 NOTE — Progress Notes (Signed)
Disposition CSW completed additional patient referrals to the following [facility] facilities:  [facility] will continue to follow patient for placement needs.  [facility] Disposition CSW [phone]

## 2016-01-07 NOTE — ED Notes (Signed)
Patient sitting up in recliner, respirations even and unlabored, skin warm and dry, denies needs at this time. Will continue to monitor.

## 2016-01-08 DIAGNOSIS — F6089 Other specific personality disorders: Secondary | ICD-10-CM | POA: Diagnosis not present

## 2016-01-08 NOTE — BH Assessment (Signed)
[facility] Assessment Progress Note  Per Corena Pilgrim, MD, this pt does not require psychiatric hospitalization at this time.  He presents under IVC initiated by his wife, Damarion Steveson.  He is to be released from [facility] and discharged from [facility] with recommendation to follow up with Dr Judd Gaudier, his outpatient provider.  IVC has been rescinded.  Recommendation to follow up with Dr Judd Gaudier has been included in pt's discharge instructions, however, this writer was unable to find contact information for the provider on-line or in pt's EPIC record.  For this reason contact information was not included.  Pt's nurse has been notified.  Jalene Mullet, [place] Triage Specialist [phone]

## 2016-01-08 NOTE — ED Notes (Signed)
TTS at bedside. 

## 2016-01-08 NOTE — BHH Suicide Risk Assessment (Signed)
Suicide Risk Assessment  Discharge Assessment   [facility] Discharge Suicide Risk Assessment   Principal Problem: Dementia with behavioral disturbance Discharge Diagnoses:  Patient Active Problem List   Diagnosis Date Noted  . Dementia with behavioral disturbance [F03.91] 09/01/2015    Priority: High  . Major depressive disorder without psychotic features ([place]) [F32.9] 05/24/2015    Priority: High  . Aggressive behavior [F60.89]     Priority: High  . Suicidal ideation [R45.851]   . Dementia [F03.90]   . Chest pain [R07.9] 09/15/2013  . Coronary atherosclerosis of native coronary artery [I25.10] 09/15/2013  . Hypokalemia [E87.6] 08/28/2013  . Acute respiratory failure ([place]) [J96.00] 08/25/2013  . Altered mental status [R41.82] 08/25/2013  . Anoxic brain injury ([place]) [G93.1] 08/25/2013  . HTN (hypertension) [I10] 08/25/2013  . Suicide attempt ([place]) [T14.91] 08/25/2013  . Vitamin B 12 deficiency [E53.8] 07/15/2013  . Unspecified hereditary and idiopathic peripheral neuropathy [G60.9] 07/15/2013  . Memory loss [R41.3] 07/15/2013    Total Time spent with patient: 30 minutes  Musculoskeletal: Strength & Muscle Tone: within normal limits Gait & Station: normal Patient leans: N/A  Psychiatric Specialty Exam: Review of Systems  Constitutional: Negative.   HENT: Negative.   Eyes: Negative.   Respiratory: Negative.   Cardiovascular: Negative.   Gastrointestinal: Negative.   Genitourinary: Negative.   Musculoskeletal: Negative.   Skin: Negative.   Neurological: Negative.   Endo/Heme/Allergies: Negative.   Psychiatric/Behavioral: Positive for memory loss.    Blood pressure 120/54, pulse 75, temperature 98.4 F (36.9 C), temperature source Oral, resp. rate 16, SpO2 96 %.There is no weight on file to calculate BMI.  General Appearance: Casual  Eye Contact::  Good  Speech:  Normal Rate  Volume:  Normal  Mood:  Anxious, mild  Affect:  Congruent  Thought Process:  Coherent   Orientation:  Full (Time, Place, and Person)  Thought Content:  Rumination  Suicidal Thoughts:  No  Homicidal Thoughts:  No  Memory:  Immediate;   Fair Recent;   Fair Remote;   Fair  Judgement:  Fair  Insight:  Fair  Psychomotor Activity:  Normal  Concentration:  Fair  Recall:  [employer] of Knowledge:Good  Language: Good  Akathisia:  No  Handed:  Right  AIMS (if indicated):     Assets:  Leisure Time Physical Health Resilience Social Support  ADL's:  Intact  Cognition: Impaired,  Mild  Sleep:      Mental Status Per Nursing Assessment::   On Admission:   Agitation  Demographic Factors:  Age 16 or older, male, caucasian  Loss Factors: NA  Historical Factors: NA  Risk Reduction Factors:   Sense of responsibility to family, Religious beliefs about death, Living with another person, especially a relative and Positive social support  Continued Clinical Symptoms:  Anxiety, mild  Cognitive Features That Contribute To Risk:  None    Suicide Risk:  Minimal: No identifiable suicidal ideation.  Patients presenting with no risk factors but with morbid ruminations; may be classified as minimal risk based on the severity of the depressive symptoms    Plan Of Care/Follow-up recommendations:  Activity:  as tolerated Diet:  heart healthy diet  Langley Flatley, NP 01/08/2016, 2:49 PM

## 2016-01-08 NOTE — ED Notes (Signed)
Upon shift assessment pt denies SI/HI or A/VH; pt states was sent here "because I have stage one Alzheimer and my wife thought I was going to hurt myself."

## 2016-01-08 NOTE — Discharge Instructions (Signed)
For your ongoing mental health needs, you are advised to continue treatment with Dr. Zeb Comfort, your current outpatient psychiatrist.  You are advised to keep your next scheduled appointment.

## 2016-01-08 NOTE — BH Assessment (Signed)
Writer called and spoke w/ pt's wife Kayle Shiver [phone]. She agrees with disposition plan for pt to be d/c home. Mrs Stanberry reports she will be here at 2 pm to pick up pt.   Arnold Long, [place] [facility] Triage Specialist
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
## 2016-02-20 ENCOUNTER — Emergency Department (HOSPITAL_COMMUNITY)
Admission: EM | Admit: 2016-02-20 | Discharge: 2016-02-21 | Payer: Medicare Other | Attending: Emergency Medicine | Admitting: Emergency Medicine

## 2016-02-20 ENCOUNTER — Encounter (HOSPITAL_COMMUNITY): Payer: Self-pay | Admitting: Emergency Medicine

## 2016-02-20 DIAGNOSIS — Z862 Personal history of diseases of the blood and blood-forming organs and certain disorders involving the immune mechanism: Secondary | ICD-10-CM | POA: Insufficient documentation

## 2016-02-20 DIAGNOSIS — Z9889 Other specified postprocedural states: Secondary | ICD-10-CM | POA: Diagnosis not present

## 2016-02-20 DIAGNOSIS — I252 Old myocardial infarction: Secondary | ICD-10-CM | POA: Diagnosis not present

## 2016-02-20 DIAGNOSIS — F329 Major depressive disorder, single episode, unspecified: Secondary | ICD-10-CM | POA: Insufficient documentation

## 2016-02-20 DIAGNOSIS — Z87891 Personal history of nicotine dependence: Secondary | ICD-10-CM | POA: Insufficient documentation

## 2016-02-20 DIAGNOSIS — Z86018 Personal history of other benign neoplasm: Secondary | ICD-10-CM | POA: Diagnosis not present

## 2016-02-20 DIAGNOSIS — Z8546 Personal history of malignant neoplasm of prostate: Secondary | ICD-10-CM | POA: Insufficient documentation

## 2016-02-20 DIAGNOSIS — M1990 Unspecified osteoarthritis, unspecified site: Secondary | ICD-10-CM | POA: Insufficient documentation

## 2016-02-20 DIAGNOSIS — I10 Essential (primary) hypertension: Secondary | ICD-10-CM | POA: Insufficient documentation

## 2016-02-20 DIAGNOSIS — F919 Conduct disorder, unspecified: Secondary | ICD-10-CM | POA: Insufficient documentation

## 2016-02-20 DIAGNOSIS — R45851 Suicidal ideations: Secondary | ICD-10-CM | POA: Diagnosis not present

## 2016-02-20 DIAGNOSIS — Z8709 Personal history of other diseases of the respiratory system: Secondary | ICD-10-CM | POA: Diagnosis not present

## 2016-02-20 DIAGNOSIS — Z8611 Personal history of tuberculosis: Secondary | ICD-10-CM | POA: Diagnosis not present

## 2016-02-20 DIAGNOSIS — F0391 Unspecified dementia with behavioral disturbance: Secondary | ICD-10-CM | POA: Diagnosis not present

## 2016-02-20 DIAGNOSIS — Z8639 Personal history of other endocrine, nutritional and metabolic disease: Secondary | ICD-10-CM | POA: Insufficient documentation

## 2016-02-20 DIAGNOSIS — Z8719 Personal history of other diseases of the digestive system: Secondary | ICD-10-CM | POA: Diagnosis not present

## 2016-02-20 DIAGNOSIS — I2510 Atherosclerotic heart disease of native coronary artery without angina pectoris: Secondary | ICD-10-CM | POA: Diagnosis not present

## 2016-02-20 DIAGNOSIS — F03918 Unspecified dementia, unspecified severity, with other behavioral disturbance: Secondary | ICD-10-CM | POA: Diagnosis present

## 2016-02-20 DIAGNOSIS — R4182 Altered mental status, unspecified: Secondary | ICD-10-CM | POA: Diagnosis present

## 2016-02-20 LAB — COMPREHENSIVE METABOLIC PANEL
ALT: 14 U/L — ABNORMAL LOW (ref 17–63)
AST: 19 U/L (ref 15–41)
Albumin: 4.2 g/dL (ref 3.5–5.0)
Alkaline Phosphatase: 63 U/L (ref 38–126)
Anion gap: 5 (ref 5–15)
BUN: 18 mg/dL (ref 6–20)
CO2: 27 mmol/L (ref 22–32)
Calcium: 9.3 mg/dL (ref 8.9–10.3)
Chloride: 107 mmol/L (ref 101–111)
Creatinine, Ser: 1.22 mg/dL (ref 0.61–1.24)
GFR calc Af Amer: >60 mL/min (ref >60)
GFR calc non Af Amer: 57 mL/min — ABNORMAL LOW (ref >60)
Glucose, Bld: 96 mg/dL (ref 65–99)
Potassium: 4.2 mmol/L (ref 3.5–5.1)
Sodium: 139 mmol/L (ref 135–145)
Total Bilirubin: 0.9 mg/dL (ref 0.3–1.2)
Total Protein: 7.6 g/dL (ref 6.5–8.1)

## 2016-02-20 LAB — CBC
HCT: 43.2 % (ref 39.0–52.0)
Hemoglobin: 13.8 g/dL (ref 13.0–17.0)
MCH: 27.7 pg (ref 26.0–34.0)
MCHC: 31.9 g/dL (ref 30.0–36.0)
MCV: 86.6 fL (ref 78.0–100.0)
Platelets: 238 10*3/uL (ref 150–400)
RBC: 4.99 MIL/uL (ref 4.22–5.81)
RDW: 15.1 % (ref 11.5–15.5)
WBC: 3.5 10*3/uL — ABNORMAL LOW (ref 4.0–10.5)

## 2016-02-20 LAB — RAPID URINE DRUG SCREEN, HOSP PERFORMED
Amphetamines: NOT DETECTED
Barbiturates: NOT DETECTED
Benzodiazepines: NOT DETECTED
Cocaine: NOT DETECTED
Opiates: NOT DETECTED
Tetrahydrocannabinol: NOT DETECTED

## 2016-02-20 LAB — SALICYLATE LEVEL: Salicylate Lvl: <4 mg/dL (ref 2.8–30.0)

## 2016-02-20 LAB — ETHANOL: Alcohol, Ethyl (B): <5 mg/dL (ref <5)

## 2016-02-20 LAB — ACETAMINOPHEN LEVEL: Acetaminophen (Tylenol), Serum: <10 ug/mL — ABNORMAL LOW (ref 10–30)

## 2016-02-20 MED ORDER — RISPERIDONE 0.5 MG PO TABS
0.5000 mg | ORAL_TABLET | Freq: Every day | ORAL | Status: DC
  Administered 2016-02-20: 0.5 mg via ORAL
  Filled 2016-02-20: qty 1

## 2016-02-20 MED ORDER — CHLORPROMAZINE HCL 25 MG PO TABS
50.0000 mg | ORAL_TABLET | ORAL | Status: DC
  Administered 2016-02-20: 50 mg via ORAL
  Filled 2016-02-20: qty 2

## 2016-02-20 MED ORDER — SERTRALINE HCL 50 MG PO TABS: 25.0000 mg | ORAL_TABLET | Freq: Three times a day (TID) | ORAL | Status: DC

## 2016-02-20 MED ORDER — LAMOTRIGINE 100 MG PO TABS
100.0000 mg | ORAL_TABLET | Freq: Two times a day (BID) | ORAL | Status: DC
  Administered 2016-02-20 – 2016-02-21 (×2): 100 mg via ORAL
  Filled 2016-02-20 (×3): qty 1

## 2016-02-20 MED ORDER — ASPIRIN EC 81 MG PO TBEC
81.0000 mg | DELAYED_RELEASE_TABLET | Freq: Every day | ORAL | Status: DC
  Administered 2016-02-21: 81 mg via ORAL
  Filled 2016-02-20: qty 1

## 2016-02-20 MED ORDER — MIRTAZAPINE 30 MG PO TABS
30.0000 mg | ORAL_TABLET | Freq: Every day | ORAL | Status: DC
  Administered 2016-02-20: 30 mg via ORAL
  Filled 2016-02-20: qty 1
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
## 2016-02-20 NOTE — BHH Counselor (Signed)
02-19-26 referral sent to [place], [place], [place], [place], [place], [place]  Declined: Mayer Camel [[place]], [facility] [[place]] Lambert Mody K. Nash Shearer, LPC-A, [facility]  Counselor 02/20/2016 6:14 PM

## 2016-02-20 NOTE — BH Assessment (Signed)
TTS Marlou Porch) will assess the patient.
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
## 2016-02-20 NOTE — ED Notes (Signed)
Pt requesting his night time meds.  Informed would speak to provider.  Pt verbalized understanding.
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
## 2016-02-20 NOTE — ED Notes (Signed)
Pt & belongings wanded by Security.

## 2016-02-21 NOTE — ED Notes (Signed)
Pt resitng on stretcher with eyes closed; sitter at bedside; no distress noted

## 2016-02-21 NOTE — ED Notes (Signed)
Report given to [place]. Call has been placed

## 2016-02-21 NOTE — ED Notes (Signed)
Patient given morning tray and ate their breakfast.

## 2016-02-21 NOTE — BH Assessment (Signed)
Per Florentina Jenny, patient accepted to [facility]. The accepting provider is Dr. Brantley Fling. Nursing report # [phone]. Patient's bed will be available after 2pm. Nursing staff updated about patient's disposition and will need to make transportation arrangements w/ [facility]. Psychiatry made aware of patient's disposition and agree with transfer to [facility].

## 2016-02-21 NOTE — ED Notes (Signed)
Report given to [facility] at [facility].

## 2016-02-21 NOTE — ED Notes (Signed)
Allen from [facility] called and said bed would not be ready until 2pm.  Please call and give report at [phone].

## 2016-03-12 ENCOUNTER — Emergency Department (HOSPITAL_COMMUNITY): Payer: Medicare Other

## 2016-03-12 ENCOUNTER — Emergency Department (HOSPITAL_COMMUNITY)
Admission: EM | Admit: 2016-03-12 | Discharge: 2016-03-13 | Disposition: A | Discharge status: Home / Self Care | Payer: Medicare Other | Attending: Emergency Medicine | Admitting: Emergency Medicine

## 2016-03-12 ENCOUNTER — Encounter (HOSPITAL_COMMUNITY): Payer: Self-pay | Admitting: Emergency Medicine

## 2016-03-12 DIAGNOSIS — Z8719 Personal history of other diseases of the digestive system: Secondary | ICD-10-CM | POA: Diagnosis not present

## 2016-03-12 DIAGNOSIS — Z79899 Other long term (current) drug therapy: Secondary | ICD-10-CM | POA: Diagnosis not present

## 2016-03-12 DIAGNOSIS — F919 Conduct disorder, unspecified: Secondary | ICD-10-CM | POA: Diagnosis present

## 2016-03-12 DIAGNOSIS — Z85038 Personal history of other malignant neoplasm of large intestine: Secondary | ICD-10-CM | POA: Insufficient documentation

## 2016-03-12 DIAGNOSIS — Z9889 Other specified postprocedural states: Secondary | ICD-10-CM | POA: Diagnosis not present

## 2016-03-12 DIAGNOSIS — I2510 Atherosclerotic heart disease of native coronary artery without angina pectoris: Secondary | ICD-10-CM | POA: Diagnosis not present

## 2016-03-12 DIAGNOSIS — F03918 Unspecified dementia, unspecified severity, with other behavioral disturbance: Secondary | ICD-10-CM

## 2016-03-12 DIAGNOSIS — Z8546 Personal history of malignant neoplasm of prostate: Secondary | ICD-10-CM | POA: Diagnosis not present

## 2016-03-12 DIAGNOSIS — I10 Essential (primary) hypertension: Secondary | ICD-10-CM | POA: Insufficient documentation

## 2016-03-12 DIAGNOSIS — Z8709 Personal history of other diseases of the respiratory system: Secondary | ICD-10-CM | POA: Diagnosis not present

## 2016-03-12 DIAGNOSIS — Z87891 Personal history of nicotine dependence: Secondary | ICD-10-CM | POA: Insufficient documentation

## 2016-03-12 DIAGNOSIS — F0391 Unspecified dementia with behavioral disturbance: Secondary | ICD-10-CM | POA: Diagnosis present

## 2016-03-12 DIAGNOSIS — Z915 Personal history of self-harm: Secondary | ICD-10-CM | POA: Insufficient documentation

## 2016-03-12 DIAGNOSIS — I252 Old myocardial infarction: Secondary | ICD-10-CM | POA: Insufficient documentation

## 2016-03-12 DIAGNOSIS — G309 Alzheimer's disease, unspecified: Secondary | ICD-10-CM | POA: Diagnosis not present

## 2016-03-12 DIAGNOSIS — M1990 Unspecified osteoarthritis, unspecified site: Secondary | ICD-10-CM | POA: Diagnosis not present

## 2016-03-12 DIAGNOSIS — F0281 Dementia in other diseases classified elsewhere with behavioral disturbance: Secondary | ICD-10-CM | POA: Diagnosis not present

## 2016-03-12 DIAGNOSIS — Z8711 Personal history of peptic ulcer disease: Secondary | ICD-10-CM | POA: Diagnosis not present

## 2016-03-12 DIAGNOSIS — Z7982 Long term (current) use of aspirin: Secondary | ICD-10-CM | POA: Diagnosis not present

## 2016-03-12 DIAGNOSIS — Z862 Personal history of diseases of the blood and blood-forming organs and certain disorders involving the immune mechanism: Secondary | ICD-10-CM | POA: Insufficient documentation

## 2016-03-12 LAB — COMPREHENSIVE METABOLIC PANEL
ALT: 13 U/L — ABNORMAL LOW (ref 17–63)
AST: 21 U/L (ref 15–41)
Albumin: 4.1 g/dL (ref 3.5–5.0)
Alkaline Phosphatase: 62 U/L (ref 38–126)
Anion gap: 9 (ref 5–15)
BUN: 17 mg/dL (ref 6–20)
CO2: 25 mmol/L (ref 22–32)
Calcium: 9 mg/dL (ref 8.9–10.3)
Chloride: 105 mmol/L (ref 101–111)
Creatinine, Ser: 1.19 mg/dL (ref 0.61–1.24)
GFR calc Af Amer: >60 mL/min (ref >60)
GFR calc non Af Amer: 59 mL/min — ABNORMAL LOW (ref >60)
Glucose, Bld: 89 mg/dL (ref 65–99)
Potassium: 3.9 mmol/L (ref 3.5–5.1)
Sodium: 139 mmol/L (ref 135–145)
Total Bilirubin: 0.5 mg/dL (ref 0.3–1.2)
Total Protein: 7.3 g/dL (ref 6.5–8.1)

## 2016-03-12 LAB — CBC WITH DIFFERENTIAL/PLATELET
Basophils Absolute: 0 10*3/uL (ref 0.0–0.1)
Basophils Relative: 1 %
Eosinophils Absolute: 0.1 10*3/uL (ref 0.0–0.7)
Eosinophils Relative: 2 %
HCT: 39.2 % (ref 39.0–52.0)
Hemoglobin: 12.9 g/dL — ABNORMAL LOW (ref 13.0–17.0)
Lymphocytes Relative: 13 %
Lymphs Abs: 0.8 10*3/uL (ref 0.7–4.0)
MCH: 27.7 pg (ref 26.0–34.0)
MCHC: 32.9 g/dL (ref 30.0–36.0)
MCV: 84.3 fL (ref 78.0–100.0)
Monocytes Absolute: 0.7 10*3/uL (ref 0.1–1.0)
Monocytes Relative: 11 %
Neutro Abs: 4.5 10*3/uL (ref 1.7–7.7)
Neutrophils Relative %: 73 %
Platelets: 229 10*3/uL (ref 150–400)
RBC: 4.65 MIL/uL (ref 4.22–5.81)
RDW: 15 % (ref 11.5–15.5)
WBC: 6.1 10*3/uL (ref 4.0–10.5)

## 2016-03-12 NOTE — ED Notes (Signed)
Pt brought in voluntarily by [employer] after having an argument with his wife tonight. Wife states that the patient has dementia and was doing things 'differently' tonight (ie. Wanted dinner earlier than normal) and then they got in an argument about what they were going to do next. Pt is calm, cooperative at this time. Alert and oriented.

## 2016-03-12 NOTE — BH Assessment (Signed)
Contacted patients wife Ronel Habash at [phone] who states that the patient "acted different all day." "He was awful quiet and cordial when I visited a friend of mind, this afternoon he got real quiet and said I'd like to go to bed early tonight. We ate dinner and he is usually not a quiet person and he is getting worse and he said you will not call anyone because I am not going anywhere this time." "I do not feel safe because he has not been himself all day he's never quiet." Patients wife states that the patient states "if you call the police on me you will be sorry."  Patients wife states that the patient attempted to commit suicide three years ago and she does not feel safe that he will not hurt her or anyone else.  Patients wife states "if he comes home tonight he will be violent."   Informed Patriciaann Clan, PA-C.    Rosalin Hawking, LCSW Therapeutic Triage Specialist [place] 03/12/2016 9:52 PM
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
## 2016-03-12 NOTE — BH Assessment (Addendum)
Assessment Note  Anthony Skinner is an 75 y.o. male presenting to [facility] voluntarily due to an argument with his wife. Patient states that his wife was afraid due to his early onset dementia and was "afraid I had lost it and thought I would do something so I just came in voluntarily." Patient denies SI and states that he had one previous suicide attempt about three years ago. Patient denies SI since then with no other attempts. Patient denies self-injurious behaviors. Patient denies HI and history of aggression towards others. Patient denies access to weapons/firearms. Patient denies history of trauma/abuse. Patient denies AVH and does not appear to be responding to internal stimuli. Patient is calm and cooperative during assessment and answers questions independently. Patient denies use of drugs and alcohol. Patient UDS clear and BAL <5 at time of assessment.   Patient is calm and cooperative upon assessment.   Patient is alert and oriented x4. Patient is able to recall recent events of his previous hospitalization (02/21/2016). Patient makes good eye contact and is dressed in clothing from home. Patients clothing is unremarkable. Patient states that he sees Dr. Zeb Comfort for early onset Dementia and he has been seeing him for approximately one year. Patient states that he has an appointment about once every three weeks. Patient reports that he was sent to [facility] earlier this months and reports that he has had hospitalizations in 2014 and 2016. Patient denies feeling that he needs hospitalization at this time stating that he has "prayed and God got those feelings out of me, if I kill myself I'll go to hell because that's murder, I can't do that."   Spoke with patients wife who reports that she does not "feel safe" with her husband returning home because he has been "unusually quiet" today and "is just not himself." Patients wife states "he is sick and he needs help and every time i send him there you send  him home." Patients wife was informed that psychiatry does not place patients who need assisted living. Patients wife states that he told her before his last admission "if you call the police again you'll be sorry" and reports that she feels that he will be violent if discharged tonight. Patients daughter later came to the hospital and reports that she feels that the patients wife is "provoking" the patient to become angry "just so that she can call the police." Patients daughter, Anthony Skinner, requests to speak with a CSW. This Probation officer requested CSW speak with patient and his daughter.   Consulted with Patriciaann Clan, PA-C who recommends patient be observed overnight and see psychiatry in the morning. Informed EDP of disposition who agrees.    Diagnosis: Early Onset Dementia  Past Medical History:  Past Medical History  Diagnosis Date  . Iron deficiency anemia   . Tubular adenoma of colon 2012  . Gastropathy 2012    reactive  . PUD (peptic ulcer disease)   . Prostate cancer ([place])     a. 09/2008 s/p prostatectomy.  . CAD (coronary artery disease)     a. reported h/o MI in the 42's;  b. 04/2000 Cath: LM nl, LAD 40p, D1 small, nl, RI nl, LCX nl, RCA nl.  . Hyperlipidemia   . Vertebral artery stenosis     a. 09/2010 s/p L vertebral stenting 09/2010.  Marland Kitchen GERD (gastroesophageal reflux disease)   . Hiatal hernia   . Recurrent spontaneous pneumothorax     a. s/p L lobectomy in 1966.  Marland Kitchen DJD (  degenerative joint disease)   . Hypertension   . Upper GI bleed     a. 2012  . Fatty liver   . Hypertension   . Syncope     a. in setting of GIB in 2012, presumed to be orthostatic.  . Suicide attempt ([place])     a. 08/2013 attempt by hanging with subsequent resp failure  . Myocardial infarction ([place])     " BACK IN THE 90'S"  . Dementia   . Shortness of breath     Past Surgical History  Procedure Laterality Date  . Prostatectomy    . Vertebral artery stent    . Lung removal, partial  1960s    left  .  Cataract extraction Right   . Cardiac surgery    . Cardiac catheterization  09/15/2013  . Left heart catheterization with coronary angiogram N/A 09/15/2013    Procedure: LEFT HEART CATHETERIZATION WITH CORONARY ANGIOGRAM;  Surgeon: Burnell Blanks, MD;  Location: [facility] CATH LAB;  Service: Cardiovascular;  Laterality: N/A;    Family History:  Family History  Problem Relation Age of Onset  . Heart attack Father   . Alzheimer's disease Mother   . Prostate cancer Brother     Social History:  reports that he quit smoking about 13 years ago. His smoking use included Cigarettes. He has a 40 pack-year smoking history. He has never used smokeless tobacco. He reports that he does not drink alcohol or use illicit drugs.  Additional Social History:  Alcohol / Drug Use Pain Medications: See PTA Prescriptions: See PTA Over the Counter: See PTA History of alcohol / drug use?: No history of alcohol / drug abuse  CIWA: CIWA-Ar BP: 140/68 mmHg Pulse Rate: 70 COWS:    Allergies: No Known Allergies  Home Medications:  (Not in a hospital admission)  OB/GYN Status:  No LMP for male patient.  General Assessment Data Location of Assessment: [facility] TTS Assessment: In system Is this a Tele or Face-to-Face Assessment?: Face-to-Face Is this an Initial Assessment or a Re-assessment for this encounter?: Initial Assessment Marital status: Married (32 years) Is patient pregnant?: No Pregnancy Status: No Living Arrangements: Spouse/significant other Can pt return to current living arrangement?: Yes Admission Status: Voluntary Is patient capable of signing voluntary admission?: Yes Referral Source: Self/Family/Friend     Crisis Care Plan Living Arrangements: Spouse/significant other Name of Psychiatrist: Dr. Zeb Comfort (every three weeks) Name of Therapist: None  Education Status Is patient currently in school?: No Highest grade of school patient has completed: GED  Risk to self with the past  6 months Suicidal Ideation: No Has patient been a risk to self within the past 6 months prior to admission? : No Suicidal Intent: No Has patient had any suicidal intent within the past 6 months prior to admission? : No Is patient at risk for suicide?: No Suicidal Plan?: No Has patient had any suicidal plan within the past 6 months prior to admission? : No Access to Means: No What has been your use of drugs/alcohol within the last 12 months?: Denies Previous Attempts/Gestures: Yes How many times?: 1 (three years ago) Other Self Harm Risks: Denies Triggers for Past Attempts: Unknown Intentional Self Injurious Behavior: None Family Suicide History: No Recent stressful life event(s): Recent negative physical changes (Dementia) Persecutory voices/beliefs?: No Depression: No Depression Symptoms:  (denies) Substance abuse history and/or treatment for substance abuse?: Yes Suicide prevention information given to non-admitted patients: Not applicable  Risk to Others within the past 6  months Homicidal Ideation: No Does patient have any lifetime risk of violence toward others beyond the six months prior to admission? : No Thoughts of Harm to Others: No Current Homicidal Intent: No Current Homicidal Plan: No Access to Homicidal Means: No Identified Victim: Denies History of harm to others?: No Assessment of Violence: None Noted Violent Behavior Description: Denies Does patient have access to weapons?: No Criminal Charges Pending?: No Does patient have a court date: No Is patient on probation?: No  Psychosis Hallucinations: None noted Delusions: None noted  Mental Status Report Appearance/Hygiene: Unremarkable Eye Contact: Good Motor Activity: Freedom of movement Speech: Logical/coherent Level of Consciousness: Alert Mood: Pleasant Affect: Appropriate to circumstance Anxiety Level: Minimal Thought Processes: Coherent, Relevant Judgement: Unimpaired Orientation: Person, Place,  Time, Situation, Appropriate for developmental age Obsessive Compulsive Thoughts/Behaviors: None  Cognitive Functioning Concentration: Normal Memory: Recent Intact, Remote Intact IQ: Average Insight: Good Impulse Control: Good Appetite: Good Sleep: No Change Total Hours of Sleep: 7 Vegetative Symptoms: None  ADLScreening [facility] Assessment Services) Patient's cognitive ability adequate to safely complete daily activities?: Yes Patient able to express need for assistance with ADLs?: Yes Independently performs ADLs?: Yes (appropriate for developmental age)  Prior Inpatient Therapy Prior Inpatient Therapy: Yes Prior Therapy Dates: 2014, 2016, 2014 Prior Therapy Facilty/Provider(s): [facility]  Reason for Treatment: Depression  Prior Outpatient Therapy Prior Outpatient Therapy: Yes Prior Therapy Dates: 2016- Present Prior Therapy Facilty/Provider(s): Dr. Zeb Comfort Reason for Treatment: Dementia Does patient have an ACCT team?: No Does patient have Intensive In-House Services?  : No Does patient have [facility] services? : No Does patient have P4CC services?: No  ADL Screening (condition at time of admission) Patient's cognitive ability adequate to safely complete daily activities?: Yes Is the patient deaf or have difficulty hearing?: No Does the patient have difficulty seeing, even when wearing glasses/contacts?: No Does the patient have difficulty concentrating, remembering, or making decisions?: No Patient able to express need for assistance with ADLs?: Yes Does the patient have difficulty dressing or bathing?: No Independently performs ADLs?: Yes (appropriate for developmental age) Does the patient have difficulty walking or climbing stairs?: Yes Weakness of Legs: None Weakness of Arms/Hands: None  Home Assistive Devices/Equipment Home Assistive Devices/Equipment: None  Therapy Consults (therapy consults require a physician order) PT Evaluation Needed: No OT  Evalulation Needed: No SLP Evaluation Needed: No Abuse/Neglect Assessment (Assessment to be complete while patient is alone) Physical Abuse: Denies Verbal Abuse: Denies Sexual Abuse: Denies Exploitation of patient/patient's resources: Denies Self-Neglect: Denies Values / Beliefs Cultural Requests During Hospitalization: None Spiritual Requests During Hospitalization: None Consults Spiritual Care Consult Needed: No Social Work Consult Needed: No Regulatory affairs officer (For Healthcare) Does patient have an advance directive?: Yes Type of Advance Directive: Living will Copy of advanced directive(s) in chart?: No - copy requested    Additional Information 1:1 In Past 12 Months?: No CIRT Risk: No Elopement Risk: No Does patient have medical clearance?: No     Disposition:  Disposition Initial Assessment Completed for this Encounter: Yes Disposition of Patient: Other dispositions (observe overnight per Patriciaann Clan, PA-C) Type of inpatient treatment program: Adult  On Site Evaluation by:   Reviewed with Physician:    Delissa Silba 03/12/2016 10:59 PM

## 2016-03-12 NOTE — ED Notes (Signed)
UNABLE TO COLLECT LABS AT THIS TIME TTS IS TALKING WITH PATIENT
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
## 2016-03-13 DIAGNOSIS — F0391 Unspecified dementia with behavioral disturbance: Secondary | ICD-10-CM | POA: Diagnosis not present

## 2016-03-13 LAB — URINALYSIS, ROUTINE W REFLEX MICROSCOPIC
Bilirubin Urine: NEGATIVE
Glucose, UA: NEGATIVE mg/dL
Ketones, ur: NEGATIVE mg/dL
Leukocytes, UA: NEGATIVE
Nitrite: NEGATIVE
Protein, ur: NEGATIVE mg/dL
Specific Gravity, Urine: 1.009 (ref 1.005–1.030)
pH: 7 (ref 5.0–8.0)

## 2016-03-13 LAB — URINE MICROSCOPIC-ADD ON
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
## 2016-03-13 NOTE — ED Notes (Signed)
Pt belongings placed in locker number 32

## 2016-03-13 NOTE — [facility] Suicide Risk Assessment (Cosign Needed)
Suicide Risk Assessment  Discharge Assessment   [facility] Discharge Suicide Risk Assessment   Principal Problem: Dementia with behavioral disturbance Discharge Diagnoses:  Patient Active Problem List   Diagnosis Date Noted  . Dementia with behavioral disturbance [F03.91] 09/01/2015    Priority: High  . Major depressive disorder without psychotic features ([place]) [F32.9] 05/24/2015    Priority: High  . Suicidal ideation [R45.851]   . Dementia [F03.90]   . Aggressive behavior [F60.89]   . Chest pain [R07.9] 09/15/2013  . Coronary atherosclerosis of native coronary artery [I25.10] 09/15/2013  . Hypokalemia [E87.6] 08/28/2013  . Acute respiratory failure ([place]) [J96.00] 08/25/2013  . Altered mental status [R41.82] 08/25/2013  . Anoxic brain injury ([place]) [G93.1] 08/25/2013  . HTN (hypertension) [I10] 08/25/2013  . Suicide attempt ([place]) [T14.91] 08/25/2013  . Vitamin B 12 deficiency [E53.8] 07/15/2013  . Unspecified hereditary and idiopathic peripheral neuropathy [G60.9] 07/15/2013  . Memory loss [R41.3] 07/15/2013    Total Time spent with patient: 20 minutes  Musculoskeletal: Strength & Muscle Tone: within normal limits Gait & Station: normal Patient leans: N/A  Psychiatric Specialty Exam:   Blood pressure 162/68, pulse 83, temperature 97.9 F (36.6 C), temperature source Oral, resp. rate 16, height 6' (1.829 m), weight 73.483 kg (162 lb), SpO2 97 %.Body mass index is 21.97 kg/(m^2).  General Appearance: Casual and Fairly Groomed  Engineer, water:: Good  Speech: Clear and Coherent and Normal Rate  Volume: Normal  Mood: Euthymic  Affect: Congruent and Depressed  Thought Process: Coherent, Goal Directed and Intact  Orientation: Full (Time, Place, and Person)  Thought Content: WDL  Suicidal Thoughts: No  Homicidal Thoughts: No  Memory: Immediate; Good Recent; Good Remote; Good  Judgement: Other: fair secondary to Dementia diagnosis  Insight: Fair   Psychomotor Activity: Normal  Concentration: Good  Recall: Poor  Fund of Knowledge:Fair  Language: Good  Akathisia: No  Handed: Right  AIMS (if indicated):    Assets: Desire for Improvement  ADL's: Intact  Cognition: Impaired, Moderate         Mental Status Per Nursing Assessment::   On Admission:     Demographic Factors:  Male, Age 1 or older, Caucasian and Unemployed  Loss Factors: NA  Historical Factors: Dementia with behavior problem  Risk Reduction Factors:   Religious beliefs about death and Living with another person, especially a relative  Continued Clinical Symptoms:  Previous Psychiatric Diagnoses and Treatments  Cognitive Features That Contribute To Risk:  Loss of executive function    Suicide Risk:  Minimal: No identifiable suicidal ideation.  Patients presenting with no risk factors but with morbid ruminations; may be classified as minimal risk based on the severity of the depressive symptoms    Plan Of Care/Follow-up recommendations:  Activity:  as tolerated Diet:  regular  Delfin Gant, NP   PMHNP-BC 03/13/2016, 12:55 PM

## 2016-03-13 NOTE — ED Notes (Signed)
Pt states that he just voided in bathroom and did not obtain urine sample because he wasn't aware it was needed. Specimen cup at bedside and pt verbalizing understanding to notify staff once he is able to obtain specimen.

## 2016-03-13 NOTE — ED Notes (Signed)
Wife at bedside. Josephine NP notified.

## 2016-03-13 NOTE — ED Notes (Addendum)
Anthony Agent, NP in [place] would like to speak with the wife when she visits.  Patient has been informed that if wife is agreeable, he may be discharged this afternoon.

## 2016-03-13 NOTE — ED Notes (Signed)
Patient is calm and cooperative.  He is pleasant upon approach.  He ate breakfast this morning.  No medications due.  No aggressive behavior note.  Patient's wife called and wants to visit.  Informed her to let front desk know and security will escort her back here.  He denies SI/HI/AVH.

## 2016-03-19 ENCOUNTER — Emergency Department (HOSPITAL_COMMUNITY)
Admission: EM | Admit: 2016-03-19 | Discharge: 2016-03-20 | Disposition: A | Discharge status: Home / Self Care | Payer: Medicare Other | Attending: Emergency Medicine | Admitting: Emergency Medicine

## 2016-03-19 ENCOUNTER — Encounter (HOSPITAL_COMMUNITY): Payer: Self-pay | Admitting: Emergency Medicine

## 2016-03-19 DIAGNOSIS — Z8709 Personal history of other diseases of the respiratory system: Secondary | ICD-10-CM | POA: Insufficient documentation

## 2016-03-19 DIAGNOSIS — I10 Essential (primary) hypertension: Secondary | ICD-10-CM | POA: Insufficient documentation

## 2016-03-19 DIAGNOSIS — I2510 Atherosclerotic heart disease of native coronary artery without angina pectoris: Secondary | ICD-10-CM | POA: Insufficient documentation

## 2016-03-19 DIAGNOSIS — Z008 Encounter for other general examination: Secondary | ICD-10-CM | POA: Diagnosis present

## 2016-03-19 DIAGNOSIS — Z86018 Personal history of other benign neoplasm: Secondary | ICD-10-CM | POA: Insufficient documentation

## 2016-03-19 DIAGNOSIS — Z7982 Long term (current) use of aspirin: Secondary | ICD-10-CM | POA: Insufficient documentation

## 2016-03-19 DIAGNOSIS — M1990 Unspecified osteoarthritis, unspecified site: Secondary | ICD-10-CM | POA: Diagnosis not present

## 2016-03-19 DIAGNOSIS — Z8639 Personal history of other endocrine, nutritional and metabolic disease: Secondary | ICD-10-CM | POA: Insufficient documentation

## 2016-03-19 DIAGNOSIS — Z8546 Personal history of malignant neoplasm of prostate: Secondary | ICD-10-CM | POA: Insufficient documentation

## 2016-03-19 DIAGNOSIS — F0391 Unspecified dementia with behavioral disturbance: Secondary | ICD-10-CM | POA: Insufficient documentation

## 2016-03-19 DIAGNOSIS — I252 Old myocardial infarction: Secondary | ICD-10-CM | POA: Insufficient documentation

## 2016-03-19 DIAGNOSIS — Z8719 Personal history of other diseases of the digestive system: Secondary | ICD-10-CM | POA: Insufficient documentation

## 2016-03-19 DIAGNOSIS — Z862 Personal history of diseases of the blood and blood-forming organs and certain disorders involving the immune mechanism: Secondary | ICD-10-CM | POA: Insufficient documentation

## 2016-03-19 DIAGNOSIS — Z87891 Personal history of nicotine dependence: Secondary | ICD-10-CM | POA: Diagnosis not present

## 2016-03-19 DIAGNOSIS — Z79899 Other long term (current) drug therapy: Secondary | ICD-10-CM | POA: Insufficient documentation

## 2016-03-19 DIAGNOSIS — F03918 Unspecified dementia, unspecified severity, with other behavioral disturbance: Secondary | ICD-10-CM

## 2016-03-19 DIAGNOSIS — Z8711 Personal history of peptic ulcer disease: Secondary | ICD-10-CM | POA: Diagnosis not present

## 2016-03-19 NOTE — ED Notes (Signed)
Pt BIB [facility] after being IVC'd by his wife. Pt has dementia and per paperwork he has been threatening her and has tried to hang himself in the past but not today. Pt is calm and cooperative at this time. Alert and oriented.

## 2016-03-20 LAB — COMPREHENSIVE METABOLIC PANEL
ALT: 13 U/L — ABNORMAL LOW (ref 17–63)
AST: 21 U/L (ref 15–41)
Albumin: 4.6 g/dL (ref 3.5–5.0)
Alkaline Phosphatase: 68 U/L (ref 38–126)
Anion gap: 9 (ref 5–15)
BUN: 17 mg/dL (ref 6–20)
CO2: 28 mmol/L (ref 22–32)
Calcium: 9.6 mg/dL (ref 8.9–10.3)
Chloride: 104 mmol/L (ref 101–111)
Creatinine, Ser: 1.35 mg/dL — ABNORMAL HIGH (ref 0.61–1.24)
GFR calc Af Amer: 59 mL/min — ABNORMAL LOW (ref >60)
GFR calc non Af Amer: 51 mL/min — ABNORMAL LOW (ref >60)
Glucose, Bld: 126 mg/dL — ABNORMAL HIGH (ref 65–99)
Potassium: 4.1 mmol/L (ref 3.5–5.1)
Sodium: 141 mmol/L (ref 135–145)
Total Bilirubin: 0.6 mg/dL (ref 0.3–1.2)
Total Protein: 7.8 g/dL (ref 6.5–8.1)

## 2016-03-20 LAB — SALICYLATE LEVEL: Salicylate Lvl: <4 mg/dL (ref 2.8–30.0)

## 2016-03-20 LAB — ACETAMINOPHEN LEVEL: Acetaminophen (Tylenol), Serum: <10 ug/mL — ABNORMAL LOW (ref 10–30)

## 2016-03-20 LAB — RAPID URINE DRUG SCREEN, HOSP PERFORMED
Amphetamines: NOT DETECTED
Barbiturates: NOT DETECTED
Benzodiazepines: NOT DETECTED
Cocaine: NOT DETECTED
Opiates: NOT DETECTED
Tetrahydrocannabinol: NOT DETECTED

## 2016-03-20 LAB — CBC
HCT: 41.1 % (ref 39.0–52.0)
Hemoglobin: 13.7 g/dL (ref 13.0–17.0)
MCH: 28 pg (ref 26.0–34.0)
MCHC: 33.3 g/dL (ref 30.0–36.0)
MCV: 83.9 fL (ref 78.0–100.0)
Platelets: 254 10*3/uL (ref 150–400)
RBC: 4.9 MIL/uL (ref 4.22–5.81)
RDW: 15.1 % (ref 11.5–15.5)
WBC: 5 10*3/uL (ref 4.0–10.5)

## 2016-03-20 LAB — ETHANOL: Alcohol, Ethyl (B): <5 mg/dL (ref <5)

## 2016-03-20 NOTE — ED Provider Notes (Signed)
CSN: [id number]     Arrival date & time 03/19/16  2352 History   By signing my name below, I, Forrestine Him, attest that this documentation has been prepared under the direction and in the presence of Daleen Bo, MD.  Electronically Signed: Forrestine Him, ED Scribe. 03/20/2016. 12:13 AM.   Chief Complaint  Patient presents with  . IVC    The history is provided by the patient. No language interpreter was used.    HPI Comments: DORRIEN OBIE is a 75 y.o. male with a PMHx of CAD, hyperlipidemia, HTN, MI, and SI who presents to the Emergency Department here under IVC paperwork this evening. Pt states "me and my wife got into an argument and she told me i need to come over here". Pt states typically when him and his wife get into an argument, his wife calls the police on him. He admits to a suicide attempt 2 years ago by hanging himself. However, he denies any SI or HI at this time. Pt is followed by a Psychiatrist with last follow up 3 weeks ago. Mr. Mcevoy was recently evaluated in the Emergency Department on 3/28 for aggressive behavior. At that time, pt was kept overnight and was seen by social work and psychiatry.  PCP: Charolette Forward, MD    Past Medical History  Diagnosis Date  . Iron deficiency anemia   . Tubular adenoma of colon 2012  . Gastropathy 2012    reactive  . PUD (peptic ulcer disease)   . Prostate cancer ([place])     a. 09/2008 s/p prostatectomy.  . CAD (coronary artery disease)     a. reported h/o MI in the 32's;  b. 04/2000 Cath: LM nl, LAD 40p, D1 small, nl, RI nl, LCX nl, RCA nl.  . Hyperlipidemia   . Vertebral artery stenosis     a. 09/2010 s/p L vertebral stenting 09/2010.  Marland Kitchen GERD (gastroesophageal reflux disease)   . Hiatal hernia   . Recurrent spontaneous pneumothorax     a. s/p L lobectomy in 1966.  Marland Kitchen DJD (degenerative joint disease)   . Hypertension   . Upper GI bleed     a. 2012  . Fatty liver   . Hypertension   . Syncope     a. in setting of GIB in  2012, presumed to be orthostatic.  . Suicide attempt ([place])     a. 08/2013 attempt by hanging with subsequent resp failure  . Myocardial infarction ([place])     " BACK IN THE 90'S"  . Dementia   . Shortness of breath    Past Surgical History  Procedure Laterality Date  . Prostatectomy    . Vertebral artery stent    . Lung removal, partial  1960s    left  . Cataract extraction Right   . Cardiac surgery    . Cardiac catheterization  09/15/2013  . Left heart catheterization with coronary angiogram N/A 09/15/2013    Procedure: LEFT HEART CATHETERIZATION WITH CORONARY ANGIOGRAM;  Surgeon: Burnell Blanks, MD;  Location: [facility] CATH LAB;  Service: Cardiovascular;  Laterality: N/A;   Family History  Problem Relation Age of Onset  . Heart attack Father   . Alzheimer's disease Mother   . Prostate cancer Brother    Social History  Substance Use Topics  . Smoking status: Former Smoker -- 1.00 packs/day for 40 years    Types: Cigarettes    Quit date: 08/27/2002  . Smokeless tobacco: Never Used  . Alcohol Use:  No     Comment: Hx heavy EtOH use but quit 2011    Review of Systems  Constitutional: Negative for fever and chills.  Respiratory: Negative for shortness of breath.   Cardiovascular: Negative for chest pain.  Psychiatric/Behavioral: Negative for suicidal ideas and confusion.  All other systems reviewed and are negative.     Allergies  Review of patient's allergies indicates no known allergies.  Home Medications   Prior to Admission medications   Medication Sig Start Date End Date Taking? Authorizing Provider  aspirin 81 MG tablet Take 81 mg by mouth daily.   Yes Historical Provider, MD  chlorproMAZINE (THORAZINE) 50 MG tablet Take 50-100 mg by mouth as directed. Take 100mg s in the morning 50mg s in the afternoon and 100mg s at bedtime 01/22/16  Yes Historical Provider, MD  lamoTRIgine (LAMICTAL) 100 MG tablet Take 100 mg by mouth 2 (two) times daily. 10/04/15  Yes Historical  Provider, MD  mirtazapine (REMERON) 30 MG tablet Take 30 mg by mouth at bedtime.  05/30/15  Yes Historical Provider, MD  risperiDONE (RISPERDAL) 0.5 MG tablet Take 1 tablet (0.5 mg total) by mouth at bedtime. 06/13/15  Yes Delfin Gant, NP  sertraline (ZOLOFT) 25 MG tablet Take 25 mg by mouth 3 (three) times daily.  01/02/16  Yes Historical Provider, MD   Triage Vitals: BP 164/80 mmHg  Pulse 69  Temp(Src) 97.7 F (36.5 C) (Oral)  Resp 16  SpO2 100%   Physical Exam  Constitutional: He is oriented to person, place, and time. He appears well-developed and well-nourished.  HENT:  Head: Normocephalic and atraumatic.  Right Ear: External ear normal.  Left Ear: External ear normal.  Eyes: Conjunctivae and EOM are normal. Pupils are equal, round, and reactive to light.  Neck: Normal range of motion and phonation normal. Neck supple.  Cardiovascular: Normal rate, regular rhythm and normal heart sounds.   Pulmonary/Chest: Effort normal and breath sounds normal. He exhibits no bony tenderness.  Abdominal: Soft. There is no tenderness.  Musculoskeletal: Normal range of motion.  Neurological: He is alert and oriented to person, place, and time. No cranial nerve deficit or sensory deficit. He exhibits normal muscle tone. Coordination normal.  Skin: Skin is warm, dry and intact.  Psychiatric: He has a normal mood and affect. His behavior is normal. Judgment and thought content normal.  Nursing note and vitals reviewed.   ED Course  Procedures (including critical care time)  DIAGNOSTIC STUDIES: Oxygen Saturation is 100% on RA, Normal by my interpretation.    COORDINATION OF CARE: 12:07 AM- Will order blood work and urine rapid drug screen. Discussed treatment plan with pt at bedside and pt agreed to plan.    Medications - No data to display  Patient Vitals for the past 24 hrs:  BP Temp Temp src Pulse Resp SpO2  03/19/16 2354 164/80 mmHg 97.7 F (36.5 C) Oral 69 16 100 %    Chart  reviewed. Multiple prior visits to the emergency department, in the last month. One short term stay in a psychiatric hospital is noted. Patient appears to be at his baseline. He does not appear to be any risk to his wife or himself.  IVC rescinded, by first opinion paperwork, by me.   12:35 AM Reevaluation with update and discussion. After initial assessment and treatment, an updated evaluation reveals no change in clinical status. Findings discussed with patient, all questions were. [place] Reviewed  CBC  URINE RAPID  DRUG SCREEN, HOSP PERFORMED  COMPREHENSIVE METABOLIC PANEL  ETHANOL  SALICYLATE LEVEL  ACETAMINOPHEN LEVEL    Imaging Review No results found. I have personally reviewed and evaluated these images and lab results as part of my medical decision-making.   EKG Interpretation None      MDM   Final diagnoses:  Dementia with behavioral disturbance    Recurrent argumentative behavior. Patient with history of dementia, mild. He does not appear to be a rest or himself or his wife at this time.  Nursing Notes Reviewed/ Care Coordinated Applicable Imaging Reviewed Interpretation of Laboratory Data incorporated into ED treatment  The patient appears reasonably screened and/or stabilized for discharge and I doubt any other medical condition or other [facility] requiring further screening, evaluation, or treatment in the ED at this time prior to discharge.  Plan: Home Medications- usual; Home Treatments- rest; return here if the recommended treatment, does not improve the symptoms; Recommended follow up- Psychiatry tomorrow as scheduled.   I personally performed the services described in this documentation, which was scribed in my presence. The recorded information has been reviewed and is accurate.    Daleen Bo, MD 03/20/16 [phone]

## 2016-03-20 NOTE — Discharge Instructions (Signed)
See her psychiatrist tomorrow, as scheduled. Do not argue with your wife.  Dementia Dementia is a general term for problems with brain function. A person with dementia has memory loss and a hard time with at least one other brain function such as thinking, speaking, or problem solving. Dementia can affect social functioning, how you do your job, your mood, or your personality. The changes may be hidden for a long time. The earliest forms of this disease are usually not detected by family or friends. Dementia can be:  Irreversible.  Potentially reversible.  Partially reversible.  Progressive. This means it can get worse over time. CAUSES  Irreversible dementia causes may include:  Degeneration of brain cells (Alzheimer disease or Lewy body dementia).  Multiple small strokes (vascular dementia).  Infection (chronic meningitis or Creutzfeldt-Jakob disease).  Frontotemporal dementia. This affects younger people, age 15 to 58, compared to those who have Alzheimer disease.  Dementia associated with other disorders like Parkinson disease, Huntington disease, or HIV-associated dementia. Potentially or partially reversible dementia causes may include:  Medicines.  Metabolic causes such as excessive alcohol intake, vitamin B12 deficiency, or thyroid disease.  Masses or pressure in the brain such as a tumor, blood clot, or hydrocephalus. SIGNS AND SYMPTOMS  Symptoms are often hard to detect. Family members or coworkers may not notice them early in the disease process. Different people with dementia may have different symptoms. Symptoms can include:  A hard time with memory, especially recent memory. Long-term memory may not be impaired.  Asking the same question multiple times or forgetting something someone just said.  A hard time speaking your thoughts or finding certain words.  A hard time solving problems or performing familiar tasks (such as how to use a telephone).  Sudden  changes in mood.  Changes in personality, especially increasing moodiness or mistrust.  Depression.  A hard time understanding complex ideas that were never a problem in the past. DIAGNOSIS  There are no specific tests for dementia.   Your health care provider may recommend a thorough evaluation. This is because some forms of dementia can be reversible. The evaluation will likely include a physical exam and getting a detailed history from you and a family member. The history often gives the best clues and suggestions for a diagnosis.  Memory testing may be done. A detailed brain function evaluation called neuropsychologic testing may be helpful.  Lab tests and brain imaging (such as a CT scan or MRI scan) are sometimes important.  Sometimes observation and re-evaluation over time is very helpful. TREATMENT  Treatment depends on the cause.   If the problem is a vitamin deficiency, it may be helped or cured with supplements.  For dementias such as Alzheimer disease, medicines are available to stabilize or slow the course of the disease. There are no cures for this type of dementia.  Your health care provider can help direct you to groups, organizations, and other health care providers to help with decisions in the care of you or your loved one. HOME CARE INSTRUCTIONS The care of individuals with dementia is varied and dependent upon the progression of the dementia. The following suggestions are intended for the person living with, or caring for, the person with dementia.  Create a safe environment.  Remove the locks on bathroom doors to prevent the person from accidentally locking himself or herself in.  Use childproof latches on kitchen cabinets and any place where cleaning supplies, chemicals, or alcohol are kept.  Use childproof covers  in unused electrical outlets.  Install childproof devices to keep doors and windows secured.  Remove stove knobs or install safety knobs and an  automatic shut-off on the stove.  Lower the temperature on water heaters.  Label medicines and keep them locked up.  Secure knives, lighters, matches, power tools, and guns, and keep these items out of reach.  Keep the house free from clutter. Remove rugs or anything that might contribute to a fall.  Remove objects that might break and hurt the person.  Make sure lighting is good, both inside and outside.  Install grab rails as needed.  Use a monitoring device to alert you to falls or other needs for help.  Reduce confusion.  Keep familiar objects and people around.  Use night lights or dim lights at night.  Label items or areas.  Use reminders, notes, or directions for daily activities or tasks.  Keep a simple, consistent routine for waking, meals, bathing, dressing, and bedtime.  Create a calm, quiet environment.  Place large clocks and calendars prominently.  Display emergency numbers and home address near all telephones.  Use cues to establish different times of the day. An example is to open curtains to let the natural light in during the day.   Use effective communication.  Choose simple words and short sentences.  Use a gentle, calm tone of voice.  Be careful not to interrupt.  If the person is struggling to find a word or communicate a thought, try to provide the word or thought.  Ask one question at a time. Allow the person ample time to answer questions. Repeat the question again if the person does not respond.  Reduce nighttime restlessness.  Provide a comfortable bed.  Have a consistent nighttime routine.  Ensure a regular walking or physical activity schedule. Involve the person in daily activities as much as possible.  Limit napping during the day.  Limit caffeine.  Attend social events that stimulate rather than overwhelm the senses.  Encourage good nutrition and hydration.  Reduce distractions during meal times and snacks.  Avoid  foods that are too hot or too cold.  Monitor chewing and swallowing ability.  Continue with routine vision, hearing, dental, and medical screenings.  Give medicines only as directed by the health care provider.  Monitor driving abilities. Do not allow the person to drive when safe driving is no longer possible.  Register with an identification program which could provide location assistance in the event of a missing person situation. SEEK MEDICAL CARE IF:   New behavioral problems start such as moodiness, aggressiveness, or seeing things that are not there (hallucinations).  Any new problem with brain function happens. This includes problems with balance, speech, or falling a lot.  Problems with swallowing develop.  Any symptoms of other illness happen. Small changes or worsening in any aspect of brain function can be a sign that the illness is getting worse. It can also be a sign of another medical illness such as infection. Seeing a health care provider right away is important. SEEK IMMEDIATE MEDICAL CARE IF:   A fever develops.  New or worsened confusion develops.  New or worsened sleepiness develops.  Staying awake becomes hard to do.   This information is not intended to replace advice given to you by your health care provider. Make sure you discuss any questions you have with your health care provider.   Document Released: 05/28/2001 Document Revised: 12/23/2014 Document Reviewed: 04/29/2011 Elsevier Interactive Patient Education 2016  Elsevier Inc. ° °

## 2016-03-20 NOTE — ED Notes (Signed)
Pt's IVC paperwork rescinded by Dr. Eulis Foster. Wife called to pick up pt.

## 2016-03-20 NOTE — ED Notes (Addendum)
Pt. In burgundy scrubs. Pt. And belongings searched and wanded by security. Pt. Has 1 belongings bag. Pt. Has blue jean, 1 brown belt, 1 gray shirt, 1 white t-shirt, 1 pr. Socks, and 1 pr. Brown shoes,watch, wallet($3.00 cash), no cell phone. Pt belongings locked up at the nurses station in triage.

## 2016-03-20 NOTE — ED Notes (Signed)
Patient was alert, oriented and stable upon discharge. RN went over AVS and patient had no further questions.  

## 2016-04-26 ENCOUNTER — Emergency Department (HOSPITAL_COMMUNITY)
Admission: EM | Admit: 2016-04-26 | Discharge: 2016-04-27 | Disposition: A | Discharge status: Home / Self Care | Payer: Medicare Other | Attending: Emergency Medicine | Admitting: Emergency Medicine

## 2016-04-26 ENCOUNTER — Encounter (HOSPITAL_COMMUNITY): Payer: Self-pay | Admitting: *Deleted

## 2016-04-26 DIAGNOSIS — F919 Conduct disorder, unspecified: Secondary | ICD-10-CM | POA: Diagnosis present

## 2016-04-26 DIAGNOSIS — I10 Essential (primary) hypertension: Secondary | ICD-10-CM | POA: Insufficient documentation

## 2016-04-26 DIAGNOSIS — I2510 Atherosclerotic heart disease of native coronary artery without angina pectoris: Secondary | ICD-10-CM | POA: Insufficient documentation

## 2016-04-26 DIAGNOSIS — E785 Hyperlipidemia, unspecified: Secondary | ICD-10-CM | POA: Insufficient documentation

## 2016-04-26 DIAGNOSIS — F0391 Unspecified dementia with behavioral disturbance: Secondary | ICD-10-CM

## 2016-04-26 DIAGNOSIS — Z87891 Personal history of nicotine dependence: Secondary | ICD-10-CM | POA: Diagnosis not present

## 2016-04-26 DIAGNOSIS — K219 Gastro-esophageal reflux disease without esophagitis: Secondary | ICD-10-CM | POA: Diagnosis not present

## 2016-04-26 DIAGNOSIS — F03918 Unspecified dementia, unspecified severity, with other behavioral disturbance: Secondary | ICD-10-CM

## 2016-04-26 DIAGNOSIS — Z79899 Other long term (current) drug therapy: Secondary | ICD-10-CM | POA: Insufficient documentation

## 2016-04-26 DIAGNOSIS — F0390 Unspecified dementia without behavioral disturbance: Secondary | ICD-10-CM | POA: Insufficient documentation

## 2016-04-26 DIAGNOSIS — M1990 Unspecified osteoarthritis, unspecified site: Secondary | ICD-10-CM | POA: Diagnosis not present

## 2016-04-26 DIAGNOSIS — Z8546 Personal history of malignant neoplasm of prostate: Secondary | ICD-10-CM | POA: Insufficient documentation

## 2016-04-26 DIAGNOSIS — Z048 Encounter for examination and observation for other specified reasons: Secondary | ICD-10-CM | POA: Insufficient documentation

## 2016-04-26 DIAGNOSIS — I252 Old myocardial infarction: Secondary | ICD-10-CM | POA: Diagnosis not present

## 2016-04-26 DIAGNOSIS — Z7982 Long term (current) use of aspirin: Secondary | ICD-10-CM | POA: Diagnosis not present

## 2016-04-26 LAB — COMPREHENSIVE METABOLIC PANEL
ALT: 14 U/L — ABNORMAL LOW (ref 17–63)
AST: 20 U/L (ref 15–41)
Albumin: 4.2 g/dL (ref 3.5–5.0)
Alkaline Phosphatase: 62 U/L (ref 38–126)
Anion gap: 9 (ref 5–15)
BUN: 14 mg/dL (ref 6–20)
CO2: 27 mmol/L (ref 22–32)
Calcium: 9.4 mg/dL (ref 8.9–10.3)
Chloride: 105 mmol/L (ref 101–111)
Creatinine, Ser: 1.17 mg/dL (ref 0.61–1.24)
GFR calc Af Amer: >60 mL/min (ref >60)
GFR calc non Af Amer: >60 mL/min (ref >60)
Glucose, Bld: 108 mg/dL — ABNORMAL HIGH (ref 65–99)
Potassium: 4.2 mmol/L (ref 3.5–5.1)
Sodium: 141 mmol/L (ref 135–145)
Total Bilirubin: 0.4 mg/dL (ref 0.3–1.2)
Total Protein: 7.1 g/dL (ref 6.5–8.1)

## 2016-04-26 LAB — RAPID URINE DRUG SCREEN, HOSP PERFORMED
Amphetamines: NOT DETECTED
Barbiturates: NOT DETECTED
Benzodiazepines: NOT DETECTED
Cocaine: NOT DETECTED
Opiates: NOT DETECTED
Tetrahydrocannabinol: NOT DETECTED

## 2016-04-26 LAB — CBC
HCT: 41 % (ref 39.0–52.0)
Hemoglobin: 13.9 g/dL (ref 13.0–17.0)
MCH: 28.3 pg (ref 26.0–34.0)
MCHC: 33.9 g/dL (ref 30.0–36.0)
MCV: 83.3 fL (ref 78.0–100.0)
Platelets: 216 10*3/uL (ref 150–400)
RBC: 4.92 MIL/uL (ref 4.22–5.81)
RDW: 15 % (ref 11.5–15.5)
WBC: 5.6 10*3/uL (ref 4.0–10.5)

## 2016-04-26 LAB — SALICYLATE LEVEL: Salicylate Lvl: <4 mg/dL (ref 2.8–30.0)

## 2016-04-26 LAB — ACETAMINOPHEN LEVEL: Acetaminophen (Tylenol), Serum: <10 ug/mL — ABNORMAL LOW (ref 10–30)

## 2016-04-26 LAB — ETHANOL: Alcohol, Ethyl (B): <5 mg/dL (ref <5)

## 2016-04-26 NOTE — ED Notes (Signed)
Pt says he has the first stages of Alzheimer's, his wife reported he had threatened her tonight, pt was brought here by police voluntarily at this time. Pt says he did not threaten his wife. He is calm & appropriate in triage. Denies SI or HI in triage.

## 2016-04-27 DIAGNOSIS — F0391 Unspecified dementia with behavioral disturbance: Secondary | ICD-10-CM

## 2016-04-27 NOTE — ED Notes (Signed)
Pt has been wanded and changed into scrubs

## 2016-04-27 NOTE — [facility] Suicide Risk Assessment (Cosign Needed)
Suicide Risk Assessment  Discharge Assessment   [facility] Discharge Suicide Risk Assessment   Principal Problem: Dementia with behavioral disturbance Discharge Diagnoses:  Patient Active Problem List   Diagnosis Date Noted  . Suicidal ideation [R45.851]   . Dementia with behavioral disturbance [F03.91] 09/01/2015  . Dementia [F03.90]   . Major depressive disorder without psychotic features ([place]) [F32.9] 05/24/2015  . Aggressive behavior [F60.89]   . Chest pain [R07.9] 09/15/2013  . Coronary atherosclerosis of native coronary artery [I25.10] 09/15/2013  . Hypokalemia [E87.6] 08/28/2013  . Acute respiratory failure ([place]) [J96.00] 08/25/2013  . Altered mental status [R41.82] 08/25/2013  . Anoxic brain injury ([place]) [G93.1] 08/25/2013  . HTN (hypertension) [I10] 08/25/2013  . Suicide attempt ([place]) [T14.91] 08/25/2013  . Vitamin B 12 deficiency [E53.8] 07/15/2013  . Unspecified hereditary and idiopathic peripheral neuropathy [G60.9] 07/15/2013  . Memory loss [R41.3] 07/15/2013    Total Time spent with patient: 30 minutes  Musculoskeletal: Strength & Muscle Tone: within normal limits Gait & Station: normal Patient leans: N/A  Psychiatric Specialty Exam:   Blood pressure 158/78, pulse 58, temperature 98.4 F (36.9 C), temperature source Oral, resp. rate 16, height 6\' 1"  (1.854 m), weight 73.483 kg (162 lb), SpO2 97 %.Body mass index is 21.38 kg/(m^2).  General Appearance: Casual  Eye Contact::  Good  Speech:  Clear and [id number]  Volume:  Normal  Mood:  Depressed  Affect:  Appropriate  Thought Process:  Goal Directed and Intact  Orientation:  Full (Time, Place, and Person)  Thought Content:  WDL  Suicidal Thoughts:  No  Homicidal Thoughts:  No  Memory:  Immediate;   Fair Recent;   Good Remote;   Good  Judgement:  Fair  Insight:  Present  Psychomotor Activity:  Normal  Concentration:  Good  Recall:  Cash of Knowledge:Good  Language: Good  Akathisia:  No  Handed:   Right  AIMS (if indicated):     Assets:  Communication Skills Desire for Improvement Financial Resources/Insurance Housing Intimacy Leisure Time Physical Health Resilience Social Support  Sleep:     Cognition: WNL  ADL's:  Intact   Mental Status Per Nursing Assessment::   On Admission:     Demographic Factors:  Male, Age 73 or older and Caucasian  Loss Factors: Decline in physical health  Historical Factors: Prior suicide attempts  Risk Reduction Factors:   Sense of responsibility to family, Religious beliefs about death and Living with another person, especially a relative  Continued Clinical Symptoms:  Medical Diagnoses and Treatments/Surgeries  Cognitive Features That Contribute To Risk:  None    Suicide Risk:  Minimal: No identifiable suicidal ideation.  Patients presenting with no risk factors but with morbid ruminations; may be classified as minimal risk based on the severity of the depressive symptoms    Plan Of Care/Follow-up recommendations:  Patient to discharge home with wife and follow up with his regular PCP as scheduled.   Elmarie Shiley, NP 04/27/2016, 11:46 AM

## 2016-04-27 NOTE — ED Notes (Signed)
Pt's wife arrived to take patient home. David from TTS speaking with patient and wife.

## 2016-04-27 NOTE — ED Provider Notes (Signed)
CSN: [id number]     Arrival date & time 04/26/16  2109 History   First MD Initiated Contact with Patient 04/27/16 0016     Chief Complaint  Patient presents with  . Aggressive Behavior     (Consider location/radiation/quality/duration/timing/severity/associated sxs/prior Treatment) HPI Comments: Patient brought in by [employer] who were called by patient's wife because she felt threatened by patient. He states he has been diagnosed with early stages of Alzheimer's. Patient denies any aggressive or threatening behavior, being angry at his wife or physically abusing his wife. No SI/HI, hallucination. No substance abuse.   The history is provided by the patient. No language interpreter was used.    Past Medical History  Diagnosis Date  . Iron deficiency anemia   . Tubular adenoma of colon 2012  . Gastropathy 2012    reactive  . PUD (peptic ulcer disease)   . Prostate cancer ([place])     a. 09/2008 s/p prostatectomy.  . CAD (coronary artery disease)     a. reported h/o MI in the 76's;  b. 04/2000 Cath: LM nl, LAD 40p, D1 small, nl, RI nl, LCX nl, RCA nl.  . Hyperlipidemia   . Vertebral artery stenosis     a. 09/2010 s/p L vertebral stenting 09/2010.  Marland Kitchen GERD (gastroesophageal reflux disease)   . Hiatal hernia   . Recurrent spontaneous pneumothorax     a. s/p L lobectomy in 1966.  Marland Kitchen DJD (degenerative joint disease)   . Hypertension   . Upper GI bleed     a. 2012  . Fatty liver   . Hypertension   . Syncope     a. in setting of GIB in 2012, presumed to be orthostatic.  . Suicide attempt ([place])     a. 08/2013 attempt by hanging with subsequent resp failure  . Myocardial infarction ([place])     " BACK IN THE 90'S"  . Dementia   . Shortness of breath    Past Surgical History  Procedure Laterality Date  . Prostatectomy    . Vertebral artery stent    . Lung removal, partial  1960s    left  . Cataract extraction Right   . Cardiac surgery    . Cardiac catheterization  09/15/2013  . Left  heart catheterization with coronary angiogram N/A 09/15/2013    Procedure: LEFT HEART CATHETERIZATION WITH CORONARY ANGIOGRAM;  Surgeon: Burnell Blanks, MD;  Location: [facility] CATH LAB;  Service: Cardiovascular;  Laterality: N/A;   Family History  Problem Relation Age of Onset  . Heart attack Father   . Alzheimer's disease Mother   . Prostate cancer Brother    Social History  Substance Use Topics  . Smoking status: Former Smoker -- 1.00 packs/day for 40 years    Types: Cigarettes    Quit date: 08/27/2002  . Smokeless tobacco: Never Used  . Alcohol Use: No     Comment: Hx heavy EtOH use but quit 2011    Review of Systems  Constitutional: Negative for fever and chills.  HENT: Negative.   Respiratory: Negative.   Cardiovascular: Negative.   Gastrointestinal: Negative.   Musculoskeletal: Negative.   Skin: Negative.   Neurological: Negative.   Psychiatric/Behavioral: Negative.       Allergies  Review of patient's allergies indicates no known allergies.  Home Medications   Prior to Admission medications   Medication Sig Start Date End Date Taking? Authorizing Provider  aspirin 81 MG tablet Take 81 mg by mouth daily.   Yes Historical  Provider, MD  chlorproMAZINE (THORAZINE) 50 MG tablet Take 50-100 mg by mouth as directed. Take 100mg s in the morning 50mg s in the afternoon and 100mg s at bedtime 01/22/16  Yes Historical Provider, MD  lamoTRIgine (LAMICTAL) 100 MG tablet Take 100 mg by mouth 2 (two) times daily. 10/04/15  Yes Historical Provider, MD  mirtazapine (REMERON) 30 MG tablet Take 30 mg by mouth at bedtime.  05/30/15  Yes Historical Provider, MD  risperiDONE (RISPERDAL) 0.5 MG tablet Take 1 tablet (0.5 mg total) by mouth at bedtime. 06/13/15  Yes Delfin Gant, NP  sertraline (ZOLOFT) 25 MG tablet Take 25 mg by mouth 3 (three) times daily.  01/02/16  Yes Historical Provider, MD   BP 161/73 mmHg  Pulse 61  Temp(Src) 98.1 F (36.7 C) (Oral)  Resp 16  Ht 6\' 1"  (1.854  m)  Wt 73.483 kg  BMI 21.38 kg/m2  SpO2 95% Physical Exam  Constitutional: He is oriented to person, place, and time. He appears well-developed and well-nourished.  HENT:  Head: Normocephalic.  Neck: Normal range of motion. Neck supple.  Cardiovascular: Normal rate and regular rhythm.   Pulmonary/Chest: Effort normal and breath sounds normal.  Abdominal: Soft. Bowel sounds are normal. There is no tenderness. There is no rebound and no guarding.  Musculoskeletal: Normal range of motion.  Neurological: He is alert and oriented to person, place, and time.  Skin: Skin is warm and dry. No rash noted.  Psychiatric: He has a normal mood and affect.    ED Course  Procedures (including critical care time) Labs Review Labs Reviewed  COMPREHENSIVE METABOLIC PANEL - Abnormal; Notable for the following:    Glucose, Bld 108 (*)    ALT 14 (*)    All other components within normal limits  ACETAMINOPHEN LEVEL - Abnormal; Notable for the following:    Acetaminophen (Tylenol), Serum <10 (*)    All other components within normal limits  ETHANOL  SALICYLATE LEVEL  CBC  URINE RAPID DRUG SCREEN, HOSP PERFORMED   Results for orders placed or performed during the hospital encounter of 04/26/16  Comprehensive metabolic panel  Result Value Ref Range   Sodium 141 135 - 145 mmol/L   Potassium 4.2 3.5 - 5.1 mmol/L   Chloride 105 101 - 111 mmol/L   CO2 27 22 - 32 mmol/L   Glucose, Bld 108 (H) 65 - 99 mg/dL   BUN 14 6 - 20 mg/dL   Creatinine, Ser 1.17 0.61 - 1.24 mg/dL   Calcium 9.4 8.9 - 10.3 mg/dL   Total Protein 7.1 6.5 - 8.1 g/dL   Albumin 4.2 3.5 - 5.0 g/dL   AST 20 15 - 41 U/L   ALT 14 (L) 17 - 63 U/L   Alkaline Phosphatase 62 38 - 126 U/L   Total Bilirubin 0.4 0.3 - 1.2 mg/dL   GFR calc non Af Amer >60 >60 mL/min   GFR calc Af Amer >60 >60 mL/min   Anion gap 9 5 - 15  Ethanol  Result Value Ref Range   Alcohol, Ethyl (B) <5 <5 mg/dL  Salicylate level  Result Value Ref Range    Salicylate Lvl 123456 2.8 - 30.0 mg/dL  Acetaminophen level  Result Value Ref Range   Acetaminophen (Tylenol), Serum <10 (L) 10 - 30 ug/mL  cbc  Result Value Ref Range   WBC 5.6 4.0 - 10.5 K/uL   RBC 4.92 4.22 - 5.81 MIL/uL   Hemoglobin 13.9 13.0 - 17.0 g/dL   HCT 41.0  39.0 - 52.0 %   MCV 83.3 78.0 - 100.0 fL   MCH 28.3 26.0 - 34.0 pg   MCHC 33.9 30.0 - 36.0 g/dL   RDW 15.0 11.5 - 15.5 %   Platelets 216 150 - 400 K/uL  Rapid urine drug screen (hospital performed)  Result Value Ref Range   Opiates NONE DETECTED NONE DETECTED   Cocaine NONE DETECTED NONE DETECTED   Benzodiazepines NONE DETECTED NONE DETECTED   Amphetamines NONE DETECTED NONE DETECTED   Tetrahydrocannabinol NONE DETECTED NONE DETECTED   Barbiturates NONE DETECTED NONE DETECTED    Imaging Review No results found. I have personally reviewed and evaluated these images and lab results as part of my medical decision-making.   EKG Interpretation None      MDM   Final diagnoses:  None    1. Normal exam 2. History of alzheimers  Patient calm, cooperative, oriented on initial evaluation. TTS requested to assess patient and collect collateral information to determine safety for discharge.   Per TTS note, patient will need a social work consult to facilitate disposition.    Charlann Lange, PA-C [id number]  Delora Fuel, MD [phone]

## 2016-04-27 NOTE — BH Assessment (Signed)
[facility] Assessment Progress Note  04/27/16: Patient will be discharged this date pending collateral from wife to provide transportation.

## 2016-04-27 NOTE — BHH Counselor (Signed)
Assessment completed. Consulted Darlyne Russian, PA-C who recommended a social work consult. Informed Dr. Roxanne Mins of the recommendation.

## 2016-04-27 NOTE — Discharge Instructions (Addendum)
Follow-up with Primary Care Provider on 5/31 or sooner if needed.      Dementia Dementia is a general term for problems with brain function. A person with dementia has memory loss and a hard time with at least one other brain function such as thinking, speaking, or problem solving. Dementia can affect social functioning, how you do your job, your mood, or your personality. The changes may be hidden for a long time. The earliest forms of this disease are usually not detected by family or friends. Dementia can be:  Irreversible.  Potentially reversible.  Partially reversible.  Progressive. This means it can get worse over time. CAUSES  Irreversible dementia causes may include:  Degeneration of brain cells (Alzheimer disease or Lewy body dementia).  Multiple small strokes (vascular dementia).  Infection (chronic meningitis or Creutzfeldt-Jakob disease).  Frontotemporal dementia. This affects younger people, age 52 to 22, compared to those who have Alzheimer disease.  Dementia associated with other disorders like Parkinson disease, Huntington disease, or HIV-associated dementia. Potentially or partially reversible dementia causes may include:  Medicines.  Metabolic causes such as excessive alcohol intake, vitamin B12 deficiency, or thyroid disease.  Masses or pressure in the brain such as a tumor, blood clot, or hydrocephalus. SIGNS AND SYMPTOMS  Symptoms are often hard to detect. Family members or coworkers may not notice them early in the disease process. Different people with dementia may have different symptoms. Symptoms can include:  A hard time with memory, especially recent memory. Long-term memory may not be impaired.  Asking the same question multiple times or forgetting something someone just said.  A hard time speaking your thoughts or finding certain words.  A hard time solving problems or performing familiar tasks (such as how to use a telephone).  Sudden  changes in mood.  Changes in personality, especially increasing moodiness or mistrust.  Depression.  A hard time understanding complex ideas that were never a problem in the past. DIAGNOSIS  There are no specific tests for dementia.   Your health care provider may recommend a thorough evaluation. This is because some forms of dementia can be reversible. The evaluation will likely include a physical exam and getting a detailed history from you and a family member. The history often gives the best clues and suggestions for a diagnosis.  Memory testing may be done. A detailed brain function evaluation called neuropsychologic testing may be helpful.  Lab tests and brain imaging (such as a CT scan or MRI scan) are sometimes important.  Sometimes observation and re-evaluation over time is very helpful. TREATMENT  Treatment depends on the cause.   If the problem is a vitamin deficiency, it may be helped or cured with supplements.  For dementias such as Alzheimer disease, medicines are available to stabilize or slow the course of the disease. There are no cures for this type of dementia.  Your health care provider can help direct you to groups, organizations, and other health care providers to help with decisions in the care of you or your loved one. HOME CARE INSTRUCTIONS The care of individuals with dementia is varied and dependent upon the progression of the dementia. The following suggestions are intended for the person living with, or caring for, the person with dementia.  Create a safe environment.  Remove the locks on bathroom doors to prevent the person from accidentally locking himself or herself in.  Use childproof latches on kitchen cabinets and any place where cleaning supplies, chemicals, or alcohol are kept.  Use childproof covers in unused electrical outlets.  Install childproof devices to keep doors and windows secured.  Remove stove knobs or install safety knobs and an  automatic shut-off on the stove.  Lower the temperature on water heaters.  Label medicines and keep them locked up.  Secure knives, lighters, matches, power tools, and guns, and keep these items out of reach.  Keep the house free from clutter. Remove rugs or anything that might contribute to a fall.  Remove objects that might break and hurt the person.  Make sure lighting is good, both inside and outside.  Install grab rails as needed.  Use a monitoring device to alert you to falls or other needs for help.  Reduce confusion.  Keep familiar objects and people around.  Use night lights or dim lights at night.  Label items or areas.  Use reminders, notes, or directions for daily activities or tasks.  Keep a simple, consistent routine for waking, meals, bathing, dressing, and bedtime.  Create a calm, quiet environment.  Place large clocks and calendars prominently.  Display emergency numbers and home address near all telephones.  Use cues to establish different times of the day. An example is to open curtains to let the natural light in during the day.   Use effective communication.  Choose simple words and short sentences.  Use a gentle, calm tone of voice.  Be careful not to interrupt.  If the person is struggling to find a word or communicate a thought, try to provide the word or thought.  Ask one question at a time. Allow the person ample time to answer questions. Repeat the question again if the person does not respond.  Reduce nighttime restlessness.  Provide a comfortable bed.  Have a consistent nighttime routine.  Ensure a regular walking or physical activity schedule. Involve the person in daily activities as much as possible.  Limit napping during the day.  Limit caffeine.  Attend social events that stimulate rather than overwhelm the senses.  Encourage good nutrition and hydration.  Reduce distractions during meal times and snacks.  Avoid  foods that are too hot or too cold.  Monitor chewing and swallowing ability.  Continue with routine vision, hearing, dental, and medical screenings.  Give medicines only as directed by the health care provider.  Monitor driving abilities. Do not allow the person to drive when safe driving is no longer possible.  Register with an identification program which could provide location assistance in the event of a missing person situation. SEEK MEDICAL CARE IF:   New behavioral problems start such as moodiness, aggressiveness, or seeing things that are not there (hallucinations).  Any new problem with brain function happens. This includes problems with balance, speech, or falling a lot.  Problems with swallowing develop.  Any symptoms of other illness happen. Small changes or worsening in any aspect of brain function can be a sign that the illness is getting worse. It can also be a sign of another medical illness such as infection. Seeing a health care provider right away is important. SEEK IMMEDIATE MEDICAL CARE IF:   A fever develops.  New or worsened confusion develops.  New or worsened sleepiness develops.  Staying awake becomes hard to do.   This information is not intended to replace advice given to you by your health care provider. Make sure you discuss any questions you have with your health care provider.   Document Released: 05/28/2001 Document Revised: 12/23/2014 Document Reviewed: 04/29/2011 Elsevier Interactive  Patient Education 2016 [employer].

## 2016-04-27 NOTE — Consult Note (Addendum)
[facility] Face-to-Face Psychiatry Consult   Reason for Consult:  Dementia and conflict with his wife and questionable suicide ideation.  Referring Physician: EDP Patient Identification: Anthony Skinner MRN:  [id number] Principal Diagnosis: Dementia with behavioral disturbance Diagnosis:   Patient Active Problem List   Diagnosis Date Noted  . Suicidal ideation [R45.851]   . Dementia with behavioral disturbance [F03.91] 09/01/2015  . Dementia [F03.90]   . Major depressive disorder without psychotic features ([place]) [F32.9] 05/24/2015  . Aggressive behavior [F60.89]   . Chest pain [R07.9] 09/15/2013  . Coronary atherosclerosis of native coronary artery [I25.10] 09/15/2013  . Hypokalemia [E87.6] 08/28/2013  . Acute respiratory failure ([place]) [J96.00] 08/25/2013  . Altered mental status [R41.82] 08/25/2013  . Anoxic brain injury ([place]) [G93.1] 08/25/2013  . HTN (hypertension) [I10] 08/25/2013  . Suicide attempt ([place]) [T14.91] 08/25/2013  . Vitamin B 12 deficiency [E53.8] 07/15/2013  . Unspecified hereditary and idiopathic peripheral neuropathy [G60.9] 07/15/2013  . Memory loss [R41.3] 07/15/2013    Total Time spent with patient: 30 minutes  Subjective:   Anthony Skinner is a 75 y.o. male patient admitted with an argument with his wife.    HPI:    Anthony Skinner is an 75 y.o. male presenting to [facility] after a misunderstanding with wife. The patient states "I had a wife misunderstanding with my wife. I guess she thought I was going to hurt myself. I asked her not to call the police. I did not threaten herself. I do not want to hurt myself. I am right with the lord now. I did try to hurt myself years ago but not recently. They say I have early Alzheimer's but I function good. My mother died from that. I want to go home. I think it should be all clear with my wife if you want to call her." Anthony Skinner was calm and pleasant during the interview. Patient was alert and oriented. Patient denies any symptoms of  depression. He appears a bit anxious over having been diagnosed with Alzheimer's as he stated "At the end my mother did not even know who I was." Patient talked about looking int an assisted living facility if he begins to have more problems with his memory. Patient was able to complete a three word recall after one minute. Patient is denying any active suicidal or homicidal ideation.   Past Psychiatric History: MDD  Risk to Self: Suicidal Ideation: No Suicidal Intent: No Is patient at risk for suicide?: No Suicidal Plan?: No Access to Means: No What has been your use of drugs/alcohol within the last 12 months?: No alcohol or drug use reported.  How many times?: 1 Other Self Harm Risks: Denies  Triggers for Past Attempts: Unknown Intentional Self Injurious Behavior: None Risk to Others: Homicidal Ideation: No Thoughts of Harm to Others: No Current Homicidal Intent: No Current Homicidal Plan: No Access to Homicidal Means: No Identified Victim: Denies History of harm to others?: No Assessment of Violence: None Noted Violent Behavior Description: No violent behaviors observed. Pt is calm and cooperative at this time.   Does patient have access to weapons?: No Criminal Charges Pending?: No Does patient have a court date: No Prior Inpatient Therapy: Prior Inpatient Therapy: Yes Prior Therapy Dates: 2014, 2016, 2014 Prior Therapy Facilty/Provider(s): [facility]  Reason for Treatment: Depression Prior Outpatient Therapy: Prior Outpatient Therapy: Yes Prior Therapy Dates: 2016- Present Prior Therapy Facilty/Provider(s): Dr. Zeb Comfort Reason for Treatment: Dementia Does patient have an ACCT team?: No Does patient have Intensive  In-House Services?  : No Does patient have [facility] services? : No Does patient have P4CC services?: No  Past Medical History:  Past Medical History  Diagnosis Date  . Iron deficiency anemia   . Tubular adenoma of colon 2012  . Gastropathy 2012     reactive  . PUD (peptic ulcer disease)   . Prostate cancer ([place])     a. 09/2008 s/p prostatectomy.  . CAD (coronary artery disease)     a. reported h/o MI in the 62's;  b. 04/2000 Cath: LM nl, LAD 40p, D1 small, nl, RI nl, LCX nl, RCA nl.  . Hyperlipidemia   . Vertebral artery stenosis     a. 09/2010 s/p L vertebral stenting 09/2010.  Marland Kitchen GERD (gastroesophageal reflux disease)   . Hiatal hernia   . Recurrent spontaneous pneumothorax     a. s/p L lobectomy in 1966.  Marland Kitchen DJD (degenerative joint disease)   . Hypertension   . Upper GI bleed     a. 2012  . Fatty liver   . Hypertension   . Syncope     a. in setting of GIB in 2012, presumed to be orthostatic.  . Suicide attempt ([place])     a. 08/2013 attempt by hanging with subsequent resp failure  . Myocardial infarction ([place])     " BACK IN THE 90'S"  . Dementia   . Shortness of breath     Past Surgical History  Procedure Laterality Date  . Prostatectomy    . Vertebral artery stent    . Lung removal, partial  1960s    left  . Cataract extraction Right   . Cardiac surgery    . Cardiac catheterization  09/15/2013  . Left heart catheterization with coronary angiogram N/A 09/15/2013    Procedure: LEFT HEART CATHETERIZATION WITH CORONARY ANGIOGRAM;  Surgeon: Burnell Blanks, MD;  Location: [facility] CATH LAB;  Service: Cardiovascular;  Laterality: N/A;   Family History:  Family History  Problem Relation Age of Onset  . Heart attack Father   . Alzheimer's disease Mother   . Prostate cancer Brother    Family Psychiatric  History: Alcohol abuse  Social History:  History  Alcohol Use No    Comment: Hx heavy EtOH use but quit 2011     History  Drug Use No    Social History   Social History  . Marital Status: Married    Spouse Name: Terri Piedra  . Number of Children: 0  . Years of Education: 12th   Occupational History  . retired    Social History Main Topics  . Smoking status: Former Smoker -- 1.00 packs/day for 40 years     Types: Cigarettes    Quit date: 08/27/2002  . Smokeless tobacco: Never Used  . Alcohol Use: No     Comment: Hx heavy EtOH use but quit 2011  . Drug Use: No  . Sexual Activity: Not Asked   Other Topics Concern  . None   Social History Narrative   Lives in [place] with wife.  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   Caffeine Use: 4 cups daily       Additional Social History:    Allergies:  No Known Allergies  Labs:  Results for orders placed or performed during the hospital encounter of 04/26/16 (from the past 48 hour(s))  Comprehensive metabolic panel     Status: Abnormal   Collection Time: 04/26/16 10:58 PM  Result Value Ref Range   Sodium 141 135 -  145 mmol/L   Potassium 4.2 3.5 - 5.1 mmol/L   Chloride 105 101 - 111 mmol/L   CO2 27 22 - 32 mmol/L   Glucose, Bld 108 (H) 65 - 99 mg/dL   BUN 14 6 - 20 mg/dL   Creatinine, Ser 1.17 0.61 - 1.24 mg/dL   Calcium 9.4 8.9 - 10.3 mg/dL   Total Protein 7.1 6.5 - 8.1 g/dL   Albumin 4.2 3.5 - 5.0 g/dL   AST 20 15 - 41 U/L   ALT 14 (L) 17 - 63 U/L   Alkaline Phosphatase 62 38 - 126 U/L   Total Bilirubin 0.4 0.3 - 1.2 mg/dL   GFR calc non Af Amer >60 >60 mL/min   GFR calc Af Amer >60 >60 mL/min    Comment: (NOTE) The eGFR has been calculated using the CKD EPI equation. This calculation has not been validated in all clinical situations. eGFR's persistently <60 mL/min signify possible Chronic Kidney Disease.    Anion gap 9 5 - 15  Ethanol     Status: None   Collection Time: 04/26/16 10:58 PM  Result Value Ref Range   Alcohol, Ethyl (B) <5 <5 mg/dL    Comment:        LOWEST DETECTABLE LIMIT FOR SERUM ALCOHOL IS 5 mg/dL FOR MEDICAL PURPOSES ONLY   Salicylate level     Status: None   Collection Time: 04/26/16 10:58 PM  Result Value Ref Range   Salicylate Lvl <5.0 2.8 - 30.0 mg/dL  Acetaminophen level     Status: Abnormal   Collection Time: 04/26/16 10:58 PM  Result Value Ref Range   Acetaminophen (Tylenol),  Serum <10 (L) 10 - 30 ug/mL    Comment:        THERAPEUTIC CONCENTRATIONS VARY SIGNIFICANTLY. A RANGE OF 10-30 ug/mL MAY BE AN EFFECTIVE CONCENTRATION FOR MANY PATIENTS. HOWEVER, SOME ARE BEST TREATED AT CONCENTRATIONS OUTSIDE THIS RANGE. ACETAMINOPHEN CONCENTRATIONS >150 ug/mL AT 4 HOURS AFTER INGESTION AND >50 ug/mL AT 12 HOURS AFTER INGESTION ARE OFTEN ASSOCIATED WITH TOXIC REACTIONS.   cbc     Status: None   Collection Time: 04/26/16 10:58 PM  Result Value Ref Range   WBC 5.6 4.0 - 10.5 K/uL   RBC 4.92 4.22 - 5.81 MIL/uL   Hemoglobin 13.9 13.0 - 17.0 g/dL   HCT 41.0 39.0 - 52.0 %   MCV 83.3 78.0 - 100.0 fL   MCH 28.3 26.0 - 34.0 pg   MCHC 33.9 30.0 - 36.0 g/dL   RDW 15.0 11.5 - 15.5 %   Platelets 216 150 - 400 K/uL  Rapid urine drug screen (hospital performed)     Status: None   Collection Time: 04/26/16 11:04 PM  Result Value Ref Range   Opiates NONE DETECTED NONE DETECTED   Cocaine NONE DETECTED NONE DETECTED   Benzodiazepines NONE DETECTED NONE DETECTED   Amphetamines NONE DETECTED NONE DETECTED   Tetrahydrocannabinol NONE DETECTED NONE DETECTED   Barbiturates NONE DETECTED NONE DETECTED    Comment:        DRUG SCREEN FOR MEDICAL PURPOSES ONLY.  IF CONFIRMATION IS NEEDED FOR ANY PURPOSE, NOTIFY LAB WITHIN 5 DAYS.        LOWEST DETECTABLE LIMITS FOR URINE DRUG SCREEN Drug Class       Cutoff (ng/mL) Amphetamine      1000 Barbiturate      200 Benzodiazepine   093 Tricyclics       818 Opiates  300 Cocaine          300 THC              50     No current facility-administered medications for this encounter.   Current Outpatient Prescriptions  Medication Sig Dispense Refill  . aspirin 81 MG tablet Take 81 mg by mouth daily.    . chlorproMAZINE (THORAZINE) 50 MG tablet Take 50-100 mg by mouth as directed. Take '100mg'$ s in the morning '50mg'$ s in the afternoon and '100mg'$ s at bedtime  2  . lamoTRIgine (LAMICTAL) 100 MG tablet Take 100 mg by mouth 2 (two)  times daily.  1  . mirtazapine (REMERON) 30 MG tablet Take 30 mg by mouth at bedtime.   0  . risperiDONE (RISPERDAL) 0.5 MG tablet Take 1 tablet (0.5 mg total) by mouth at bedtime. 30 tablet 0  . sertraline (ZOLOFT) 25 MG tablet Take 25 mg by mouth 3 (three) times daily.       Musculoskeletal: Strength & Muscle Tone: within normal limits Gait & Station: normal Patient leans: N/A  Psychiatric Specialty Exam: Review of Systems  Constitutional: Negative.   HENT: Negative.   Eyes: Negative.   Respiratory: Negative.   Cardiovascular: Negative.   Gastrointestinal: Negative.   Genitourinary: Negative.   Musculoskeletal: Negative.   Skin: Negative.   Neurological: Negative.   Endo/Heme/Allergies: Negative.   Psychiatric/Behavioral: Negative for depression, suicidal ideas, hallucinations, memory loss and substance abuse. The patient is not nervous/anxious and does not have insomnia.     Blood pressure 158/78, pulse 58, temperature 98.4 F (36.9 C), temperature source Oral, resp. rate 16, height '6\' 1"'$  (1.854 m), weight 73.483 kg (162 lb), SpO2 97 %.Body mass index is 21.38 kg/(m^2).  General Appearance: Casual  Eye Contact::  Good  Speech:  Clear and Coherent  Volume:  Normal  Mood:  Depressed  Affect:  Appropriate  Thought Process:  Goal Directed and Intact  Orientation:  Full (Time, Place, and Person)  Thought Content:  WDL  Suicidal Thoughts:  No  Homicidal Thoughts:  No  Memory:  Immediate;   Fair Recent;   Good Remote;   Good  Judgement:  Fair  Insight:  Present  Psychomotor Activity:  Normal  Concentration:  Good  Recall:  [place] of Knowledge:Good  Language: Good  Akathisia:  No  Handed:  Right  AIMS (if indicated):     Assets:  Communication Skills Desire for Improvement Financial Resources/Insurance Housing Intimacy Leisure Time Resilience Social Support  ADL's:  Intact  Cognition: WNL  Sleep:       Treatment Plan Summary: Discharge home with wife  to follow up with PCP as scheduled and to resume his home medication regimen. Patient to follow up with his outpatient Psychiatrist.   Disposition: No evidence of imminent risk to self or others at present.   Patient does not meet criteria for psychiatric inpatient admission. Supportive therapy provided about ongoing stressors.  Elmarie Shiley, NP 04/27/2016 12:11 PM  Patient seen face to face for this psych evaluation and case discussed with physician extender and treatment team. Developed treatment plan, reviewed the information documented and agree with the treatment plan.  Himani Corona,JANARDHAHA R. 04/29/2016 8:56 AM

## 2016-04-27 NOTE — BH Assessment (Signed)
Tele Assessment Note   Anthony Skinner is an 75 y.o. male Presenting to [facility] after a misunderstanding with wife. Pt stated "it was all a misunderstanding". "I had a misunderstanding with my wife after she told me to take my pills and go to bed". "I took them and went to bed and then she asked why I was in the bed so earlier". "She said I threaten her but I didn't'. "I don't want to hurt her nor do I want to hurt myself". Pt denies SI, HI and AVH at this time. Pt has had one suicide attempt in the past and has had several psychiatric hospitalizations. Pt reported that he is currently seeing a psychiatrist. Pt did not report any recent stressors or depressive symptoms. PT did not report any alcohol or substance abuse in the past year. PT did not report any physical, sexual or emotional abuse at this time. Pt shared a family history of alcohol abuse and shared that his mother had Alzheimer's. PT reported that he is interested in finding a facility.   Diagnosis: Early Onset Dementia  Past Medical History:  Past Medical History  Diagnosis Date  . Iron deficiency anemia   . Tubular adenoma of colon 2012  . Gastropathy 2012    reactive  . PUD (peptic ulcer disease)   . Prostate cancer ([place])     a. 09/2008 s/p prostatectomy.  . CAD (coronary artery disease)     a. reported h/o MI in the 23's;  b. 04/2000 Cath: LM nl, LAD 40p, D1 small, nl, RI nl, LCX nl, RCA nl.  . Hyperlipidemia   . Vertebral artery stenosis     a. 09/2010 s/p L vertebral stenting 09/2010.  Marland Kitchen GERD (gastroesophageal reflux disease)   . Hiatal hernia   . Recurrent spontaneous pneumothorax     a. s/p L lobectomy in 1966.  Marland Kitchen DJD (degenerative joint disease)   . Hypertension   . Upper GI bleed     a. 2012  . Fatty liver   . Hypertension   . Syncope     a. in setting of GIB in 2012, presumed to be orthostatic.  . Suicide attempt ([place])     a. 08/2013 attempt by hanging with subsequent resp failure  . Myocardial infarction ([place])      " BACK IN THE 90'S"  . Dementia   . Shortness of breath     Past Surgical History  Procedure Laterality Date  . Prostatectomy    . Vertebral artery stent    . Lung removal, partial  1960s    left  . Cataract extraction Right   . Cardiac surgery    . Cardiac catheterization  09/15/2013  . Left heart catheterization with coronary angiogram N/A 09/15/2013    Procedure: LEFT HEART CATHETERIZATION WITH CORONARY ANGIOGRAM;  Surgeon: Burnell Blanks, MD;  Location: [facility] CATH LAB;  Service: Cardiovascular;  Laterality: N/A;    Family History:  Family History  Problem Relation Age of Onset  . Heart attack Father   . Alzheimer's disease Mother   . Prostate cancer Brother     Social History:  reports that he quit smoking about 13 years ago. His smoking use included Cigarettes. He has a 40 pack-year smoking history. He has never used smokeless tobacco. He reports that he does not drink alcohol or use illicit drugs.  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  General Assessment Data Location of Assessment: [facility] TTS Assessment: In system Is this a Tele or Face-to-Face Assessment?: Face-to-Face Is this an Initial Assessment or a Re-assessment for this encounter?: Initial Assessment Marital status: Married (32 years) Living Arrangements: Spouse/significant other Can pt return to current living arrangement?: Yes Admission Status: Voluntary Is patient capable of signing voluntary admission?: Yes Referral Source: Self/Family/Friend Insurance  type: Medicare      Crisis Care Plan Living Arrangements: Spouse/significant other Name of Psychiatrist: Dr. Zeb Comfort (every three weeks) Name of Therapist: None  Education Status Is patient currently in school?: No Highest grade of school patient has completed: GED  Risk to self with the past 6 months Suicidal Ideation: No Has patient been a risk to self within the past 6 months prior to admission? : No Suicidal Intent: No Has patient had any suicidal intent within the past 6 months prior to admission? : No Is patient at risk for suicide?: No Suicidal Plan?: No Has patient had any suicidal plan within the past 6 months prior to admission? : No Access to Means: No What has been your use of drugs/alcohol within the last 12 months?: No alcohol or drug use reported.  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 : No Thoughts of Harm to Others: No Current Homicidal Intent: No Current Homicidal Plan: No Access to Homicidal Means: No Identified Victim: Denies History of harm to others?: No Assessment of Violence: None Noted Violent Behavior Description: No violent behaviors observed. Pt is calm and cooperative at this time.   Does patient have access to weapons?: No Criminal Charges Pending?: No Does patient have a court date: No Is patient on probation?:  No  Psychosis Hallucinations: None noted Delusions: None noted  Mental Status Report Appearance/Hygiene: Unremarkable Eye Contact: Good Motor Activity: Freedom of movement Speech: Logical/coherent Level of Consciousness: Alert Mood: Pleasant Affect: Appropriate to circumstance Anxiety Level: Minimal Thought Processes: Coherent, Relevant Judgement: Unimpaired Orientation: Person, Place, Time, Situation, Appropriate for developmental age Obsessive Compulsive Thoughts/Behaviors: None  Cognitive Functioning Concentration: Normal Memory: Recent Intact IQ: Average Insight: Good Impulse Control: Good Appetite: Good Weight Loss: 0 Weight Gain: 0 Sleep: No Change Total Hours of Sleep: 7 Vegetative Symptoms: None  ADLScreening [facility] Assessment Services) Patient's cognitive ability adequate to safely complete daily activities?: Yes Patient able to express need for assistance with ADLs?: Yes Independently performs ADLs?: Yes (appropriate for developmental age)  Prior Inpatient Therapy Prior Inpatient Therapy: Yes Prior Therapy Dates: 2014, 2016, 2014 Prior Therapy Facilty/Provider(s): [facility]  Reason for Treatment: Depression  Prior Outpatient Therapy Prior Outpatient Therapy: Yes  Prior Therapy Dates: 2016- Present Prior Therapy Facilty/Provider(s): Dr. Zeb Comfort Reason for Treatment: Dementia Does patient have an ACCT team?: No Does patient have Intensive In-House Services?  : No Does patient have [facility] services? : No Does patient have P4CC services?: No  ADL Screening (condition at time of admission) Patient's cognitive ability adequate to safely complete daily activities?: Yes Is the patient deaf or have difficulty hearing?: No Does the patient have difficulty seeing, even when wearing glasses/contacts?: No Does the patient have difficulty concentrating, remembering, or making decisions?: No Patient able to express need for assistance with ADLs?:  Yes Does the patient have difficulty dressing or bathing?: No Independently performs ADLs?: Yes (appropriate for developmental age)       Abuse/Neglect Assessment (Assessment to be complete while patient is alone) Physical Abuse: Denies Verbal Abuse: Denies Sexual Abuse: Denies Exploitation of patient/patient's resources: Denies Self-Neglect: Denies     Regulatory affairs officer (For Healthcare) Does patient have an advance directive?: Yes Type of Advance Directive: Living will, Healthcare Power of Attorney    Additional Information 1:1 In Past 12 Months?: No CIRT Risk: No Elopement Risk: No Does patient have medical clearance?: Yes     Disposition:  Disposition Initial Assessment Completed for this Encounter: Yes Disposition of Patient: Other dispositions (Social work consult) Other disposition(s): Other (Comment) (Social work consult )  Drisana Schweickert S 04/27/2016 1:14 AM

## 2016-04-27 NOTE — BH Assessment (Signed)
[facility] Assessment Progress Note Spoke with patient's wife to discuss patient's discharge this date. Wife will transport patient home at 13:00 hrs.

## 2016-07-06 ENCOUNTER — Emergency Department (HOSPITAL_COMMUNITY)
Admission: EM | Admit: 2016-07-06 | Discharge: 2016-07-07 | Disposition: A | Discharge status: Home / Self Care | Payer: Medicare Other | Attending: Emergency Medicine | Admitting: Emergency Medicine

## 2016-07-06 ENCOUNTER — Encounter (HOSPITAL_COMMUNITY): Payer: Self-pay | Admitting: Emergency Medicine

## 2016-07-06 DIAGNOSIS — I252 Old myocardial infarction: Secondary | ICD-10-CM | POA: Insufficient documentation

## 2016-07-06 DIAGNOSIS — I10 Essential (primary) hypertension: Secondary | ICD-10-CM | POA: Diagnosis not present

## 2016-07-06 DIAGNOSIS — E785 Hyperlipidemia, unspecified: Secondary | ICD-10-CM | POA: Diagnosis not present

## 2016-07-06 DIAGNOSIS — F919 Conduct disorder, unspecified: Secondary | ICD-10-CM | POA: Diagnosis not present

## 2016-07-06 DIAGNOSIS — Z046 Encounter for general psychiatric examination, requested by authority: Secondary | ICD-10-CM | POA: Diagnosis present

## 2016-07-06 DIAGNOSIS — I2510 Atherosclerotic heart disease of native coronary artery without angina pectoris: Secondary | ICD-10-CM | POA: Diagnosis not present

## 2016-07-06 DIAGNOSIS — Z8546 Personal history of malignant neoplasm of prostate: Secondary | ICD-10-CM | POA: Insufficient documentation

## 2016-07-06 DIAGNOSIS — Z87891 Personal history of nicotine dependence: Secondary | ICD-10-CM | POA: Diagnosis not present

## 2016-07-06 DIAGNOSIS — Z79899 Other long term (current) drug therapy: Secondary | ICD-10-CM | POA: Insufficient documentation

## 2016-07-06 DIAGNOSIS — Z7982 Long term (current) use of aspirin: Secondary | ICD-10-CM | POA: Diagnosis not present

## 2016-07-06 LAB — RAPID URINE DRUG SCREEN, HOSP PERFORMED
Amphetamines: NOT DETECTED
Barbiturates: NOT DETECTED
Benzodiazepines: NOT DETECTED
Cocaine: NOT DETECTED
Opiates: NOT DETECTED
Tetrahydrocannabinol: NOT DETECTED

## 2016-07-06 LAB — CBC WITH DIFFERENTIAL/PLATELET
Basophils Absolute: 0 10*3/uL (ref 0.0–0.1)
Basophils Relative: 0 %
Eosinophils Absolute: 0 10*3/uL (ref 0.0–0.7)
Eosinophils Relative: 1 %
HCT: 42 % (ref 39.0–52.0)
Hemoglobin: 13.8 g/dL (ref 13.0–17.0)
Lymphocytes Relative: 26 %
Lymphs Abs: 1.1 10*3/uL (ref 0.7–4.0)
MCH: 28 pg (ref 26.0–34.0)
MCHC: 32.9 g/dL (ref 30.0–36.0)
MCV: 85.2 fL (ref 78.0–100.0)
Monocytes Absolute: 0.5 10*3/uL (ref 0.1–1.0)
Monocytes Relative: 11 %
Neutro Abs: 2.7 10*3/uL (ref 1.7–7.7)
Neutrophils Relative %: 62 %
Platelets: 226 10*3/uL (ref 150–400)
RBC: 4.93 MIL/uL (ref 4.22–5.81)
RDW: 14.6 % (ref 11.5–15.5)
WBC: 4.3 10*3/uL (ref 4.0–10.5)

## 2016-07-06 LAB — COMPREHENSIVE METABOLIC PANEL
ALT: 14 U/L — ABNORMAL LOW (ref 17–63)
AST: 20 U/L (ref 15–41)
Albumin: 4.9 g/dL (ref 3.5–5.0)
Alkaline Phosphatase: 64 U/L (ref 38–126)
Anion gap: 7 (ref 5–15)
BUN: 16 mg/dL (ref 6–20)
CO2: 27 mmol/L (ref 22–32)
Calcium: 9.5 mg/dL (ref 8.9–10.3)
Chloride: 104 mmol/L (ref 101–111)
Creatinine, Ser: 1.41 mg/dL — ABNORMAL HIGH (ref 0.61–1.24)
GFR calc Af Amer: 56 mL/min — ABNORMAL LOW (ref >60)
GFR calc non Af Amer: 48 mL/min — ABNORMAL LOW (ref >60)
Glucose, Bld: 93 mg/dL (ref 65–99)
Potassium: 4.5 mmol/L (ref 3.5–5.1)
Sodium: 138 mmol/L (ref 135–145)
Total Bilirubin: 0.3 mg/dL (ref 0.3–1.2)
Total Protein: 8.1 g/dL (ref 6.5–8.1)

## 2016-07-06 LAB — SALICYLATE LEVEL: Salicylate Lvl: <4 mg/dL (ref 2.8–30.0)

## 2016-07-06 LAB — ACETAMINOPHEN LEVEL: Acetaminophen (Tylenol), Serum: <10 ug/mL — ABNORMAL LOW (ref 10–30)

## 2016-07-06 LAB — ETHANOL: Alcohol, Ethyl (B): <5 mg/dL (ref <5)

## 2016-07-06 NOTE — ED Notes (Signed)
Pt ambulated to private room for TTS consult.

## 2016-07-06 NOTE — ED Provider Notes (Signed)
CSN: [id number]     Arrival date & time 07/06/16  2137 History  By signing my name below, I, Irene Pap, attest that this documentation has been prepared under the direction and in the presence of [employer], PA-C. Electronically Signed: Irene Pap, ED Scribe. 07/06/2016. 11:35 PM.   Chief Complaint  Patient presents with  . Psychiatric Evaluation   The history is provided by the patient. No language interpreter was used.  HPI Comments: Anthony Skinner is a 75 y.o. Male with a hx of PUD, prostate cancer, CAD, HTN, suicide attempt, MI, dementia, depression and anxiety brought in voluntarily by [employer] who presents to the Emergency Department complaining of worsening anger occurring this evening. Pt was brought in after a fight with his wife; "I don't know, I guess I just lost it." He states that he did not threaten her, but wife called police. He was in bed when the police arrived. He does not believe that he has been having increasing episodes of angry outbursts. He believes that wife will let him come back home. 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 Pt denies SI and HI. Review of pt's records show that he has been seen in the ED 6 times in the past 6 months all for behavior related complaints that resulted in him being brought in by [employer]. Pt also has a hx of suicide attempt ~3 years ago by hanging.   Per [employer], pt has had recent stressors in his life including his wife being sick. He also reports that pt and wife get into arguments because pt does not want to take his medication.   Past Medical History  Diagnosis Date  . Iron deficiency anemia   . Tubular adenoma of colon 2012  . Gastropathy 2012    reactive  . PUD (peptic ulcer disease)   . Prostate cancer ([place])     a. 09/2008 s/p prostatectomy.  . CAD (coronary artery disease)     a. reported h/o MI in  the 42's;  b. 04/2000 Cath: LM nl, LAD 40p, D1 small, nl, RI nl, LCX nl, RCA nl.  . Hyperlipidemia   . Vertebral artery stenosis     a. 09/2010 s/p L vertebral stenting 09/2010.  Marland Kitchen GERD (gastroesophageal reflux disease)   . Hiatal hernia   . Recurrent spontaneous pneumothorax     a. s/p L lobectomy in 1966.  Marland Kitchen DJD (degenerative joint disease)   . Hypertension   . Upper GI bleed     a. 2012  . Fatty liver   . Hypertension   . Syncope     a. in setting of GIB in 2012, presumed to be orthostatic.  . Suicide attempt ([place])     a. 08/2013 attempt by hanging with subsequent resp failure  . Myocardial infarction ([place])     " BACK IN THE 90'S"  . Dementia   . Shortness of breath    Past Surgical History  Procedure Laterality Date  . Prostatectomy    . Vertebral artery stent    . Lung removal, partial  1960s    left  . Cataract extraction Right   . Cardiac surgery    . Cardiac catheterization  09/15/2013  . Left heart catheterization with coronary angiogram N/A 09/15/2013    Procedure: LEFT HEART CATHETERIZATION WITH CORONARY ANGIOGRAM;  Surgeon: Burnell Blanks, MD;  Location:  [facility] CATH LAB;  Service: Cardiovascular;  Laterality: N/A;   Family History  Problem Relation Age of Onset  . Heart attack Father   . Alzheimer's disease Mother   . Prostate cancer Brother    Social History  Substance Use Topics  . Smoking status: Former Smoker -- 1.00 packs/day for 40 years    Types: Cigarettes    Quit date: 08/27/2002  . Smokeless tobacco: Never Used  . Alcohol Use: No     Comment: Hx heavy EtOH use but quit 2011    Review of Systems  Psychiatric/Behavioral: Negative for suicidal ideas.  All other systems reviewed and are negative.   Allergies  Review of patient's allergies indicates no known allergies.  Home Medications   Prior to Admission medications   Medication Sig Start Date End Date Taking? Authorizing Provider  aspirin 81 MG tablet Take 81 mg by mouth daily.     Historical Provider, MD  chlorproMAZINE (THORAZINE) 50 MG tablet Take 50-100 mg by mouth as directed. Take 100mg s in the morning 50mg s in the afternoon and 100mg s at bedtime 01/22/16   Historical Provider, MD  lamoTRIgine (LAMICTAL) 100 MG tablet Take 100 mg by mouth 2 (two) times daily. 10/04/15   Historical Provider, MD  mirtazapine (REMERON) 30 MG tablet Take 30 mg by mouth at bedtime.  05/30/15   Historical Provider, MD  risperiDONE (RISPERDAL) 0.5 MG tablet Take 1 tablet (0.5 mg total) by mouth at bedtime. 06/13/15   Delfin Gant, NP  sertraline (ZOLOFT) 25 MG tablet Take 25 mg by mouth 3 (three) times daily.  01/02/16   Historical Provider, MD   BP 145/61 mmHg  Pulse 69  Temp(Src) 98.4 F (36.9 C) (Oral)  Resp 16  SpO2 100%   Physical Exam  Constitutional: He is oriented to person, place, and time. He appears well-developed and well-nourished. No distress.  Nontoxic appearing and in no distress.  HENT:  Head: Normocephalic and atraumatic.  Eyes: Conjunctivae and EOM are normal. No scleral icterus.  Neck: Normal range of motion.  Cardiovascular: Normal rate, regular rhythm and intact distal pulses.   Pulmonary/Chest: Effort normal. No respiratory distress. He has no wheezes.  Respirations even and unlabored  Musculoskeletal: Normal range of motion.  Neurological: He is alert and oriented to person, place, and time. He exhibits normal muscle tone. Coordination normal.  GCS 15. Speech is goal oriented. Patient answers questions appropriately and follows simple commands. He is ambulatory with steady gait.  Skin: Skin is warm and dry. No rash noted. He is not diaphoretic. No erythema. No pallor.  Psychiatric: He has a normal mood and affect. His behavior is normal.  Patient calm and cooperative  Nursing note and vitals reviewed.   ED Course  Procedures (including critical care time) DIAGNOSTIC STUDIES: Oxygen Saturation is 100% on RA, normal by my interpretation.     COORDINATION OF CARE: 11:33 PM-Discussed treatment plan which includes labs and TTS consult with pt at bedside and pt agreed to plan.    Labs Review Labs Reviewed  COMPREHENSIVE METABOLIC PANEL - Abnormal; Notable for the following:    Creatinine, Ser 1.41 (*)    ALT 14 (*)    GFR calc non Af Amer 48 (*)    GFR calc Af Amer 56 (*)    All other components within normal limits  ACETAMINOPHEN LEVEL - Abnormal; Notable for the following:    Acetaminophen (Tylenol), Serum <10 (*)    All other components within normal limits  ETHANOL  SALICYLATE LEVEL  URINE RAPID DRUG SCREEN, HOSP PERFORMED  CBC WITH DIFFERENTIAL/PLATELET    Imaging Review No results found.   I have personally reviewed and evaluated these images and lab results as part of my medical decision-making.   EKG Interpretation None      MDM   Final diagnoses:  Behavior disturbance    75 year old male presents to the emergency department for evaluation after an altercation with his wife. He has been seen in the emergency department in the past for similar episodes. Patient is calm and cooperative, in no distress. He is pleasant and without SI or HI. Laboratory workup reviewed which is noncontributory. TTS has evaluated the patient and do not see indication for inpatient management. Plan to discharge home to f/u with his PCP as needed. Return precautions given and patient discharged in satisfactory condition.  I personally performed the services described in this documentation, which was scribed in my presence. The recorded information has been reviewed and is accurate.     Antonietta Breach, PA-C 07/10/16 [id number]  Shanon Rosser, MD 07/13/16 2242

## 2016-07-06 NOTE — ED Notes (Signed)
Pt BIB [employer] voluntary.  Brought with note from psychiatrist that states, "This is to state that Anthony Skinner has been a client since October 20, 2013 and his last visit was on April 03, 2016.  [name] has a long standing hisstory of depression and alcoholism in the past However he did stop drinking many years ago but continues to have chronic depresseion and anxiety accompanied with outburst of anger.  Since he has been contributing to angrier outburst also placed a burden on his wife and family.  His diagnosis is now mild neurocognitive disorder most likely due to frontal dementia.  I have had longstanding conversations with wife for the past few months and knowing his situation at home it is in my professional opinion that [name] and his family benefit from him being put into a long term nursing home."  Pt denies SI/HI but states that he does have problems with anger.  Was brought in tonight because of a fight with his wife.

## 2016-07-07 NOTE — BH Assessment (Addendum)
Assessment completed. Consulted with Darlyne Russian, PA-C who states that patient does not meet inpatient criteria. He recommends that patient be discharged back to his psychiatrist with information to follow up with placement assistance.   Rosalin Hawking, LCSW Therapeutic Triage Specialist [place] 07/07/2016 12:24 AM

## 2016-07-07 NOTE — BH Assessment (Addendum)
Spoke with patient regarding discharge back to his psychiatrist. Patient contracts for safety. Provided patient with resources for the [facility] to provide resources for assistance with placement into a nursing home.

## 2016-07-07 NOTE — BH Assessment (Addendum)
Assessment Note  Anthony Skinner is an 75 y.o. male presenting to [facility] voluntarily due to a disagreement with his wife. Patient states "I lost it and I got real loud." Patient states that it was not a physical fight and "it was just an argument, not physical." Patient denies SI and self injurious behaviors. Patient denies recent attempts but states that he had one previous attempt by attempting to hang himself about three years ago that was due to hallucinations. Patient denies having hallucinations since that time. Patient denies HI and history of aggression. Patient denies access to firearms. Patient denies pending charges and upcoming court dates. Patient denies AVH and does not appear to be responding to internal stimuli. Patient denies use of drugs and alcohol and states that he previously "drank a lot" but has not had a drink in six years. Patient UDS and BAL clear at time of assessment.   Patient is alert and oriented to person, place, time, and situation. Patient is calm and cooperative. Patient is pleasant and makes good eye contact. Patient walks steadily without assistance. Patient states that he has seen a psychiatrist for four years at [place] and states that his last appointment was last month. Patient states that he is prescribed Sertraline, Lamotrigine, Asprin, Risperidone, and Chlorpromazine and states that he takes them all as prescribed. Patient has had previous inpatient admissions for depression but states that he feels stable at this time. Patient contracts for safety. Patient states that he is interested in being placed in a facility and he and his wife have started the process. Patient states that there are no open beds at the facilities that he prefers at this time.    Consulted with Darlyne Russian, PA-C who recommends discharging patient back to his psychiatrist with resources to assist with placement.    Diagnosis: Adjustment Disorder with Disturbance of Conduct  Past  Medical History:  Past Medical History:  Diagnosis Date  . CAD (coronary artery disease)    a. reported h/o MI in the 48's;  b. 04/2000 Cath: LM nl, LAD 40p, D1 small, nl, RI nl, LCX nl, RCA nl.  . Dementia   . DJD (degenerative joint disease)   . Fatty liver   . Gastropathy 2012   reactive  . GERD (gastroesophageal reflux disease)   . Hiatal hernia   . Hyperlipidemia   . Hypertension   . Hypertension   . Iron deficiency anemia   . Myocardial infarction ([place])    " BACK IN THE 90'S"  . Prostate cancer ([place])    a. 09/2008 s/p prostatectomy.  . PUD (peptic ulcer disease)   . Recurrent spontaneous pneumothorax    a. s/p L lobectomy in 1966.  Marland Kitchen Shortness of breath   . Suicide attempt ([place])    a. 08/2013 attempt by hanging with subsequent resp failure  . Syncope    a. in setting of GIB in 2012, presumed to be orthostatic.  . Tubular adenoma of colon 2012  . Upper GI bleed    a. 2012  . Vertebral artery stenosis    a. 09/2010 s/p L vertebral stenting 09/2010.    Past Surgical History:  Procedure Laterality Date  . CARDIAC CATHETERIZATION  09/15/2013  . CARDIAC SURGERY    . CATARACT EXTRACTION Right   . LEFT HEART CATHETERIZATION WITH CORONARY ANGIOGRAM N/A 09/15/2013   Procedure: LEFT HEART CATHETERIZATION WITH CORONARY ANGIOGRAM;  Surgeon: Burnell Blanks, MD;  Location: [facility] CATH LAB;  Service: Cardiovascular;  Laterality: N/A;  . LUNG REMOVAL, PARTIAL  1960s   left  . PROSTATECTOMY    . vertebral artery stent      Family History:  Family History  Problem Relation Age of Onset  . Heart attack Father   . Alzheimer's disease Mother   . Prostate cancer Brother     Social History:  reports that he quit smoking about 13 years ago. His smoking use included Cigarettes. He has a 40.00 pack-year smoking history. He has never used smokeless tobacco. He reports that he does not drink alcohol or use drugs.  Additional Social History:  Alcohol / Drug Use Pain Medications:  Denies Prescriptions: Denies Over the Counter: Denies History of alcohol / drug use?: Yes Substance #1 Name of Substance 1: Alcohol 1 - Last Use / Amount: 6 years ago  CIWA: CIWA-Ar BP: 135/61 Pulse Rate: 63 COWS:    Allergies: No Known Allergies  Home Medications:  (Not in a hospital admission)  OB/GYN Status:  No LMP for male patient.  General Assessment Data [facility] of [facility]: [facility] T[facility] Assessment: In system Is this a Tele or Face-to-Face Assessment?: Face-to-Face Is this an Initial Assessment or a Re-assessment for this encounter?: Initial Assessment Marital status: Married (51 years) Is patient pregnant?: No Pregnancy Status: No Living Arrangements: Spouse/significant other Can pt return to current living arrangement?: Yes Admission Status: Voluntary Is patient capable of signing voluntary admission?: Yes Referral Source: Self/Family/Friend     Crisis Care Plan Living Arrangements: Spouse/significant other Name of Psychiatrist: Para Skeans (4 years last appt last month [facility]) Name of Therapist: None  Education Status Is patient currently in school?: No Highest grade of school patient has completed: 12  Risk to self with the past 6 months Suicidal Ideation: No Has patient been a risk to self within the past 6 months prior to admission? : No Suicidal Intent: No Has patient had any suicidal intent within the past 6 months prior to admission? : No Is patient at risk for suicide?: No Suicidal Plan?: No Has patient had any suicidal plan within the past 6 months prior to admission? : No Access to Means: No What has been your use of drugs/alcohol within the last 12 months?: Denies Previous Attempts/Gestures: Yes How many times?: 1 (attempted to hang self three years ago) Other Self Harm Risks: Denies Triggers for Past Attempts: Hallucinations Intentional Self Injurious Behavior: None Family Suicide History: Unknown Recent stressful life event(s):   (denies) Persecutory voices/beliefs?: No Depression: No (denies) Depression Symptoms:  (denies symptoms) Substance abuse history and/or treatment for substance abuse?: No Suicide prevention information given to non-admitted patients: Not applicable  Risk to Others within the past 6 months Homicidal Ideation: No Does patient have any lifetime risk of violence toward others beyond the six months prior to admission? : No Thoughts of Harm to Others: No Current Homicidal Intent: No Current Homicidal Plan: No Access to Homicidal Means: No Identified Victim: Denies History of harm to others?: No Assessment of Violence: None Noted Violent Behavior Description: Denies Does patient have access to weapons?: No Criminal Charges Pending?: No Does patient have a court date: No Is patient on probation?: No  Psychosis Hallucinations: None noted Delusions: None noted  Mental Status Report Appearance/Hygiene: Unremarkable Eye Contact: Good Motor Activity: Freedom of movement Speech: Logical/coherent Level of Consciousness: Alert Mood: Pleasant Affect: Appropriate to circumstance Anxiety Level: None Thought Processes: Coherent, Relevant Judgement: Unimpaired Orientation: Person, Place, Time, Situation, Appropriate for developmental age Obsessive Compulsive Thoughts/Behaviors: None  Cognitive Functioning Concentration: Decreased Memory: Recent Intact, Remote Intact IQ: Average Insight: Good Impulse Control: Good Appetite: Good ("great") Sleep: No Change Total Hours of Sleep: 7 Vegetative Symptoms: None  ADLScreening [facility] Assessment Services) Patient's cognitive ability adequate to safely complete daily activities?: Yes Patient able to express need for assistance with ADLs?: Yes Independently performs ADLs?: Yes (appropriate for developmental age)  Prior Inpatient Therapy Prior Inpatient Therapy: Yes Prior Therapy Dates: 2017 Prior Therapy Facilty/Provider(s): [facility] Reason for Treatment: Depression/Dementia  Prior Outpatient Therapy Prior Outpatient Therapy: Yes Prior Therapy Dates: Present Prior Therapy Facilty/Provider(s): [facility] Reason for Treatment: Dementia Does patient have an ACCT team?: No Does patient have Intensive In-House Services?  : No Does patient have [facility] services? : No Does patient have P4CC services?: No  ADL Screening (condition at time of admission) Patient's cognitive ability adequate to safely complete daily activities?: Yes Is the patient deaf or have difficulty hearing?: No Does the patient have difficulty concentrating, remembering, or making decisions?: No Patient able to express need for assistance with ADLs?: Yes Does the patient have difficulty dressing or bathing?: No Independently performs ADLs?: Yes (appropriate for developmental age) Does the patient have difficulty walking or climbing stairs?: No Weakness of Legs: None Weakness of Arms/Hands: None  Home Assistive Devices/Equipment Home Assistive Devices/Equipment: None  Therapy Consults (therapy consults require a physician order) PT Evaluation Needed: No OT Evalulation Needed: No SLP Evaluation Needed: No Abuse/Neglect Assessment (Assessment to be complete while patient is alone) Physical Abuse: Denies Verbal Abuse: Denies Sexual Abuse: Denies Exploitation of patient/patient's resources: Denies Self-Neglect: Denies Values / Beliefs Cultural Requests During Hospitalization: None Spiritual Requests During Hospitalization: None Consults Spiritual Care Consult Needed: No Social Work Consult Needed: No Regulatory affairs officer (For Healthcare) Does patient have an advance directive?: Yes Type of Advance Directive: Press photographer Copy of advanced directive(s) in chart?: No - copy requested    Additional Information 1:1 In Past 12 Months?: No CIRT Risk: No Elopement Risk: No Does patient have medical clearance?:  No     Disposition:  Disposition Initial Assessment Completed for this Encounter: Yes Disposition of Patient: Other dispositions (discharge to psychiatrist per Darlyne Russian, PA-C) Other disposition(s): To current provider  On Site Evaluation by:   Reviewed with Physician:    Undrea Archbold 07/07/2016 6:32 AM

## 2016-07-10 ENCOUNTER — Emergency Department (HOSPITAL_COMMUNITY)
Admission: EM | Admit: 2016-07-10 | Discharge: 2016-07-10 | Disposition: A | Discharge status: Home / Self Care | Payer: Medicare Other | Attending: Emergency Medicine | Admitting: Emergency Medicine

## 2016-07-10 ENCOUNTER — Encounter (HOSPITAL_COMMUNITY): Payer: Self-pay | Admitting: Emergency Medicine

## 2016-07-10 DIAGNOSIS — Z7982 Long term (current) use of aspirin: Secondary | ICD-10-CM | POA: Insufficient documentation

## 2016-07-10 DIAGNOSIS — I252 Old myocardial infarction: Secondary | ICD-10-CM | POA: Diagnosis not present

## 2016-07-10 DIAGNOSIS — I2510 Atherosclerotic heart disease of native coronary artery without angina pectoris: Secondary | ICD-10-CM | POA: Insufficient documentation

## 2016-07-10 DIAGNOSIS — I10 Essential (primary) hypertension: Secondary | ICD-10-CM | POA: Insufficient documentation

## 2016-07-10 DIAGNOSIS — R531 Weakness: Secondary | ICD-10-CM | POA: Diagnosis present

## 2016-07-10 DIAGNOSIS — Z79899 Other long term (current) drug therapy: Secondary | ICD-10-CM | POA: Insufficient documentation

## 2016-07-10 DIAGNOSIS — Z8546 Personal history of malignant neoplasm of prostate: Secondary | ICD-10-CM | POA: Insufficient documentation

## 2016-07-10 DIAGNOSIS — Z87891 Personal history of nicotine dependence: Secondary | ICD-10-CM | POA: Diagnosis not present

## 2016-07-10 DIAGNOSIS — E785 Hyperlipidemia, unspecified: Secondary | ICD-10-CM | POA: Diagnosis not present

## 2016-07-10 DIAGNOSIS — R5381 Other malaise: Secondary | ICD-10-CM | POA: Diagnosis not present

## 2016-07-10 LAB — CBG MONITORING, ED: Glucose-Capillary: 131 mg/dL — ABNORMAL HIGH (ref 65–99)

## 2016-07-10 LAB — URINALYSIS, ROUTINE W REFLEX MICROSCOPIC
Bilirubin Urine: NEGATIVE
Glucose, UA: NEGATIVE mg/dL
Hgb urine dipstick: NEGATIVE
Ketones, ur: NEGATIVE mg/dL
Leukocytes, UA: NEGATIVE
Nitrite: NEGATIVE
Protein, ur: NEGATIVE mg/dL
Specific Gravity, Urine: 1.015 (ref 1.005–1.030)
pH: 5.5 (ref 5.0–8.0)

## 2016-07-10 LAB — BASIC METABOLIC PANEL
Anion gap: 6 (ref 5–15)
BUN: 15 mg/dL (ref 6–20)
CO2: 29 mmol/L (ref 22–32)
Calcium: 9 mg/dL (ref 8.9–10.3)
Chloride: 104 mmol/L (ref 101–111)
Creatinine, Ser: 1.23 mg/dL (ref 0.61–1.24)
GFR calc Af Amer: >60 mL/min (ref >60)
GFR calc non Af Amer: 56 mL/min — ABNORMAL LOW (ref >60)
Glucose, Bld: 121 mg/dL — ABNORMAL HIGH (ref 65–99)
Potassium: 4.1 mmol/L (ref 3.5–5.1)
Sodium: 139 mmol/L (ref 135–145)

## 2016-07-10 LAB — CBC
HCT: 37.8 % — ABNORMAL LOW (ref 39.0–52.0)
Hemoglobin: 12.4 g/dL — ABNORMAL LOW (ref 13.0–17.0)
MCH: 27.6 pg (ref 26.0–34.0)
MCHC: 32.8 g/dL (ref 30.0–36.0)
MCV: 84.2 fL (ref 78.0–100.0)
Platelets: 197 10*3/uL (ref 150–400)
RBC: 4.49 MIL/uL (ref 4.22–5.81)
RDW: 14.6 % (ref 11.5–15.5)
WBC: 3.3 10*3/uL — ABNORMAL LOW (ref 4.0–10.5)

## 2016-07-10 NOTE — ED Triage Notes (Signed)
Per EMS, pt from home c/o generalized weakness x1 day. Ambulatory with EMS. Denies N/V/D.

## 2016-07-10 NOTE — ED Provider Notes (Signed)
[facility] DEPT Provider Note   CSN: [id number] Arrival date & time: 07/10/16  1503  First Provider Contact:  First MD Initiated Contact with Patient 07/10/16 1855        History   Chief Complaint Chief Complaint  Patient presents with  . Weakness    HPI Anthony Skinner is a 75 y.o. male.  He states that he is here for evaluation of "a funny feeling which started this morning." He reports that his wife called the annulus because she thought he was having a TIA. He denies threatening his wife today, which he has done previously. He was in the ED, evaluated 4 days ago, and comprehensively and discharged. He denies difficulty eating, sleeping, walking or talking. He is taking his usual medications. He denies suicidal or homicidal ideation. There are no other known modifying factors.  HPI  Past Medical History:  Diagnosis Date  . CAD (coronary artery disease)    a. reported h/o MI in the 33's;  b. 04/2000 Cath: LM nl, LAD 40p, D1 small, nl, RI nl, LCX nl, RCA nl.  . Dementia   . DJD (degenerative joint disease)   . Fatty liver   . Gastropathy 2012   reactive  . GERD (gastroesophageal reflux disease)   . Hiatal hernia   . Hyperlipidemia   . Hypertension   . Hypertension   . Iron deficiency anemia   . Myocardial infarction ([place])    " BACK IN THE 90'S"  . Prostate cancer ([place])    a. 09/2008 s/p prostatectomy.  . PUD (peptic ulcer disease)   . Recurrent spontaneous pneumothorax    a. s/p L lobectomy in 1966.  Marland Kitchen Shortness of breath   . Suicide attempt ([place])    a. 08/2013 attempt by hanging with subsequent resp failure  . Syncope    a. in setting of GIB in 2012, presumed to be orthostatic.  . Tubular adenoma of colon 2012  . Upper GI bleed    a. 2012  . Vertebral artery stenosis    a. 09/2010 s/p L vertebral stenting 09/2010.    Patient Active Problem List   Diagnosis Date Noted  . Suicidal ideation   . Dementia with behavioral disturbance 09/01/2015  . Dementia     . Major depressive disorder without psychotic features ([place]) 05/24/2015  . Aggressive behavior   . Chest pain 09/15/2013  . Coronary atherosclerosis of native coronary artery 09/15/2013  . Hypokalemia 08/28/2013  . Acute respiratory failure ([place]) 08/25/2013  . Altered mental status 08/25/2013  . Anoxic brain injury ([place]) 08/25/2013  . HTN (hypertension) 08/25/2013  . Suicide attempt ([place]) 08/25/2013  . Vitamin B 12 deficiency 07/15/2013  . Unspecified hereditary and idiopathic peripheral neuropathy 07/15/2013  . Memory loss 07/15/2013    Past Surgical History:  Procedure Laterality Date  . CARDIAC CATHETERIZATION  09/15/2013  . CARDIAC SURGERY    . CATARACT EXTRACTION Right   . LEFT HEART CATHETERIZATION WITH CORONARY ANGIOGRAM N/A 09/15/2013   Procedure: LEFT HEART CATHETERIZATION WITH CORONARY ANGIOGRAM;  Surgeon: Burnell Blanks, MD;  Location: [facility] CATH LAB;  Service: Cardiovascular;  Laterality: N/A;  . LUNG REMOVAL, PARTIAL  1960s   left  . PROSTATECTOMY    . vertebral artery stent         Home Medications    Prior to Admission medications   Medication Sig Start Date End Date Taking? Authorizing Provider  aspirin EC 81 MG tablet Take 81 mg by mouth daily.   Yes  Historical Provider, MD  chlorproMAZINE (THORAZINE) 50 MG tablet Take 100 mg by mouth 2 (two) times daily.  01/22/16  Yes Historical Provider, MD  clonazePAM (KLONOPIN) 0.5 MG tablet Take 0.5 mg by mouth 2 (two) times daily. 07/04/16  Yes Historical Provider, MD  lamoTRIgine (LAMICTAL) 100 MG tablet Take 100 mg by mouth 2 (two) times daily. 10/04/15  Yes Historical Provider, MD  risperiDONE (RISPERDAL) 0.5 MG tablet Take 1 tablet (0.5 mg total) by mouth at bedtime. 06/13/15  Yes Delfin Gant, NP  sertraline (ZOLOFT) 25 MG tablet Take 25 mg by mouth 3 (three) times daily.  01/02/16  Yes Historical Provider, MD    Family History Family History  Problem Relation Age of Onset  . Alzheimer's disease Mother    . Heart attack Father   . Prostate cancer Brother     Social History Social History  Substance Use Topics  . Smoking status: Former Smoker    Packs/day: 1.00    Years: 40.00    Types: Cigarettes    Quit date: 08/27/2002  . Smokeless tobacco: Never Used  . Alcohol use No     Comment: Hx heavy EtOH use but quit 2011     Allergies   Review of patient's allergies indicates no known allergies.   Review of Systems Review of Systems  All other systems reviewed and are negative.    Physical Exam Updated Vital Signs BP 144/62 (BP Location: Left Arm)   Pulse (!) 55   Temp 98 F (36.7 C) (Oral)   Resp 16   Ht 6' (1.829 m)   Wt 164 lb (74.4 kg)   SpO2 100%   BMI 22.24 kg/m   Physical Exam  Constitutional: He appears well-developed and well-nourished.  HENT:  Head: Normocephalic and atraumatic.  Eyes: Conjunctivae are normal.  Neck: Neck supple.  Cardiovascular: Normal rate.   No murmur heard. Pulmonary/Chest: Effort normal. No respiratory distress.  Abdominal: Soft. There is no tenderness.  Musculoskeletal: He exhibits no edema.  Neurological: He is alert.  No dysarthria, aphasia or nystagmus. He walks with a normal gait.  Skin: Skin is warm and dry.  Psychiatric: He has a normal mood and affect.  Nursing note and vitals reviewed.    ED Treatments / Results  Labs (all labs ordered are listed, but only abnormal results are displayed) Labs Reviewed  BASIC METABOLIC PANEL - Abnormal; Notable for the following:       Result Value   Glucose, Bld 121 (*)    GFR calc non Af Amer 56 (*)    All other components within normal limits  CBC - Abnormal; Notable for the following:    WBC 3.3 (*)    Hemoglobin 12.4 (*)    HCT 37.8 (*)    All other components within normal limits  CBG MONITORING, ED - Abnormal; Notable for the following:    Glucose-Capillary 131 (*)    All other components within normal limits  URINALYSIS, ROUTINE W REFLEX MICROSCOPIC (NOT AT [facility])     EKG  EKG Interpretation  Date/Time:  Wednesday July 10 2016 15:24:38 EDT Ventricular Rate:  65 PR Interval:    QRS Duration: 100 QT Interval:  406 QTC Calculation: 423 R Axis:   78 Text Interpretation:  Sinus rhythm ST elevation, consider inferior injury Baseline wander in lead(s) I No significant change since last tracing Confirmed by Gerald Leitz ([id number]) on 07/10/2016 3:28:25 PM       Radiology No results found.  Procedures  Procedures (including critical care time)  Medications Ordered in ED Medications - No data to display   Initial Impression / Assessment and Plan / ED Course  I have reviewed the triage vital signs and the nursing notes.  Pertinent labs & imaging results that were available during my care of the patient were reviewed by me and considered in my medical decision making (see chart for details).  Clinical Course    Medications - No data to display  Patient Vitals for the past 24 hrs:  BP Temp Temp src Pulse Resp SpO2 Height Weight  07/10/16 1912 144/62 - - (!) 55 16 100 % - -  07/10/16 1513 136/78 98 F (36.7 C) Oral 67 16 100 % 6' (1.829 m) 164 lb (74.4 kg)  07/10/16 1510 - - - - - 97 % - -    8:00 PM Reevaluation with update and discussion. After initial assessment and treatment, an updated evaluation reveals No change in critical status. Findings discussed with the patient, and all questions were answered. Everley Evora L    Final Clinical Impressions(s) / ED Diagnoses   Final diagnoses:  None    Nonspecific malaise, patient stable today. Doubt serous bacterial infection, metabolic instability, CVA or TIA. No evidence for unstable psychiatric condition.  Nursing Notes Reviewed/ Care Coordinated Applicable Imaging Reviewed Interpretation of Laboratory Data incorporated into ED treatment  The patient appears reasonably screened and/or stabilized for discharge and I doubt any other medical condition or other [facility] requiring further  screening, evaluation, or treatment in the ED at this time prior to discharge.  Plan: Home Medications- continue; Home Treatments- rest; return here if the recommended treatment, does not improve the symptoms; Recommended follow up- PCP prn   New Prescriptions New Prescriptions   No medications on file     Daleen Bo, MD 07/10/16 2002

## 2016-08-02 ENCOUNTER — Emergency Department (HOSPITAL_COMMUNITY)
Admission: EM | Admit: 2016-08-02 | Discharge: 2016-08-02 | Disposition: A | Discharge status: Home / Self Care | Payer: Medicare Other | Attending: Emergency Medicine | Admitting: Emergency Medicine

## 2016-08-02 ENCOUNTER — Encounter (HOSPITAL_COMMUNITY): Payer: Self-pay

## 2016-08-02 DIAGNOSIS — Z87891 Personal history of nicotine dependence: Secondary | ICD-10-CM | POA: Diagnosis not present

## 2016-08-02 DIAGNOSIS — Z79899 Other long term (current) drug therapy: Secondary | ICD-10-CM | POA: Insufficient documentation

## 2016-08-02 DIAGNOSIS — R456 Violent behavior: Secondary | ICD-10-CM | POA: Diagnosis not present

## 2016-08-02 DIAGNOSIS — I10 Essential (primary) hypertension: Secondary | ICD-10-CM | POA: Insufficient documentation

## 2016-08-02 DIAGNOSIS — R410 Disorientation, unspecified: Secondary | ICD-10-CM | POA: Diagnosis present

## 2016-08-02 DIAGNOSIS — Z8546 Personal history of malignant neoplasm of prostate: Secondary | ICD-10-CM | POA: Insufficient documentation

## 2016-08-02 DIAGNOSIS — I2510 Atherosclerotic heart disease of native coronary artery without angina pectoris: Secondary | ICD-10-CM | POA: Diagnosis not present

## 2016-08-02 DIAGNOSIS — G309 Alzheimer's disease, unspecified: Secondary | ICD-10-CM | POA: Diagnosis not present

## 2016-08-02 DIAGNOSIS — Z7982 Long term (current) use of aspirin: Secondary | ICD-10-CM | POA: Diagnosis not present

## 2016-08-02 HISTORY — DX: Dementia in other diseases classified elsewhere, unspecified severity, without behavioral disturbance, psychotic disturbance, mood disturbance, and anxiety: F02.80

## 2016-08-02 HISTORY — DX: Alzheimer's disease, unspecified: G30.9

## 2016-08-02 LAB — CBC
HCT: 38.9 % — ABNORMAL LOW (ref 39.0–52.0)
Hemoglobin: 13.2 g/dL (ref 13.0–17.0)
MCH: 28.1 pg (ref 26.0–34.0)
MCHC: 33.9 g/dL (ref 30.0–36.0)
MCV: 82.8 fL (ref 78.0–100.0)
Platelets: 219 10*3/uL (ref 150–400)
RBC: 4.7 MIL/uL (ref 4.22–5.81)
RDW: 14.5 % (ref 11.5–15.5)
WBC: 4.1 10*3/uL (ref 4.0–10.5)

## 2016-08-02 LAB — RAPID URINE DRUG SCREEN, HOSP PERFORMED
Amphetamines: NOT DETECTED
Barbiturates: NOT DETECTED
Benzodiazepines: NOT DETECTED
Cocaine: NOT DETECTED
Opiates: NOT DETECTED
Tetrahydrocannabinol: NOT DETECTED

## 2016-08-02 LAB — URINALYSIS, ROUTINE W REFLEX MICROSCOPIC
Bilirubin Urine: NEGATIVE
Glucose, UA: NEGATIVE mg/dL
Ketones, ur: NEGATIVE mg/dL
Leukocytes, UA: NEGATIVE
Nitrite: NEGATIVE
Protein, ur: NEGATIVE mg/dL
Specific Gravity, Urine: 1.022 (ref 1.005–1.030)
pH: 5.5 (ref 5.0–8.0)

## 2016-08-02 LAB — COMPREHENSIVE METABOLIC PANEL
ALT: 13 U/L — ABNORMAL LOW (ref 17–63)
AST: 20 U/L (ref 15–41)
Albumin: 4.5 g/dL (ref 3.5–5.0)
Alkaline Phosphatase: 62 U/L (ref 38–126)
Anion gap: 6 (ref 5–15)
BUN: 17 mg/dL (ref 6–20)
CO2: 27 mmol/L (ref 22–32)
Calcium: 9.2 mg/dL (ref 8.9–10.3)
Chloride: 106 mmol/L (ref 101–111)
Creatinine, Ser: 1.18 mg/dL (ref 0.61–1.24)
GFR calc Af Amer: >60 mL/min (ref >60)
GFR calc non Af Amer: 59 mL/min — ABNORMAL LOW (ref >60)
Glucose, Bld: 93 mg/dL (ref 65–99)
Potassium: 3.9 mmol/L (ref 3.5–5.1)
Sodium: 139 mmol/L (ref 135–145)
Total Bilirubin: 0.5 mg/dL (ref 0.3–1.2)
Total Protein: 7.9 g/dL (ref 6.5–8.1)

## 2016-08-02 LAB — ETHANOL: Alcohol, Ethyl (B): <5 mg/dL (ref <5)

## 2016-08-02 LAB — URINE MICROSCOPIC-ADD ON
Bacteria, UA: NONE SEEN
Squamous Epithelial / LPF: NONE SEEN
WBC, UA: NONE SEEN WBC/hpf (ref 0–5)

## 2016-08-02 MED ORDER — ASPIRIN EC 81 MG PO TBEC: 81.0000 mg | DELAYED_RELEASE_TABLET | Freq: Every day | ORAL | Status: DC

## 2016-08-02 MED ORDER — LAMOTRIGINE 100 MG PO TABS
100.0000 mg | ORAL_TABLET | Freq: Two times a day (BID) | ORAL | Status: DC
  Filled 2016-08-02: qty 1

## 2016-08-02 MED ORDER — SERTRALINE HCL 50 MG PO TABS
25.0000 mg | ORAL_TABLET | Freq: Three times a day (TID) | ORAL | Status: DC
  Administered 2016-08-02: 25 mg via ORAL
  Filled 2016-08-02: qty 1

## 2016-08-02 MED ORDER — CLONAZEPAM 0.5 MG PO TABS: 0.5000 mg | ORAL_TABLET | Freq: Two times a day (BID) | ORAL | Status: DC

## 2016-08-02 MED ORDER — RISPERIDONE 0.5 MG PO TABS: 0.5000 mg | ORAL_TABLET | Freq: Every day | ORAL | Status: DC

## 2016-08-02 NOTE — BH Assessment (Signed)
Assessment Note  Anthony Skinner is an 75 y.o. male that presents this date with a history of alzheimer's. Patient's wife reported that patient had a verbal altercation earlier this date with her and seemed to be confused so wife transported patient to [facility]. Patient is pleasant on assessment and denies any S/I, H/I or AVH. Patient did admit to a verbal altercation earlier this date with his wife but stated "it was a misunderstanding." Patient is time place oriented and denies any thoughts of self harm or H/I. Patient stated him and his wife "had a little argument" earlier this date and he feels it was not associated with his [place] issues. This Probation officer contacted patient's wife Beorn Portman [phone] who stated patient had "just started yelling at her" this date but did not threaten her nor did she feel she was in danger. Per admission note: "Pt with hx of alzheimer's, CAD and demented with recent admission with medication adjustment presents with wife for violent episodes x2 today.  Per wife he is getting increasingly more confused. Pt is taking all his new meds but feels like he is taking too many. He denies wanting to hurt himself or his wife. Eating and sleeping well". Patient stated she (wife) brought patient to [facility] just to make sure he "was all right." Case was staffed with Meryl Crutch FNP who recommended patient be discharged to home. This Probation officer contacted patient's wife who agreed to pick up patient later this date.   Diagnosis: Alzheimer's and Dementia    Past Medical History:  Past Medical History:  Diagnosis Date  . Alzheimer disease   . CAD (coronary artery disease)    a. reported h/o MI in the 22's;  b. 04/2000 Cath: LM nl, LAD 40p, D1 small, nl, RI nl, LCX nl, RCA nl.  . Dementia   . DJD (degenerative joint disease)   . Fatty liver   . Gastropathy 2012   reactive  . GERD (gastroesophageal reflux disease)   . Hiatal hernia   . Hyperlipidemia   . Hypertension   . Hypertension   . Iron  deficiency anemia   . Myocardial infarction ([place])    " BACK IN THE 90'S"  . Prostate cancer ([place])    a. 09/2008 s/p prostatectomy.  . PUD (peptic ulcer disease)   . Recurrent spontaneous pneumothorax    a. s/p L lobectomy in 1966.  Marland Kitchen Shortness of breath   . Suicide attempt ([place])    a. 08/2013 attempt by hanging with subsequent resp failure  . Syncope    a. in setting of GIB in 2012, presumed to be orthostatic.  . Tubular adenoma of colon 2012  . Upper GI bleed    a. 2012  . Vertebral artery stenosis    a. 09/2010 s/p L vertebral stenting 09/2010.    Past Surgical History:  Procedure Laterality Date  . CARDIAC CATHETERIZATION  09/15/2013  . CARDIAC SURGERY    . CATARACT EXTRACTION Right   . LEFT HEART CATHETERIZATION WITH CORONARY ANGIOGRAM N/A 09/15/2013   Procedure: LEFT HEART CATHETERIZATION WITH CORONARY ANGIOGRAM;  Surgeon: Burnell Blanks, MD;  [facility] C[facility] LAB;  Service: Cardiovascular;  Laterality: N/A;  . LUNG REMOVAL, PARTIAL  1960s   left  . PROSTATECTOMY    . vertebral artery stent      Family History:  Family History  Problem Relation Age of Onset  . Alzheimer's disease Mother   . Heart attack Father   . Prostate cancer Brother  Social History:  reports that he quit smoking about 13 years ago. His smoking use included Cigarettes. He has a 40.00 pack-year smoking history. He has never used smokeless tobacco. He reports that he does not drink alcohol or use drugs.  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  General Assessment Data Location of Assessment: [facility] ED TTS Assessment: In system Is this a Tele or Face-to-Face Assessment?: Face-to-Face Is this an Initial Assessment or a Re-assessment for this encounter?: Initial Assessment Marital status: Married (wife Mikiel Bergman [phone]) Is patient pregnant?: No Pregnancy Status: No Living Arrangements: Spouse/significant other Can pt return to current living arrangement?: Yes Admission Status: Voluntary Is patient capable of signing voluntary admission?: Yes Referral Source: Self/Family/Friend Insurance type: Medicaid  Medical Screening Exam ([place]) Medical Exam completed: Yes  Crisis Care Plan Living Arrangements: Spouse/significant other Name of Psychiatrist: Bahrani MD Name of Therapist: None  Education Status Is patient currently in school?: No Highest grade of school patient has completed: 12  Risk to self with the past 6 months Suicidal Ideation: No Has patient been a risk to self within the past 6 months prior to admission? : No Suicidal Intent: No Has patient had any suicidal intent within the past 6 months prior to admission? : No Is patient at risk for suicide?: No Suicidal Plan?: No Has patient had any suicidal plan within the past 6 months prior to admission? : No Access to Means: No What has been your use of drugs/alcohol within the last 12 months?: denies Previous Attempts/Gestures: Yes How many times?: 1 Other Self Harm Risks: denies Triggers for Past Attempts: Unpredictable Intentional Self Injurious Behavior: None Family Suicide History: Unknown Recent stressful life event(s):  (relationship with wife) Persecutory voices/beliefs?: No Depression: No Depression Symptoms:  (pt denies) Substance abuse history and/or treatment for substance abuse?: No Suicide prevention information given to non-admitted patients: Not applicable  Risk to Others within the past 6 months Homicidal  Ideation: No Does patient have any lifetime risk of violence toward others beyond the six months prior to admission? : No Thoughts of Harm to Others: No Current Homicidal Intent: No Current Homicidal Plan: No Access to Homicidal Means: No Identified Victim: denies History of harm to others?: No Assessment of Violence: None Noted Violent Behavior Description: denies Does patient have access to weapons?: No Criminal Charges Pending?: No Does patient have a court date: No Is patient on probation?: No  Psychosis Hallucinations: None noted Delusions: None noted  Mental Status Report Appearance/Hygiene: Unremarkable Eye Contact: Fair Motor Activity: Freedom of movement Speech: Logical/coherent Level of Consciousness: Alert Mood: Pleasant Affect: Appropriate to circumstance Anxiety Level: None Thought Processes: Coherent, Relevant Judgement: Unimpaired Orientation: Person, Place, Time Obsessive Compulsive Thoughts/Behaviors: None  Cognitive Functioning Concentration: Normal Memory: Recent Intact, Remote Intact IQ: Average Insight: Good Impulse Control: Good Appetite: Good Weight Loss: 0 Weight Gain: 0 Sleep: No Change Total Hours of Sleep: 8 Vegetative Symptoms: None  ADLScreening [facility] Assessment Services) Patient's cognitive ability adequate to safely complete daily activities?: Yes Patient able to express need  for assistance with ADLs?: Yes Independently performs ADLs?: Yes (appropriate for developmental age)  Prior Inpatient Therapy Prior Inpatient Therapy: Yes Prior Therapy Dates: 2017 Prior Therapy Facilty/Provider(s): [facility]  Reason for Treatment: Altered mental status  Prior Outpatient Therapy Prior Outpatient Therapy: Yes Prior Therapy Dates: current Prior Therapy Facilty/Provider(s): [facility] Reason for Treatment: Dementia Does patient have an ACCT team?: No Does patient have Intensive In-House Services?  : No Does patient have [facility] services?  : No Does patient have P4CC services?: No  ADL Screening (condition at time of admission) Patient's cognitive ability adequate to safely complete daily activities?: Yes Is the patient deaf or have difficulty hearing?: No Does the patient have difficulty seeing, even when wearing glasses/contacts?: No Does the patient have difficulty concentrating, remembering, or making decisions?: No Patient able to express need for assistance with ADLs?: Yes Does the patient have difficulty dressing or bathing?: No Independently performs ADLs?: Yes (appropriate for developmental age) Does the patient have difficulty walking or climbing stairs?: No Weakness of Legs: None Weakness of Arms/Hands: None  Home Assistive Devices/Equipment Home Assistive Devices/Equipment: None  Therapy Consults (therapy consults require a physician order) PT Evaluation Needed: No OT Evalulation Needed: No SLP Evaluation Needed: No Abuse/Neglect Assessment (Assessment to be complete while patient is alone) Physical Abuse: Denies Verbal Abuse: Denies Sexual Abuse: Denies Exploitation of patient/patient's resources: Denies Self-Neglect: Denies Values / Beliefs Cultural Requests During Hospitalization: None Spiritual Requests During Hospitalization: None Consults Spiritual Care Consult Needed: No Social Work Consult Needed: No Regulatory affairs officer (For Healthcare) Does patient have an advance directive?: No Would patient like information on creating an advanced directive?: No - patient declined information Type of Advance Directive: Living will Does patient want to make changes to advanced directive?: No - Patient declined Copy of advanced directive(s) in chart?: No - copy requested    Additional Information 1:1 In Past 12 Months?: No CIRT Risk: No Elopement Risk: No Does patient have medical clearance?: No     Disposition: Case was staffed with Meryl Crutch FNP who recommended patient be discharged to home. This  Probation officer contacted patient's wife who agreed to pick up patient later this date.   Disposition Initial Assessment Completed for this Encounter: Yes Disposition of Patient: Other dispositions Other disposition(s): Other (Comment) (pt to be d/ced to home)  On Site Evaluation by:   Reviewed with Physician:    Mamie Nick 08/02/2016 7:25 PM

## 2016-08-02 NOTE — ED Triage Notes (Addendum)
PT ARRIVED FOR VIOLENT EPISODES X2 TODAY. PER THE WIFE, THE PT HAS A HX OF ALZHEIMER'S AND HE IS GETTING INCREASINGLY MORE CONFUSED, DELUSIONAL, AND VIOLENT. PT IS TAKING HIS PRESCRIBED MEDICATIONS, BUT THE MEDICATIONS ARE NOT WORKING. PER THE WIFE, THE PT WAS ADMITTED 3 WEEKS AGO FOR THE SAME BEHAVIOR. HIS PSYCH DOCTOR IS WORKING ON PLACEMENT, BUT THE WIFE DOES NOT FEEL SAFE WITH HIM AT THIS TIME. DENIES SI/HI.

## 2016-08-02 NOTE — ED Notes (Signed)
Bed: WTR5 Expected date:  Expected time:  Means of arrival:  Comments: 

## 2016-08-02 NOTE — ED Provider Notes (Addendum)
[facility] DEPT Provider Note   CSN: [id number] Arrival date & time: 08/02/16  1459     History   Chief Complaint Chief Complaint  Patient presents with  . Altered Mental Status    HPI Anthony Skinner is a 75 y.o. male.  The history is provided by the patient and the spouse.  Altered Mental Status   This is a recurrent problem.  Pt with hx of alzheimer's, CAD and demented with recent admission with medication adjustment presents with wife for violent episodes x2 today.  Per wife he is getting increasingly more confused and delusional and violent.  Pt is taking all his new meds but feels like he is taking too many.  He denies wanting to hurt himself or his wife.  Eating and sleeping well.  His psych MD is currently working on placement but wife feels she is not safe.  Pt denies being angry at this moment and has no complaints.  Past Medical History:  Diagnosis Date  . Alzheimer disease   . CAD (coronary artery disease)    a. reported h/o MI in the 42's;  b. 04/2000 Cath: LM nl, LAD 40p, D1 small, nl, RI nl, LCX nl, RCA nl.  . Dementia   . DJD (degenerative joint disease)   . Fatty liver   . Gastropathy 2012   reactive  . GERD (gastroesophageal reflux disease)   . Hiatal hernia   . Hyperlipidemia   . Hypertension   . Hypertension   . Iron deficiency anemia   . Myocardial infarction ([place])    " BACK IN THE 90'S"  . Prostate cancer ([place])    a. 09/2008 s/p prostatectomy.  . PUD (peptic ulcer disease)   . Recurrent spontaneous pneumothorax    a. s/p L lobectomy in 1966.  Marland Kitchen Shortness of breath   . Suicide attempt ([place])    a. 08/2013 attempt by hanging with subsequent resp failure  . Syncope    a. in setting of GIB in 2012, presumed to be orthostatic.  . Tubular adenoma of colon 2012  . Upper GI bleed    a. 2012  . Vertebral artery stenosis    a. 09/2010 s/p L vertebral stenting 09/2010.    Patient Active Problem List   Diagnosis Date Noted  . Suicidal ideation     . Dementia with behavioral disturbance 09/01/2015  . Dementia   . Major depressive disorder without psychotic features ([place]) 05/24/2015  . Aggressive behavior   . Chest pain 09/15/2013  . Coronary atherosclerosis of native coronary artery 09/15/2013  . Hypokalemia 08/28/2013  . Acute respiratory failure ([place]) 08/25/2013  . Altered mental status 08/25/2013  . Anoxic brain injury ([place]) 08/25/2013  . HTN (hypertension) 08/25/2013  . Suicide attempt ([place]) 08/25/2013  . Vitamin B 12 deficiency 07/15/2013  . Unspecified hereditary and idiopathic peripheral neuropathy 07/15/2013  . Memory loss 07/15/2013    Past Surgical History:  Procedure Laterality Date  . CARDIAC CATHETERIZATION  09/15/2013  . CARDIAC SURGERY    . CATARACT EXTRACTION Right   . LEFT HEART CATHETERIZATION WITH CORONARY ANGIOGRAM N/A 09/15/2013   Procedure: LEFT HEART CATHETERIZATION WITH CORONARY ANGIOGRAM;  Surgeon: Burnell Blanks, MD;  Location[facility] CATH LAB;  Service: Cardiovascular;  Laterality: N/A;  . LUNG REMOVAL, PARTIAL  1960s   left  . PROSTATECTOMY    . vertebral artery stent         Home Medications    Prior to Admission medications   Medication Sig  Start Date End Date Taking? Authorizing Provider  aspirin EC 81 MG tablet Take 81 mg by mouth daily.   Yes Historical Provider, MD  clonazePAM (KLONOPIN) 0.5 MG tablet Take 0.5 mg by mouth 2 (two) times daily. 07/04/16  Yes Historical Provider, MD  lamoTRIgine (LAMICTAL) 100 MG tablet Take 100 mg by mouth 2 (two) times daily. 10/04/15  Yes Historical Provider, MD  risperiDONE (RISPERDAL) 0.5 MG tablet Take 1 tablet (0.5 mg total) by mouth at bedtime. 06/13/15  Yes Delfin Gant, NP  sertraline (ZOLOFT) 25 MG tablet Take 25 mg by mouth 3 (three) times daily.  01/02/16  Yes Historical Provider, MD    Family History Family History  Problem Relation Age of Onset  . Alzheimer's disease Mother   . Heart attack Father   . Prostate cancer Brother      Social History Social History  Substance Use Topics  . Smoking status: Former Smoker    Packs/day: 1.00    Years: 40.00    Types: Cigarettes    Quit date: 08/27/2002  . Smokeless tobacco: Never Used  . Alcohol use No     Comment: Hx heavy EtOH use but quit 2011     Allergies   Review of patient's allergies indicates no known allergies.   Review of Systems Review of Systems  All other systems reviewed and are negative.    Physical Exam Updated Vital Signs BP 114/85 (BP Location: Right Arm)   Pulse 67   Temp 98.1 F (36.7 C) (Oral)   Resp 20   Ht 6' (1.829 m)   Wt 162 lb (73.5 kg)   SpO2 99%   BMI 21.97 kg/m   Physical Exam  Constitutional: He is oriented to person, place, and time. He appears well-developed and well-nourished. No distress.  HENT:  Head: Normocephalic and atraumatic.  Mouth/Throat: Oropharynx is clear and moist.  Eyes: Conjunctivae and EOM are normal. Pupils are equal, round, and reactive to light.  Neck: Normal range of motion. Neck supple.  Cardiovascular: Normal rate, regular rhythm and intact distal pulses.   No murmur heard. Pulmonary/Chest: Effort normal and breath sounds normal. No respiratory distress. He has no wheezes. He has no rales.  Abdominal: Soft. He exhibits no distension. There is no tenderness. There is no rebound and no guarding.  Musculoskeletal: Normal range of motion. He exhibits no edema or tenderness.  Neurological: He is alert and oriented to person, place, and time. He has normal strength. No cranial nerve deficit or sensory deficit.  Skin: Skin is warm and dry. No rash noted. No erythema.  Psychiatric: He has a normal mood and affect. His behavior is normal.  Calm and cooperative.  No SI or HI.    Nursing note and vitals reviewed.    ED Treatments / Results  Labs (all labs ordered are listed, but only abnormal results are displayed) Labs Reviewed  COMPREHENSIVE METABOLIC PANEL - Abnormal; Notable for the  following:       Result Value   ALT 13 (*)    GFR calc non Af Amer 59 (*)    All other components within normal limits  CBC - Abnormal; Notable for the following:    HCT 38.9 (*)    All other components within normal limits  URINALYSIS, ROUTINE W REFLEX MICROSCOPIC (NOT AT [facility]) - Abnormal; Notable for the following:    Hgb urine dipstick TRACE (*)    All other components within normal limits  ETHANOL  URINE RAPID DRUG  SCREEN, HOSP PERFORMED  URINE MICROSCOPIC-ADD ON    EKG  EKG Interpretation None       Radiology No results found.  Procedures Procedures (including critical care time)  Medications Ordered in ED Medications - No data to display   Initial Impression / Assessment and Plan / ED Course  I have reviewed the triage vital signs and the nursing notes.  Pertinent labs & imaging results that were available during my care of the patient were reviewed by me and considered in my medical decision making (see chart for details).  Clinical Course    Pt brought in by wife for violent episodes and concern for her own safety.  She is currently not available and pt currently is calm and cooperative.  Recent admission with medication adjustment.  Pt has no complaints.  No SI or HI.  Oriented x3.  Medically clear.  Labs pending and  TTS to see. Labs wnl.  Medically clear  7:51 PM  Psychiatry evaluated the patient and felt that he was cleared for discharge home. I agree with this assessment as he has been calm and cooperative here. He has no SI or HI. Wife is present and willing to take patient home. He will follow-up with his psychiatrist.  Final Clinical Impressions(s) / ED Diagnoses   Final diagnoses:  Violent behavior    New Prescriptions New Prescriptions   No medications on file     Blanchie Dessert, MD 08/02/16 [place], MD 08/02/16 1952

## 2016-09-20 DIAGNOSIS — R3915 Urgency of urination: Secondary | ICD-10-CM | POA: Insufficient documentation

## 2017-02-07 ENCOUNTER — Encounter (HOSPITAL_COMMUNITY): Payer: Self-pay | Admitting: *Deleted

## 2017-02-07 ENCOUNTER — Emergency Department (HOSPITAL_COMMUNITY)
Admission: EM | Admit: 2017-02-07 | Discharge: 2017-02-07 | Disposition: A | Discharge status: Home / Self Care | Payer: Medicare Other | Attending: Emergency Medicine | Admitting: Emergency Medicine

## 2017-02-07 DIAGNOSIS — I2510 Atherosclerotic heart disease of native coronary artery without angina pectoris: Secondary | ICD-10-CM | POA: Diagnosis not present

## 2017-02-07 DIAGNOSIS — Z87891 Personal history of nicotine dependence: Secondary | ICD-10-CM | POA: Diagnosis not present

## 2017-02-07 DIAGNOSIS — Z7982 Long term (current) use of aspirin: Secondary | ICD-10-CM | POA: Diagnosis not present

## 2017-02-07 DIAGNOSIS — Z8546 Personal history of malignant neoplasm of prostate: Secondary | ICD-10-CM | POA: Diagnosis not present

## 2017-02-07 DIAGNOSIS — I10 Essential (primary) hypertension: Secondary | ICD-10-CM | POA: Insufficient documentation

## 2017-02-07 DIAGNOSIS — Z79899 Other long term (current) drug therapy: Secondary | ICD-10-CM | POA: Diagnosis not present

## 2017-02-07 DIAGNOSIS — G309 Alzheimer's disease, unspecified: Secondary | ICD-10-CM | POA: Diagnosis not present

## 2017-02-07 DIAGNOSIS — R454 Irritability and anger: Secondary | ICD-10-CM | POA: Insufficient documentation

## 2017-02-07 NOTE — ED Notes (Signed)
Pt assisted to bathroom

## 2017-02-07 NOTE — ED Provider Notes (Signed)
[place] DEPT Provider Note   CSN: [id number] Arrival date & time: 02/07/17  1350     History   Chief Complaint Chief Complaint  Patient presents with  . Medical Clearance    HPI Anthony Skinner is a 76 y.o. male.  He presents for evaluation of a outburst of anger, resulting in him throwing things against the wall and arguing with his wife over a broken TV remote.  Because of this his wife called the police, who transferred him here.  The patient did not assault his wife.  The wife does not plan on taking out involuntary commitment papers.  The patient states that he is taking his medications regularly, and seeing his psychiatrist every 3 months.  His next appointment is next week.  The patient has had multiple visits here with the same scenario, and presenting complaints.  He denies recent fever chills nausea vomiting weakness or dizziness.  There are no other known modifying factors  HPI  Past Medical History:  Diagnosis Date  . Alzheimer disease   . CAD (coronary artery disease)    a. reported h/o MI in the 70's;  b. 04/2000 Cath: LM nl, LAD 40p, D1 small, nl, RI nl, LCX nl, RCA nl.  . Dementia   . DJD (degenerative joint disease)   . Fatty liver   . Gastropathy 2012   reactive  . GERD (gastroesophageal reflux disease)   . Hiatal hernia   . Hyperlipidemia   . Hypertension   . Hypertension   . Iron deficiency anemia   . Myocardial infarction    " BACK IN THE 90'S"  . Prostate cancer ([place])    a. 09/2008 s/p prostatectomy.  . PUD (peptic ulcer disease)   . Recurrent spontaneous pneumothorax    a. s/p L lobectomy in 1966.  Marland Kitchen Shortness of breath   . Suicide attempt    a. 08/2013 attempt by hanging with subsequent resp failure  . Syncope    a. in setting of GIB in 2012, presumed to be orthostatic.  . Tubular adenoma of colon 2012  . Upper GI bleed    a. 2012  . Vertebral artery stenosis    a. 09/2010 s/p L vertebral stenting 09/2010.    Patient Active Problem  List   Diagnosis Date Noted  . Suicidal ideation   . Dementia with behavioral disturbance 09/01/2015  . Dementia   . Major depressive disorder without psychotic features ([place]) 05/24/2015  . Aggressive behavior   . Chest pain 09/15/2013  . Coronary atherosclerosis of native coronary artery 09/15/2013  . Hypokalemia 08/28/2013  . Acute respiratory failure ([place]) 08/25/2013  . Altered mental status 08/25/2013  . Anoxic brain injury ([place]) 08/25/2013  . HTN (hypertension) 08/25/2013  . Suicide attempt 08/25/2013  . Vitamin B 12 deficiency 07/15/2013  . Unspecified hereditary and idiopathic peripheral neuropathy 07/15/2013  . Memory loss 07/15/2013    Past Surgical History:  Procedure Laterality Date  . CARDIAC CATHETERIZATION  09/15/2013  . CARDIAC SURGERY    . CATARACT EXTRACTION Right   . LEFT HEART CATHETERIZATION WITH CORONARY ANGIOGRAM N/A 09/15/2013   Procedure: LEFT HEART CATHETERIZATION WITH CORONARY ANGIOGRAM;  Surgeon: Burnell Blanks, MD;  Location: [facility] CATH LAB;  Service: Cardiovascular;  Laterality: N/A;  . LUNG REMOVAL, PARTIAL  1960s   left  . PROSTATECTOMY    . vertebral artery stent         Home Medications    Prior to Admission medications   Medication Sig  Start Date End Date Taking? Authorizing Provider  aspirin EC 81 MG tablet Take 81 mg by mouth daily.    Historical Provider, MD  clonazePAM (KLONOPIN) 0.5 MG tablet Take 0.5 mg by mouth 2 (two) times daily. 07/04/16   Historical Provider, MD  lamoTRIgine (LAMICTAL) 100 MG tablet Take 100 mg by mouth 2 (two) times daily. 10/04/15   Historical Provider, MD  risperiDONE (RISPERDAL) 0.5 MG tablet Take 1 tablet (0.5 mg total) by mouth at bedtime. 06/13/15   Delfin Gant, NP  sertraline (ZOLOFT) 25 MG tablet Take 25 mg by mouth 3 (three) times daily.  01/02/16   Historical Provider, MD    Family History Family History  Problem Relation Age of Onset  . Alzheimer's disease Mother   . Heart attack  Father   . Prostate cancer Brother     Social History Social History  Substance Use Topics  . Smoking status: Former Smoker    Packs/day: 1.00    Years: 40.00    Types: Cigarettes    Quit date: 08/27/2002  . Smokeless tobacco: Never Used  . Alcohol use No     Comment: Hx heavy EtOH use but quit 2011     Allergies   Patient has no known allergies.   Review of Systems Review of Systems  All other systems reviewed and are negative.    Physical Exam Updated Vital Signs BP 136/68 (BP Location: Left Arm)   Pulse 80   Temp 98 F (36.7 C) (Oral)   Resp 16   Ht 6' (1.829 m)   Wt 160 lb (72.6 kg)   SpO2 99%   BMI 21.70 kg/m   Physical Exam  Constitutional: He is oriented to person, place, and time. He appears well-developed and well-nourished.  HENT:  Head: Normocephalic and atraumatic.  Right Ear: External ear normal.  Left Ear: External ear normal.  Eyes: Conjunctivae and EOM are normal. Pupils are equal, round, and reactive to light.  Neck: Normal range of motion and phonation normal. Neck supple.  Cardiovascular: Normal rate.   Pulmonary/Chest: Effort normal. He exhibits no bony tenderness.  Musculoskeletal: Normal range of motion.  Neurological: He is alert and oriented to person, place, and time. No cranial nerve deficit or sensory deficit. He exhibits normal muscle tone. Coordination normal.  No dysarthria or aphasia or nystagmus.  Normal gait.  Skin: Skin is warm, dry and intact.  Psychiatric: He has a normal mood and affect. His behavior is normal. Judgment and thought content normal.  He is lucid and cooperative.  He is not responding to internal stimuli.  Nursing note and vitals reviewed.    ED Treatments / Results  Labs (all labs ordered are listed, but only abnormal results are displayed) Labs Reviewed - No data to display  EKG  EKG Interpretation None       Radiology No results found.  Procedures Procedures (including critical care  time)  Medications Ordered in ED Medications - No data to display   Initial Impression / Assessment and Plan / ED Course  I have reviewed the triage vital signs and the nursing notes.  Pertinent labs & imaging results that were available during my care of the patient were reviewed by me and considered in my medical decision making (see chart for details).  Clinical Course as of Feb 07 1435  Fri Feb 07, 2017  1435 The patient is well-known to me, from prior evaluations for similar complaints.  The patient is currently at his  baseline, from which he is usually discharged from the emergency department.  [EW]    Clinical Course User Index [EW] Daleen Bo, MD    Medications - No data to display  Patient Vitals for the past 24 hrs:  BP Temp Temp src Pulse Resp SpO2 Height Weight  02/07/17 1403 136/68 98 F (36.7 C) Oral 80 16 99 % - -  02/07/17 1358 - - - - - - 6' (1.829 m) 160 lb (72.6 kg)    Consultation with patient's wife, and the social worker was present-social worker was able to address some of the wife's concerns, and also will arrange for case management to evaluate the patient in his home, with attention to interaction between the patient and his wife.  Case management order entered into epic at discharge.  3:57 PM Reevaluation with update and discussion. After initial assessment and treatment, an updated evaluation reveals no change in clinical status he continues to be comfortable, and lucid.  Findings discussed with patient and wife all questions were answered. Bernadetta Roell L   Final Clinical Impressions(s) / ED Diagnoses   Final diagnoses:  Outbursts of anger    Anger outburst, recurrent problem.  He is actually been doing well from the standpoint having last been here with the same problem in August 2017.  Prior to that he had numerous visits in 2017, for similar problems.  Patient is calm down now is stable.  There is no acute psychiatric emergency at this time.   Patient has medications for his problem and close follow-up scheduled.  Nursing Notes Reviewed/ Care Coordinated Applicable Imaging Reviewed Interpretation of Laboratory Data incorporated into ED treatment  The patient appears reasonably screened and/or stabilized for discharge and I doubt any other medical condition or other [facility] requiring further screening, evaluation, or treatment in the ED at this time prior to discharge.  Plan: Home Medications-continue rest; Home Treatments-, discussed calming measures when he becomes angry; return here if the recommended treatment, does not improve the symptoms; Recommended follow up-psychiatry as scheduled, PCP as needed.  New Prescriptions New Prescriptions   No medications on file     Daleen Bo, MD 02/07/17 1558

## 2017-02-07 NOTE — Progress Notes (Signed)
Spoke to patient and wife, requesting assistance in home stating that [facility] comes into home currently to assist wife .  Verified home and cell phone numbers.  Placed call to [facility] rep, message left for rep.  5:30 pm  No response to message called [facility] line , informed that patient could be picked up for services faxed Face To Face, ED Provider notes to intake office.  6:13 pm Called  Mrs. Daino at  [phone] informed that [facility] had been notified of orders provided contact numbers to [facility].  No other assistance needed completed.  Rayburn Ma RN, BSN, CM

## 2017-02-07 NOTE — ED Notes (Signed)
Pt denies SI/HI.  RN spoke with Pts wife to update her on Pt status with Pts permission.

## 2017-02-07 NOTE — ED Triage Notes (Signed)
Patient brought to ED by [employer], pt sts him and his wife got into an argument at home.  Patient got aggressive towards wife so he admitted he needed help.  Alert and oriented x3.  Patient sts he was dx with dementia 2 years ago.

## 2017-02-07 NOTE — Care Management (Signed)
ED CM received CM consult from [place], [facility] services recommended. Patient's wife is already receiving [facility] services but does not remember the agency's name so will call back with name to arrange with same agency. Wife verbalizes understanding teach back done.  CM will follow up with wife later this evening.

## 2017-02-07 NOTE — Progress Notes (Signed)
CSW and EDP met with pt's wife and reviewed pt's medical situation with pt's wife.  EDP left to put in case mgt consult for Home Health to assist the pt in the home, once discharged.  CSW offered pt's wife resources in the community, such as the police, the magistrate and the pt's outpatient psychiatrist should she feel unsafe and offered pt's wife a written version of these resources.  Pt's wife was appreciative and thanked the CSW.  Pt's wife said she would consult with her son and daughter who lived locally, should she need support.    Alphonse Guild. Makynli Stills, Latanya Presser, LCAS Clinical Social Worker Ph: [phone]    '

## 2017-02-07 NOTE — Discharge Instructions (Signed)
Follow-up with your psychiatrist next week as scheduled.  Take all of your medications as directed.  Try to avoid getting angry.

## 2017-02-07 NOTE — ED Notes (Signed)
RN spoke to Education officer, museum.  He is trying to get Pt seen by Case Manager prior to leaving facility today.

## 2017-02-07 NOTE — ED Notes (Signed)
Pt refused signature and DC vitals.  Pt is not at all unhappy, he just "really wants to go home"

## 2017-02-07 NOTE — Progress Notes (Signed)
CSW met with Case Manager and introduced her to the pt and the pt's wife.  Pt will D/C shortly. CSW signing off.  Alphonse Guild. Tawonda Legaspi, Latanya Presser, LCAS Clinical Social Worker Ph: [phone]

## 2017-02-12 ENCOUNTER — Emergency Department (HOSPITAL_COMMUNITY)
Admission: EM | Admit: 2017-02-12 | Discharge: 2017-02-13 | Disposition: A | Discharge status: Other Acute Inpatient Hospital | Payer: Medicare Other | Attending: Emergency Medicine | Admitting: Emergency Medicine

## 2017-02-12 ENCOUNTER — Encounter (HOSPITAL_COMMUNITY): Payer: Self-pay

## 2017-02-12 DIAGNOSIS — I10 Essential (primary) hypertension: Secondary | ICD-10-CM | POA: Diagnosis not present

## 2017-02-12 DIAGNOSIS — I2510 Atherosclerotic heart disease of native coronary artery without angina pectoris: Secondary | ICD-10-CM | POA: Insufficient documentation

## 2017-02-12 DIAGNOSIS — Z87891 Personal history of nicotine dependence: Secondary | ICD-10-CM | POA: Diagnosis not present

## 2017-02-12 DIAGNOSIS — Z0000 Encounter for general adult medical examination without abnormal findings: Secondary | ICD-10-CM

## 2017-02-12 DIAGNOSIS — F419 Anxiety disorder, unspecified: Secondary | ICD-10-CM | POA: Insufficient documentation

## 2017-02-12 DIAGNOSIS — F03918 Unspecified dementia, unspecified severity, with other behavioral disturbance: Secondary | ICD-10-CM | POA: Diagnosis present

## 2017-02-12 DIAGNOSIS — Z8546 Personal history of malignant neoplasm of prostate: Secondary | ICD-10-CM | POA: Insufficient documentation

## 2017-02-12 DIAGNOSIS — I252 Old myocardial infarction: Secondary | ICD-10-CM | POA: Diagnosis not present

## 2017-02-12 DIAGNOSIS — R45851 Suicidal ideations: Secondary | ICD-10-CM | POA: Diagnosis present

## 2017-02-12 DIAGNOSIS — Z79899 Other long term (current) drug therapy: Secondary | ICD-10-CM | POA: Insufficient documentation

## 2017-02-12 DIAGNOSIS — F0391 Unspecified dementia with behavioral disturbance: Secondary | ICD-10-CM | POA: Diagnosis not present

## 2017-02-12 DIAGNOSIS — Z7982 Long term (current) use of aspirin: Secondary | ICD-10-CM | POA: Diagnosis not present

## 2017-02-12 DIAGNOSIS — G309 Alzheimer's disease, unspecified: Secondary | ICD-10-CM | POA: Insufficient documentation

## 2017-02-12 DIAGNOSIS — F329 Major depressive disorder, single episode, unspecified: Secondary | ICD-10-CM | POA: Diagnosis present

## 2017-02-12 DIAGNOSIS — Z955 Presence of coronary angioplasty implant and graft: Secondary | ICD-10-CM | POA: Diagnosis not present

## 2017-02-12 DIAGNOSIS — Z046 Encounter for general psychiatric examination, requested by authority: Secondary | ICD-10-CM

## 2017-02-12 DIAGNOSIS — F332 Major depressive disorder, recurrent severe without psychotic features: Secondary | ICD-10-CM | POA: Diagnosis not present

## 2017-02-12 LAB — COMPREHENSIVE METABOLIC PANEL
ALT: 13 U/L — ABNORMAL LOW (ref 17–63)
AST: 19 U/L (ref 15–41)
Albumin: 4.5 g/dL (ref 3.5–5.0)
Alkaline Phosphatase: 74 U/L (ref 38–126)
Anion gap: 6 (ref 5–15)
BUN: 11 mg/dL (ref 6–20)
CO2: 29 mmol/L (ref 22–32)
Calcium: 9.6 mg/dL (ref 8.9–10.3)
Chloride: 105 mmol/L (ref 101–111)
Creatinine, Ser: 1.19 mg/dL (ref 0.61–1.24)
GFR calc Af Amer: >60 mL/min (ref >60)
GFR calc non Af Amer: 59 mL/min — ABNORMAL LOW (ref >60)
Glucose, Bld: 116 mg/dL — ABNORMAL HIGH (ref 65–99)
Potassium: 4.2 mmol/L (ref 3.5–5.1)
Sodium: 140 mmol/L (ref 135–145)
Total Bilirubin: 0.8 mg/dL (ref 0.3–1.2)
Total Protein: 7.6 g/dL (ref 6.5–8.1)

## 2017-02-12 LAB — ACETAMINOPHEN LEVEL: Acetaminophen (Tylenol), Serum: <10 ug/mL — ABNORMAL LOW (ref 10–30)

## 2017-02-12 LAB — RAPID URINE DRUG SCREEN, HOSP PERFORMED
Amphetamines: NOT DETECTED
Barbiturates: NOT DETECTED
Benzodiazepines: NOT DETECTED
Cocaine: NOT DETECTED
Opiates: NOT DETECTED
Tetrahydrocannabinol: NOT DETECTED

## 2017-02-12 LAB — SALICYLATE LEVEL: Salicylate Lvl: <7 mg/dL (ref 2.8–30.0)

## 2017-02-12 LAB — CBC
HCT: 40.1 % (ref 39.0–52.0)
Hemoglobin: 13.6 g/dL (ref 13.0–17.0)
MCH: 27.9 pg (ref 26.0–34.0)
MCHC: 33.9 g/dL (ref 30.0–36.0)
MCV: 82.3 fL (ref 78.0–100.0)
Platelets: 210 10*3/uL (ref 150–400)
RBC: 4.87 MIL/uL (ref 4.22–5.81)
RDW: 14.3 % (ref 11.5–15.5)
WBC: 4.3 10*3/uL (ref 4.0–10.5)

## 2017-02-12 LAB — ETHANOL: Alcohol, Ethyl (B): <5 mg/dL (ref <5)

## 2017-02-12 MED ORDER — SERTRALINE HCL 50 MG PO TABS
25.0000 mg | ORAL_TABLET | Freq: Three times a day (TID) | ORAL | Status: DC
  Administered 2017-02-12 – 2017-02-13 (×2): 25 mg via ORAL
  Filled 2017-02-12 (×2): qty 1

## 2017-02-12 MED ORDER — CLONAZEPAM 0.5 MG PO TABS
0.5000 mg | ORAL_TABLET | Freq: Two times a day (BID) | ORAL | Status: DC
  Administered 2017-02-12: 0.5 mg via ORAL
  Filled 2017-02-12: qty 1

## 2017-02-12 MED ORDER — ZOLPIDEM TARTRATE 5 MG PO TABS: 5.0000 mg | ORAL_TABLET | Freq: Every evening | ORAL | Status: DC | PRN

## 2017-02-12 MED ORDER — CHLORPROMAZINE HCL 25 MG PO TABS
50.0000 mg | ORAL_TABLET | Freq: Two times a day (BID) | ORAL | Status: DC
  Administered 2017-02-12 – 2017-02-13 (×2): 50 mg via ORAL
  Filled 2017-02-12 (×3): qty 2

## 2017-02-12 MED ORDER — NICOTINE 21 MG/24HR TD PT24
21.0000 mg | MEDICATED_PATCH | Freq: Every day | TRANSDERMAL | Status: DC
  Filled 2017-02-12: qty 1

## 2017-02-12 MED ORDER — ASPIRIN EC 81 MG PO TBEC
81.0000 mg | DELAYED_RELEASE_TABLET | Freq: Every day | ORAL | Status: DC
  Administered 2017-02-13: 81 mg via ORAL
  Filled 2017-02-12: qty 1

## 2017-02-12 MED ORDER — LAMOTRIGINE 100 MG PO TABS
100.0000 mg | ORAL_TABLET | Freq: Two times a day (BID) | ORAL | Status: DC
  Administered 2017-02-12 – 2017-02-13 (×2): 100 mg via ORAL
  Filled 2017-02-12 (×2): qty 1

## 2017-02-12 MED ORDER — IBUPROFEN 200 MG PO TABS: 600.0000 mg | ORAL_TABLET | Freq: Three times a day (TID) | ORAL | Status: DC | PRN

## 2017-02-12 MED ORDER — ONDANSETRON HCL 4 MG PO TABS: 4.0000 mg | ORAL_TABLET | Freq: Three times a day (TID) | ORAL | Status: DC | PRN

## 2017-02-12 MED ORDER — ALUM & MAG HYDROXIDE-SIMETH 200-200-20 MG/5ML PO SUSP: 30.0000 mL | ORAL | Status: DC | PRN

## 2017-02-12 MED ORDER — RISPERIDONE 0.5 MG PO TABS
0.5000 mg | ORAL_TABLET | Freq: Every day | ORAL | Status: DC
  Administered 2017-02-12: 0.5 mg via ORAL
  Filled 2017-02-12 (×2): qty 1

## 2017-02-12 NOTE — ED Provider Notes (Signed)
Medical screening examination/treatment/procedure(s) were conducted as a shared visit with non-physician practitioner(s) and myself.  I personally evaluated the patient during the encounter.   EKG Interpretation None       Results for orders placed or performed during the hospital encounter of 02/12/17  Comprehensive metabolic panel  Result Value Ref Range   Sodium 140 135 - 145 mmol/L   Potassium 4.2 3.5 - 5.1 mmol/L   Chloride 105 101 - 111 mmol/L   CO2 29 22 - 32 mmol/L   Glucose, Bld 116 (H) 65 - 99 mg/dL   BUN 11 6 - 20 mg/dL   Creatinine, Ser 1.19 0.61 - 1.24 mg/dL   Calcium 9.6 8.9 - 10.3 mg/dL   Total Protein 7.6 6.5 - 8.1 g/dL   Albumin 4.5 3.5 - 5.0 g/dL   AST 19 15 - 41 U/L   ALT 13 (L) 17 - 63 U/L   Alkaline Phosphatase 74 38 - 126 U/L   Total Bilirubin 0.8 0.3 - 1.2 mg/dL   GFR calc non Af Amer 59 (L) >60 mL/min   GFR calc Af Amer >60 >60 mL/min   Anion gap 6 5 - 15  Ethanol  Result Value Ref Range   Alcohol, Ethyl (B) <5 <5 mg/dL  Salicylate level  Result Value Ref Range   Salicylate Lvl [id number] 2.8 - 30.0 mg/dL  Acetaminophen level  Result Value Ref Range   Acetaminophen (Tylenol), Serum <10 (L) 10 - 30 ug/mL  cbc  Result Value Ref Range   WBC 4.3 4.0 - 10.5 K/uL   RBC 4.87 4.22 - 5.81 MIL/uL   Hemoglobin 13.6 13.0 - 17.0 g/dL   HCT 40.1 39.0 - 52.0 %   MCV 82.3 78.0 - 100.0 fL   MCH 27.9 26.0 - 34.0 pg   MCHC 33.9 30.0 - 36.0 g/dL   RDW 14.3 11.5 - 15.5 %   Platelets 210 150 - 400 K/uL  Rapid urine drug screen (hospital performed)  Result Value Ref Range   Opiates NONE DETECTED NONE DETECTED   Cocaine NONE DETECTED NONE DETECTED   Benzodiazepines NONE DETECTED NONE DETECTED   Amphetamines NONE DETECTED NONE DETECTED   Tetrahydrocannabinol NONE DETECTED NONE DETECTED   Barbiturates NONE DETECTED NONE DETECTED   No results found.   Patient seen by me along with physician assistant. Patient denies any complaints here. Patient was sent in under an  IVC from his psychiatrist brought in by [facility] police department.  Judithann Graves climbs the patient is a threat to himself as well as his wife. States that he's attempted to hang himself several times at home. Patient denies any here.  Patient does have a history of Alzheimer's disease. Patient does have a history of past suicide attempts. Patient will require evaluation by behavioral health.  Patient hears alert will follow commands but denies knowing why he is here. Denies being suicidal or planning to hurt anybody.   Fredia Sorrow, MD 02/12/17 2001

## 2017-02-12 NOTE — ED Notes (Signed)
TTS consulting on pt.

## 2017-02-12 NOTE — ED Triage Notes (Signed)
Patient states his psychiatrist sent him to the ED. Patient accompanied by [employer]. Patient has IVC papers that were completed by the psychiatrist. IVC papers state that the patient is a harm to himself and to his wife. States that the patient has had several attempts of trying to hang himself. Patient denies any auditory or visual hallucinations.

## 2017-02-12 NOTE — ED Provider Notes (Signed)
[place] DEPT Provider Note   CSN: [id number] Arrival date & time: 02/12/17  1649     History   Chief Complaint Chief Complaint  Patient presents with  . IVC  . Suicidal  . Homicidal    HPI Anthony Skinner is a 76 y.o. male with a pmh alzheimers and previous psych admissions . He is here under IVC by his psychiatrist today for SI/HI. IVC paper work states that the patient has been threatening to kill himself and his wife. He has a history of previous suicide attempt. He denies any of this and states that he has no idea why he is here.  HPI  Past Medical History:  Diagnosis Date  . Alzheimer disease   . CAD (coronary artery disease)    a. reported h/o MI in the 83's;  b. 04/2000 Cath: LM nl, LAD 40p, D1 small, nl, RI nl, LCX nl, RCA nl.  . Dementia   . DJD (degenerative joint disease)   . Fatty liver   . Gastropathy 2012   reactive  . GERD (gastroesophageal reflux disease)   . Hiatal hernia   . Hyperlipidemia   . Hypertension   . Hypertension   . Iron deficiency anemia   . Myocardial infarction    " BACK IN THE 90'S"  . Prostate cancer ([place])    a. 09/2008 s/p prostatectomy.  . PUD (peptic ulcer disease)   . Recurrent spontaneous pneumothorax    a. s/p L lobectomy in 1966.  Marland Kitchen Shortness of breath   . Suicide attempt    a. 08/2013 attempt by hanging with subsequent resp failure  . Syncope    a. in setting of GIB in 2012, presumed to be orthostatic.  . Tubular adenoma of colon 2012  . Upper GI bleed    a. 2012  . Vertebral artery stenosis    a. 09/2010 s/p L vertebral stenting 09/2010.    Patient Active Problem List   Diagnosis Date Noted  . Suicidal ideation   . Dementia with behavioral disturbance 09/01/2015  . Dementia   . Major depressive disorder without psychotic features ([place]) 05/24/2015  . Aggressive behavior   . Chest pain 09/15/2013  . Coronary atherosclerosis of native coronary artery 09/15/2013  . Hypokalemia 08/28/2013  . Acute respiratory  failure ([place]) 08/25/2013  . Altered mental status 08/25/2013  . Anoxic brain injury ([place]) 08/25/2013  . HTN (hypertension) 08/25/2013  . Suicide attempt 08/25/2013  . Vitamin B 12 deficiency 07/15/2013  . Unspecified hereditary and idiopathic peripheral neuropathy 07/15/2013  . Memory loss 07/15/2013    Past Surgical History:  Procedure Laterality Date  . CARDIAC CATHETERIZATION  09/15/2013  . CARDIAC SURGERY    . CATARACT EXTRACTION Right   . LEFT HEART CATHETERIZATION WITH CORONARY ANGIOGRAM N/A 09/15/2013   Procedure: LEFT HEART CATHETERIZATION WITH CORONARY ANGIOGRAM;  Surgeon: Burnell Blanks, MD;  Location: [facility] CATH LAB;  Service: Cardiovascular;  Laterality: N/A;  . LUNG REMOVAL, PARTIAL  1960s   left  . PROSTATECTOMY    . vertebral artery stent         Home Medications    Prior to Admission medications   Medication Sig Start Date End Date Taking? Authorizing Provider  aspirin EC 81 MG tablet Take 81 mg by mouth daily.   Yes Historical Provider, MD  chlorproMAZINE (THORAZINE) 50 MG tablet Take 50 mg by mouth 2 (two) times daily. 02/02/17  Yes Historical Provider, MD  clonazePAM (KLONOPIN) 0.5 MG tablet Take 0.5 mg  by mouth 2 (two) times daily. 07/04/16  Yes Historical Provider, MD  lamoTRIgine (LAMICTAL) 100 MG tablet Take 100 mg by mouth 2 (two) times daily. 10/04/15  Yes Historical Provider, MD  risperiDONE (RISPERDAL) 0.5 MG tablet Take 1 tablet (0.5 mg total) by mouth at bedtime. 06/13/15  Yes Delfin Gant, NP  sertraline (ZOLOFT) 25 MG tablet Take 25 mg by mouth 3 (three) times daily.  01/02/16  Yes Historical Provider, MD    Family History Family History  Problem Relation Age of Onset  . Alzheimer's disease Mother   . Heart attack Father   . Prostate cancer Brother     Social History Social History  Substance Use Topics  . Smoking status: Former Smoker    Packs/day: 1.00    Years: 40.00    Types: Cigarettes    Quit date: 08/27/2002  . Smokeless  tobacco: Never Used  . Alcohol use No     Comment: Hx heavy EtOH use but quit 2011     Allergies   Patient has no known allergies.   Review of Systems Review of Systems  Ten systems reviewed and are negative for acute change, except as noted in the HPI.   Physical Exam Updated Vital Signs BP 150/66   Pulse 77   Temp 97.7 F (36.5 C) (Oral)   Resp 18   Ht 6' (1.829 m)   Wt 73.5 kg   SpO2 100%   BMI 21.97 kg/m   Physical Exam  Constitutional: He appears well-developed and well-nourished. No distress.  HENT:  Head: Normocephalic and atraumatic.  Eyes: Conjunctivae are normal. No scleral icterus.  Neck: Normal range of motion. Neck supple.  Cardiovascular: Normal rate, regular rhythm and normal heart sounds.   Pulmonary/Chest: Effort normal and breath sounds normal. No respiratory distress.  Abdominal: Soft. There is no tenderness.  Musculoskeletal: He exhibits no edema.  Neurological: He is alert.  Skin: Skin is warm and dry. He is not diaphoretic.  Psychiatric: His behavior is normal.  Nursing note and vitals reviewed.    ED Treatments / Results  Labs (all labs ordered are listed, but only abnormal results are displayed) Labs Reviewed  COMPREHENSIVE METABOLIC PANEL - Abnormal; Notable for the following:       Result Value   Glucose, Bld 116 (*)    ALT 13 (*)    GFR calc non Af Amer 59 (*)    All other components within normal limits  ACETAMINOPHEN LEVEL - Abnormal; Notable for the following:    Acetaminophen (Tylenol), Serum <10 (*)    All other components within normal limits  ETHANOL  SALICYLATE LEVEL  CBC  RAPID URINE DRUG SCREEN, HOSP PERFORMED    EKG  EKG Interpretation None       Radiology No results found.  Procedures Procedures (including critical care time)  Medications Ordered in ED Medications  alum & mag hydroxide-simeth (MAALOX/MYLANTA) 200-200-20 MG/5ML suspension 30 mL (not administered)  ondansetron (ZOFRAN) tablet 4 mg  (not administered)  nicotine (NICODERM CQ - dosed in mg/24 hours) patch 21 mg (21 mg Transdermal Not Given 02/12/17 1816)  zolpidem (AMBIEN) tablet 5 mg (not administered)  ibuprofen (ADVIL,MOTRIN) tablet 600 mg (not administered)  aspirin EC tablet 81 mg (not administered)  chlorproMAZINE (THORAZINE) tablet 50 mg (not administered)  clonazePAM (KLONOPIN) tablet 0.5 mg (not administered)  lamoTRIgine (LAMICTAL) tablet 100 mg (not administered)  risperiDONE (RISPERDAL) tablet 0.5 mg (not administered)  sertraline (ZOLOFT) tablet 25 mg (not administered)  Initial Impression / Assessment and Plan / ED Course  I have reviewed the triage vital signs and the nursing notes.  Pertinent labs & imaging results that were available during my care of the patient were reviewed by me and considered in my medical decision making (see chart for details).     Medically clear for psych eval  Final Clinical Impressions(s) / ED Diagnoses   Final diagnoses:  Involuntary commitment    New Prescriptions New Prescriptions   No medications on file     Margarita Mail, PA-C 02/12/17 2143    Fredia Sorrow, MD 02/12/17 2326

## 2017-02-12 NOTE — ED Notes (Signed)
Pt has been seen and wand by security. 
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
## 2017-02-13 ENCOUNTER — Emergency Department (HOSPITAL_COMMUNITY): Payer: Medicare Other

## 2017-02-13 DIAGNOSIS — R45850 Homicidal ideations: Secondary | ICD-10-CM | POA: Diagnosis not present

## 2017-02-13 DIAGNOSIS — F332 Major depressive disorder, recurrent severe without psychotic features: Secondary | ICD-10-CM | POA: Diagnosis not present

## 2017-02-13 DIAGNOSIS — R45851 Suicidal ideations: Secondary | ICD-10-CM | POA: Diagnosis not present

## 2017-02-13 DIAGNOSIS — Z79899 Other long term (current) drug therapy: Secondary | ICD-10-CM

## 2017-02-13 DIAGNOSIS — F0391 Unspecified dementia with behavioral disturbance: Secondary | ICD-10-CM | POA: Diagnosis not present

## 2017-02-13 DIAGNOSIS — Z87891 Personal history of nicotine dependence: Secondary | ICD-10-CM | POA: Diagnosis not present

## 2017-02-13 DIAGNOSIS — F323 Major depressive disorder, single episode, severe with psychotic features: Secondary | ICD-10-CM | POA: Insufficient documentation

## 2017-02-13 LAB — URINALYSIS, ROUTINE W REFLEX MICROSCOPIC
Bilirubin Urine: NEGATIVE
Glucose, UA: NEGATIVE mg/dL
Hgb urine dipstick: NEGATIVE
Ketones, ur: NEGATIVE mg/dL
Leukocytes, UA: NEGATIVE
Nitrite: NEGATIVE
Protein, ur: NEGATIVE mg/dL
Specific Gravity, Urine: 1.002 — ABNORMAL LOW (ref 1.005–1.030)
pH: 7 (ref 5.0–8.0)
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
## 2017-02-13 NOTE — Care Management (Signed)
Patient  Noted to hav bed at [facility].

## 2017-02-13 NOTE — ED Notes (Signed)
Attempted to call wife at [phone] to ensure that she is aware of transfer destination. NO answer x2 attempts.

## 2017-02-13 NOTE — BH Assessment (Signed)
[place] Assessment Progress Note  Per Corena Pilgrim, MD, this pt requires psychiatric hospitalization at this time.  Pt presents under IVC initiated by Dr Para Skeans, his provider at [facility].  At 15:10 Melissa calls from [facility] to report that pt has been accepted to their facility by Geanie Kenning, MD.  EDP Duffy Bruce, MDconcurs with this decision.  Pt's nurse has been notified, and agrees to call report to [phone].  Pt is to be transported via [facility], or by [employer] with sheriff accompanying.  Jalene Mullet, [place] Triage Specialist [phone]

## 2017-02-13 NOTE — ED Provider Notes (Signed)
Patient accepted to [facility] psychiatric unit. Labs and imaging reviewed and are unremarkable. Vital signs are stable. Patient is medically stable for psychiatric admission.   Duffy Bruce, MD 02/13/17 1515

## 2017-02-14 DIAGNOSIS — K219 Gastro-esophageal reflux disease without esophagitis: Secondary | ICD-10-CM | POA: Insufficient documentation

## 2017-02-14 DIAGNOSIS — N179 Acute kidney failure, unspecified: Secondary | ICD-10-CM | POA: Insufficient documentation

## 2017-03-03 ENCOUNTER — Encounter (HOSPITAL_COMMUNITY): Payer: Self-pay | Admitting: Emergency Medicine

## 2017-03-03 ENCOUNTER — Emergency Department (HOSPITAL_COMMUNITY)
Admission: EM | Admit: 2017-03-03 | Discharge: 2017-03-03 | Disposition: A | Discharge status: Home / Self Care | Payer: Medicare Other | Attending: Dermatology | Admitting: Dermatology

## 2017-03-03 DIAGNOSIS — I10 Essential (primary) hypertension: Secondary | ICD-10-CM | POA: Insufficient documentation

## 2017-03-03 DIAGNOSIS — I2510 Atherosclerotic heart disease of native coronary artery without angina pectoris: Secondary | ICD-10-CM | POA: Diagnosis not present

## 2017-03-03 DIAGNOSIS — Z79899 Other long term (current) drug therapy: Secondary | ICD-10-CM | POA: Diagnosis not present

## 2017-03-03 DIAGNOSIS — Z5321 Procedure and treatment not carried out due to patient leaving prior to being seen by health care provider: Secondary | ICD-10-CM | POA: Diagnosis not present

## 2017-03-03 DIAGNOSIS — I252 Old myocardial infarction: Secondary | ICD-10-CM | POA: Diagnosis not present

## 2017-03-03 DIAGNOSIS — Z7982 Long term (current) use of aspirin: Secondary | ICD-10-CM | POA: Insufficient documentation

## 2017-03-03 DIAGNOSIS — Z955 Presence of coronary angioplasty implant and graft: Secondary | ICD-10-CM | POA: Insufficient documentation

## 2017-03-03 DIAGNOSIS — F0390 Unspecified dementia without behavioral disturbance: Secondary | ICD-10-CM | POA: Diagnosis present

## 2017-03-03 DIAGNOSIS — Z87891 Personal history of nicotine dependence: Secondary | ICD-10-CM | POA: Diagnosis not present

## 2017-03-03 NOTE — ED Notes (Signed)
Alex, PA went to bedside to assess patient, spouse had just came in from being at home. Pt and spouse reported to [place], [place] and myself that he would like to leave and will return if he gets worse tonight. Pt has been calm and cooperative. Pt's spouse reports they have someone at home with a disability. Pt is ambulatory with a steady gait.
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
## 2017-03-03 NOTE — ED Notes (Signed)
Bed: [facility] Expected date:  Expected time:  Means of arrival:  Comments: 

## 2017-07-23 DIAGNOSIS — N35919 Unspecified urethral stricture, male, unspecified site: Secondary | ICD-10-CM | POA: Insufficient documentation

## 2018-05-24 ENCOUNTER — Emergency Department (HOSPITAL_COMMUNITY)
Admission: EM | Admit: 2018-05-24 | Discharge: 2018-05-25 | Disposition: A | Discharge status: Home / Self Care | Payer: Medicare Other | Attending: Emergency Medicine | Admitting: Emergency Medicine

## 2018-05-24 ENCOUNTER — Emergency Department (HOSPITAL_COMMUNITY): Payer: Medicare Other

## 2018-05-24 ENCOUNTER — Encounter (HOSPITAL_COMMUNITY): Payer: Self-pay | Admitting: Emergency Medicine

## 2018-05-24 DIAGNOSIS — R4587 Impulsiveness: Secondary | ICD-10-CM | POA: Diagnosis not present

## 2018-05-24 DIAGNOSIS — Z87891 Personal history of nicotine dependence: Secondary | ICD-10-CM | POA: Insufficient documentation

## 2018-05-24 DIAGNOSIS — I252 Old myocardial infarction: Secondary | ICD-10-CM | POA: Diagnosis not present

## 2018-05-24 DIAGNOSIS — F329 Major depressive disorder, single episode, unspecified: Secondary | ICD-10-CM | POA: Diagnosis present

## 2018-05-24 DIAGNOSIS — F419 Anxiety disorder, unspecified: Secondary | ICD-10-CM | POA: Diagnosis not present

## 2018-05-24 DIAGNOSIS — R45851 Suicidal ideations: Secondary | ICD-10-CM | POA: Diagnosis not present

## 2018-05-24 DIAGNOSIS — I2510 Atherosclerotic heart disease of native coronary artery without angina pectoris: Secondary | ICD-10-CM | POA: Diagnosis not present

## 2018-05-24 DIAGNOSIS — I10 Essential (primary) hypertension: Secondary | ICD-10-CM | POA: Insufficient documentation

## 2018-05-24 DIAGNOSIS — F322 Major depressive disorder, single episode, severe without psychotic features: Secondary | ICD-10-CM | POA: Diagnosis not present

## 2018-05-24 DIAGNOSIS — G309 Alzheimer's disease, unspecified: Secondary | ICD-10-CM | POA: Diagnosis not present

## 2018-05-24 HISTORY — DX: Depression, unspecified: F32.A

## 2018-05-24 HISTORY — DX: Major depressive disorder, single episode, unspecified: F32.9

## 2018-05-24 LAB — COMPREHENSIVE METABOLIC PANEL
ALT: 14 U/L — ABNORMAL LOW (ref 17–63)
AST: 25 U/L (ref 15–41)
Albumin: 4.6 g/dL (ref 3.5–5.0)
Alkaline Phosphatase: 68 U/L (ref 38–126)
Anion gap: 12 (ref 5–15)
BUN: 13 mg/dL (ref 6–20)
CO2: 25 mmol/L (ref 22–32)
Calcium: 9.3 mg/dL (ref 8.9–10.3)
Chloride: 99 mmol/L — ABNORMAL LOW (ref 101–111)
Creatinine, Ser: 1.11 mg/dL (ref 0.61–1.24)
GFR calc Af Amer: >60 mL/min (ref >60)
GFR calc non Af Amer: >60 mL/min (ref >60)
Glucose, Bld: 93 mg/dL (ref 65–99)
Potassium: 4 mmol/L (ref 3.5–5.1)
Sodium: 136 mmol/L (ref 135–145)
Total Bilirubin: 1 mg/dL (ref 0.3–1.2)
Total Protein: 8 g/dL (ref 6.5–8.1)

## 2018-05-24 LAB — RAPID URINE DRUG SCREEN, HOSP PERFORMED
Amphetamines: NOT DETECTED
Barbiturates: NOT DETECTED
Benzodiazepines: NOT DETECTED
Cocaine: NOT DETECTED
Opiates: NOT DETECTED
Tetrahydrocannabinol: NOT DETECTED

## 2018-05-24 LAB — CBC
HCT: 44.5 % (ref 39.0–52.0)
Hemoglobin: 15 g/dL (ref 13.0–17.0)
MCH: 28.6 pg (ref 26.0–34.0)
MCHC: 33.7 g/dL (ref 30.0–36.0)
MCV: 84.8 fL (ref 78.0–100.0)
Platelets: 250 10*3/uL (ref 150–400)
RBC: 5.25 MIL/uL (ref 4.22–5.81)
RDW: 15 % (ref 11.5–15.5)
WBC: 6.1 10*3/uL (ref 4.0–10.5)

## 2018-05-24 LAB — ETHANOL: Alcohol, Ethyl (B): <10 mg/dL (ref <10)

## 2018-05-24 MED ORDER — ALUM & MAG HYDROXIDE-SIMETH 200-200-20 MG/5ML PO SUSP: 30.0000 mL | Freq: Four times a day (QID) | ORAL | Status: DC | PRN

## 2018-05-24 MED ORDER — SERTRALINE HCL 50 MG PO TABS: 100.0000 mg | ORAL_TABLET | Freq: Every day | ORAL | Status: DC

## 2018-05-24 MED ORDER — ONDANSETRON HCL 4 MG PO TABS: 4.0000 mg | ORAL_TABLET | Freq: Three times a day (TID) | ORAL | Status: DC | PRN

## 2018-05-24 MED ORDER — SERTRALINE HCL 50 MG PO TABS
100.0000 mg | ORAL_TABLET | Freq: Every day | ORAL | Status: DC
  Administered 2018-05-24 – 2018-05-25 (×2): 100 mg via ORAL
  Filled 2018-05-24: qty 2

## 2018-05-24 MED ORDER — ACETAMINOPHEN 325 MG PO TABS: 650.0000 mg | ORAL_TABLET | ORAL | Status: DC | PRN

## 2018-05-24 MED ORDER — QUETIAPINE FUMARATE 100 MG PO TABS
100.0000 mg | ORAL_TABLET | Freq: Every day | ORAL | Status: DC
  Administered 2018-05-24: 100 mg via ORAL
  Filled 2018-05-24: qty 1

## 2018-05-24 MED ORDER — SERTRALINE HCL 50 MG PO TABS
100.0000 mg | ORAL_TABLET | Freq: Every day | ORAL | Status: DC
  Filled 2018-05-24: qty 2

## 2018-05-24 NOTE — ED Provider Notes (Signed)
[facility] DEPT Provider Note   CSN: [id number] Arrival date & time: 05/24/18  0911     History   Chief Complaint Chief Complaint  Patient presents with  . Suicidal    HPI Anthony Skinner is a 77 y.o. male.  HPI 77 yo male presents to ER requesting assistance with increasing suicidal thoughts over the past 4 days. Hx of suicide attempt in the past. Tried to hang himself 4 years ago. Pt with early dementia per family. Currently only on zoloft. Follows with a psychiatrist. No other new medicines.  No tobacco abuse. No ETOH use for 6 years ago   Past Medical History:  Diagnosis Date  . Alzheimer disease   . CAD (coronary artery disease)    a. reported h/o MI in the 16's;  b. 04/2000 Cath: LM nl, LAD 40p, D1 small, nl, RI nl, LCX nl, RCA nl.  . Dementia   . DJD (degenerative joint disease)   . Fatty liver   . Gastropathy 2012   reactive  . GERD (gastroesophageal reflux disease)   . Hiatal hernia   . Hyperlipidemia   . Hypertension   . Hypertension   . Iron deficiency anemia   . Myocardial infarction ([place])    " BACK IN THE 90'S"  . Prostate cancer ([place])    a. 09/2008 s/p prostatectomy.  . PUD (peptic ulcer disease)   . Recurrent spontaneous pneumothorax    a. s/p L lobectomy in 1966.  Marland Kitchen Shortness of breath   . Suicide attempt ([place])    a. 08/2013 attempt by hanging with subsequent resp failure  . Syncope    a. in setting of GIB in 2012, presumed to be orthostatic.  . Tubular adenoma of colon 2012  . Upper GI bleed    a. 2012  . Vertebral artery stenosis    a. 09/2010 s/p L vertebral stenting 09/2010.    Patient Active Problem List   Diagnosis Date Noted  . Suicidal ideation   . Dementia with behavioral disturbance 09/01/2015  . Dementia   . Major depressive disorder without psychotic features ([place]) 05/24/2015  . Aggressive behavior   . Chest pain 09/15/2013  . Coronary atherosclerosis of native coronary artery 09/15/2013  .  Hypokalemia 08/28/2013  . Acute respiratory failure ([place]) 08/25/2013  . Altered mental status 08/25/2013  . Anoxic brain injury ([place]) 08/25/2013  . HTN (hypertension) 08/25/2013  . Suicide attempt ([place]) 08/25/2013  . Vitamin B 12 deficiency 07/15/2013  . Unspecified hereditary and idiopathic peripheral neuropathy 07/15/2013  . Memory loss 07/15/2013    Past Surgical History:  Procedure Laterality Date  . CARDIAC CATHETERIZATION  09/15/2013  . CARDIAC SURGERY    . CATARACT EXTRACTION Right   . LEFT HEART CATHETERIZATION WITH CORONARY ANGIOGRAM N/A 09/15/2013   Procedure: LEFT HEART CATHETERIZATION WITH CORONARY ANGIOGRAM;  Surgeon: Burnell Blanks, MD;  Location: [facility] CATH LAB;  Service: Cardiovascular;  Laterality: N/A;  . LUNG REMOVAL, PARTIAL  1960s   left  . PROSTATECTOMY    . vertebral artery stent          Home Medications    Prior to Admission medications   Medication Sig Start Date End Date Taking? Authorizing Provider  sertraline (ZOLOFT) 25 MG tablet Take 25 mg by mouth 3 (three) times daily.  01/02/16   [provider]    Family History Family History  Problem Relation Age of Onset  . Alzheimer's disease Mother   . Heart attack Father   .  Prostate cancer Brother     Social History Social History   Tobacco Use  . Smoking status: Former Smoker    Packs/day: 1.00    Years: 40.00    Pack years: 40.00    Types: Cigarettes    Last attempt to quit: 08/27/2002    Years since quitting: 15.7  . Smokeless tobacco: Never Used  Substance Use Topics  . Alcohol use: No    Comment: Hx heavy EtOH use but quit 2011  . Drug use: No     Allergies   Patient has no known allergies.   Review of Systems Review of Systems  All other systems reviewed and are negative.    Physical Exam Updated Vital Signs BP 136/72 (BP Location: Right Arm)   Pulse 73   Temp 98 F (36.7 C) (Oral)   Resp 18   Ht 6\' 2"  (1.88 m)   Wt 62.3 kg (137 lb 6 oz)   SpO2  99%   BMI 17.64 kg/m   Physical Exam  Constitutional: He is oriented to person, place, and time. He appears well-developed and well-nourished.  HENT:  Head: Normocephalic and atraumatic.  Eyes: EOM are normal.  Neck: Normal range of motion.  Cardiovascular: Normal rate and regular rhythm.  Pulmonary/Chest: Effort normal.  Abdominal: Soft. He exhibits no distension.  Musculoskeletal: Normal range of motion.  Neurological: He is alert and oriented to person, place, and time.  Skin: Skin is warm and dry.  Psychiatric:  Suicidal thoughts  Nursing note and vitals reviewed.    ED Treatments / Results  Labs (all labs ordered are listed, but only abnormal results are displayed) Labs Reviewed  COMPREHENSIVE METABOLIC PANEL  ETHANOL  CBC  RAPID URINE DRUG SCREEN, HOSP PERFORMED    EKG None  Radiology No results found.  Procedures Procedures (including critical care time)  Medications Ordered in ED Medications  sertraline (ZOLOFT) tablet 100 mg (has no administration in time range)     Initial Impression / Assessment and Plan / ED Course  I have reviewed the triage vital signs and the nursing notes.  Pertinent labs & imaging results that were available during my care of the patient were reviewed by me and considered in my medical decision making (see chart for details).     SI without recent attempt. Pt voluntary requesting assistance. Medically clear  Final Clinical Impressions(s) / ED Diagnoses   Final diagnoses:  Suicidal thoughts    ED Discharge Orders    None       Jola Schmidt, MD 05/24/18 [phone]

## 2018-05-24 NOTE — BH Assessment (Signed)
Assessment Note  Anthony Skinner is a 77 y.o. male who presented to [facility] on voluntary basis with complaint of suicidal ideation and other depressive symptoms.  Pt was last assessed by TTS in February 2019.  At that time, he presented with suicidal ideation and was admitted to [facility] for treatment.  Pt lives in [place] and lives with his wife Shirlee Limerick.  Pt reported that for two months, he has experienced the following symptoms:  Suicidal ideation with plan to overdose or cut wrists; persistent and unremitting despondency; feelings of hopelessness; irritability and anger.  Pt endorsed a history of depression.  Per report, he attempted suicide by hanging about four years ago, and he has experienced numerous bouts of suicidal ideation.  Per hospital report, Pt has a diagnosis of Alzheimer Disease.    During assessment, Pt presented as alert and oriented.  He had good eye contact and was cooperative.  Pt was dressed in scrubs, and he appeared appropriately groomed.  Pt's mood was depressed.  Affect was mood-congruent.  Pt endorsed suicidal ideation with plan and other depressive symptoms.  Pt denied substance use, hallucination, homicidal ideation, and self-injurious behavior.  Pt's thought processes were within normal range, and thought content was logical.  Speech was normal in rate, rhythm, and volume.  Insight, judgment, and impulse control were fair.  Memory and concentration were good.  Consulted with Darleene Cleaver, MD, who determined that Pt meets inpatient criteria -- gero-psych.  Diagnosis:   Past Medical History:  Past Medical History:  Diagnosis Date  . Alzheimer disease   . CAD (coronary artery disease)    a. reported h/o MI in the 45's;  b. 04/2000 Cath: LM nl, LAD 40p, D1 small, nl, RI nl, LCX nl, RCA nl.  . Dementia   . Depression   . DJD (degenerative joint disease)   . Fatty liver   . Gastropathy 2012   reactive  . GERD (gastroesophageal reflux disease)   . Hiatal  hernia   . Hyperlipidemia   . Hypertension   . Hypertension   . Iron deficiency anemia   . Myocardial infarction ([place])    " BACK IN THE 90'S"  . Prostate cancer ([place])    a. 09/2008 s/p prostatectomy.  . PUD (peptic ulcer disease)   . Recurrent spontaneous pneumothorax    a. s/p L lobectomy in 1966.  Marland Kitchen Shortness of breath   . Suicide attempt ([place])    a. 08/2013 attempt by hanging with subsequent resp failure  . Syncope    a. in setting of GIB in 2012, presumed to be orthostatic.  . Tubular adenoma of colon 2012  . Upper GI bleed    a. 2012  . Vertebral artery stenosis    a. 09/2010 s/p L vertebral stenting 09/2010.    Past Surgical History:  Procedure Laterality Date  . CARDIAC CATHETERIZATION  09/15/2013  . CARDIAC SURGERY    . CATARACT EXTRACTION Right   . LEFT HEART CATHETERIZATION WITH CORONARY ANGIOGRAM N/A 09/15/2013   Procedure: LEFT HEART CATHETERIZATION WITH CORONARY ANGIOGRAM;  Surgeon: Burnell Blanks, MD;  Location: [facility] CATH LAB;  Service: Cardiovascular;  Laterality: N/A;  . LUNG REMOVAL, PARTIAL  1960s   left  . PROSTATECTOMY    . vertebral artery stent      Family History:  Family History  Problem Relation Age of Onset  . Alzheimer's disease Mother   . Heart attack Father   . Prostate cancer Brother     Social  History:  reports that he quit smoking about 15 years ago. His smoking use included cigarettes. He has a 40.00 pack-year smoking history. He has never used smokeless tobacco. He reports that he does not drink alcohol or use drugs.  Additional Social History:  Alcohol / Drug Use Pain Medications: See MAR Prescriptions: See MAR Over the Counter: See MAR History of alcohol / drug use?: No history of alcohol / drug abuse  CIWA: CIWA-Ar BP: 136/74 Pulse Rate: 81 COWS:    Allergies: No Known Allergies  Home Medications:  (Not in a hospital admission)  OB/GYN Status:  No LMP for male patient.  General Assessment Data [facility] of  Ass[facility]: [facility] Assessment: In system Is this a Tele or Face-to-Face Assessment?: Face-to-Face Is this an Initial Assessment or a Re-assessment for this encounter?: Initial Assessment Marital status: Married Is patient pregnant?: No Pregnancy Status: No Living Arrangements: Spouse/significant other Can pt return to current living arrangement?: Yes Admission Status: Voluntary Is patient capable of signing voluntary admission?: Yes Referral Source: Self/Family/Friend Insurance type: [facility] MCR     Crisis Care Plan Living Arrangements: Spouse/significant other Name of Psychiatrist: Dr. Agricultural engineer)  Education Status Is patient currently in school?: No Is the patient employed, unemployed or receiving disability?: Unemployed(Retired)  Risk to self with the past 6 months Suicidal Ideation: Yes-Currently Present Has patient been a risk to self within the past 6 months prior to admission? : Yes Suicidal Intent: No-Not Currently/Within Last 6 Months Has patient had any suicidal intent within the past 6 months prior to admission? : Yes Is patient at risk for suicide?: Yes Suicidal Plan?: Yes-Currently Present Has patient had any suicidal plan within the past 6 months prior to admission? : Yes Specify Current Suicidal Plan: Overdose What has been your use of drugs/alcohol within the last 12 months?: None Previous Attempts/Gestures: Yes How many times?: 2 Other Self Harm Risks: Dementia Triggers for Past Attempts: Unknown, Unpredictable Intentional Self Injurious Behavior: None Family Suicide History: Yes(Possibly grandfather) Recent stressful life event(s): Other (Comment)(Possibly dementia) Persecutory voices/beliefs?: No Depression: Yes Depression Symptoms: Despondent, Isolating, Loss of interest in usual pleasures, Feeling worthless/self pity, Feeling angry/irritable Substance abuse history and/or treatment for substance abuse?: No Suicide prevention information given to  non-admitted patients: Not applicable  Risk to Others within the past 6 months Homicidal Ideation: No Does patient have any lifetime risk of violence toward others beyond the six months prior to admission? : No Thoughts of Harm to Others: No Current Homicidal Intent: No Current Homicidal Plan: No Access to Homicidal Means: No History of harm to others?: No Assessment of Violence: In past 6-12 months Violent Behavior Description: Fights with wife Does patient have access to weapons?: No Criminal Charges Pending?: No Does patient have a court date: No Is patient on probation?: No  Psychosis Hallucinations: None noted Delusions: None noted  Mental Status Report Appearance/Hygiene: Unremarkable, In scrubs Eye Contact: Good Motor Activity: Freedom of movement, Unremarkable Speech: Logical/coherent Level of Consciousness: Alert Mood: Depressed Affect: Appropriate to circumstance Anxiety Level: None Thought Processes: Coherent, Relevant Judgement: Partial Orientation: Person, Place, Time, Situation Obsessive Compulsive Thoughts/Behaviors: None  Cognitive Functioning Concentration: Normal Memory: Recent Intact, Remote Intact Is patient IDD: No Is patient DD?: No I IQ score available?: No Insight: Fair Impulse Control: Fair Appetite: Fair Have you had any weight changes? : Loss Amount of the weight change? (lbs): (Pt said appetite is good, but loses weight) Sleep: No Change Vegetative Symptoms: None  ADLScreening [facility] Assessment Services) Patient's  cognitive ability adequate to safely complete daily activities?: Yes Patient able to express need for assistance with ADLs?: Yes Independently performs ADLs?: Yes (appropriate for developmental age)  Prior Inpatient Therapy Prior Inpatient Therapy: Yes Prior Therapy Dates: 2019, 2018 and other Prior Therapy Facilty/Provider(s): [facility] Reason for Treatment: Depression, SI  Prior Outpatient  Therapy Prior Outpatient Therapy: Yes Prior Therapy Dates: Ongoing Prior Therapy Facilty/Provider(s): Dr. Para Skeans Reason for Treatment: Depression Does patient have an ACCT team?: No Does patient have Intensive In-House Services?  : No Does patient have [facility] services? : No Does patient have P4CC services?: No  ADL Screening (condition at time of admission) Patient's cognitive ability adequate to safely complete daily activities?: Yes Is the patient deaf or have difficulty hearing?: No Does the patient have difficulty seeing, even when wearing glasses/contacts?: No Does the patient have difficulty concentrating, remembering, or making decisions?: No Patient able to express need for assistance with ADLs?: Yes Does the patient have difficulty dressing or bathing?: No Independently performs ADLs?: Yes (appropriate for developmental age) Does the patient have difficulty walking or climbing stairs?: No Weakness of Legs: None Weakness of Arms/Hands: None  Home Assistive Devices/Equipment Home Assistive Devices/Equipment: None  Therapy Consults (therapy consults require a physician order) PT Evaluation Needed: No OT Evalulation Needed: No SLP Evaluation Needed: No Abuse/Neglect Assessment (Assessment to be complete while patient is alone) Abuse/Neglect Assessment Can Be Completed: Yes Physical Abuse: Denies Verbal Abuse: Denies Sexual Abuse: Denies Exploitation of patient/patient's resources: Denies Self-Neglect: Denies Values / Beliefs Cultural Requests During Hospitalization: None Spiritual Requests During Hospitalization: None Consults Spiritual Care Consult Needed: No Social Work Consult Needed: No      Additional Information 1:1 In Past 12 Months?: No CIRT Risk: No Elopement Risk: No Does patient have medical clearance?: Yes     Disposition:  Disposition Initial Assessment Completed for this Encounter: Yes Disposition of Patient: Admit Type of inpatient  treatment program: Adult(Per Ak[place], MD, Pt meets inpt criteria -- Geropsych)  On Site Evaluation by:   Reviewed with Physician:    Laurena Slimmer Gradie Butrick 05/24/2018 10:45 AM

## 2018-05-24 NOTE — ED Notes (Signed)
Anthony Skinner [phone] home. Pt wife can call if need any information or updates.

## 2018-05-24 NOTE — ED Triage Notes (Signed)
Pt brought in for SI that has been thoughts since he tried hanging himself 4 years ago. Pt is in first stages of dementia and taking medications. Pt is wanting help for the SI.

## 2018-05-25 DIAGNOSIS — F419 Anxiety disorder, unspecified: Secondary | ICD-10-CM

## 2018-05-25 DIAGNOSIS — F1011 Alcohol abuse, in remission: Secondary | ICD-10-CM

## 2018-05-25 DIAGNOSIS — Z915 Personal history of self-harm: Secondary | ICD-10-CM | POA: Diagnosis not present

## 2018-05-25 DIAGNOSIS — Z87891 Personal history of nicotine dependence: Secondary | ICD-10-CM

## 2018-05-25 DIAGNOSIS — F322 Major depressive disorder, single episode, severe without psychotic features: Secondary | ICD-10-CM

## 2018-05-25 DIAGNOSIS — R450 Nervousness: Secondary | ICD-10-CM | POA: Diagnosis not present

## 2018-05-25 DIAGNOSIS — R45851 Suicidal ideations: Secondary | ICD-10-CM

## 2018-05-25 DIAGNOSIS — R4587 Impulsiveness: Secondary | ICD-10-CM

## 2018-05-25 DIAGNOSIS — Z810 Family history of intellectual disabilities: Secondary | ICD-10-CM

## 2018-05-25 NOTE — BHH Suicide Risk Assessment (Signed)
Suicide Risk Assessment  Discharge Assessment   [facility] Discharge Suicide Risk Assessment   Principal Problem: Major depressive disorder without psychotic features Discharge Diagnoses:  Patient Active Problem List   Diagnosis Date Noted  . Suicidal thoughts [R45.851]   . Dementia with behavioral disturbance [F03.91] 09/01/2015  . Dementia [F03.90]   . Major depressive disorder without psychotic features ([place]) [F32.9] 05/24/2015  . Aggressive behavior [R46.89]   . Chest pain [R07.9] 09/15/2013  . Coronary atherosclerosis of native coronary artery [I25.10] 09/15/2013  . Hypokalemia [E87.6] 08/28/2013  . Acute respiratory failure ([place]) [J96.00] 08/25/2013  . Altered mental status [R41.82] 08/25/2013  . Anoxic brain injury ([place]) [G93.1] 08/25/2013  . HTN (hypertension) [I10] 08/25/2013  . Suicide attempt ([place]) [T14.91XA] 08/25/2013  . Vitamin B 12 deficiency [E53.8] 07/15/2013  . Unspecified hereditary and idiopathic peripheral neuropathy [G60.9] 07/15/2013  . Memory loss [R41.3] 07/15/2013    Total Time spent with patient: 45 minutes  Musculoskeletal: Strength & Muscle Tone: within normal limits Gait & Station: normal Patient leans: N/A  Psychiatric Specialty Exam: Physical Exam  Constitutional: He is oriented to person, place, and time. He appears well-developed and well-nourished.  HENT:  Head: Normocephalic.  Respiratory: Effort normal.  Musculoskeletal: Normal range of motion.  Neurological: He is alert and oriented to person, place, and time.  Psychiatric: His speech is normal and behavior is normal. Thought content normal. Cognition and memory are normal. He expresses impulsivity. He exhibits a depressed mood.   Review of Systems  Psychiatric/Behavioral: Positive for depression. Negative for hallucinations, memory loss, substance abuse and suicidal ideas. The patient is nervous/anxious. The patient does not have insomnia.   All other systems reviewed and are  negative.  Blood pressure 138/70, pulse 66, temperature 98.2 F (36.8 C), temperature source Oral, resp. rate 16, height 6\' 2"  (1.88 m), weight 137 lb 6 oz (62.3 kg), SpO2 98 %.Body mass index is 17.64 kg/m. General Appearance: Casual Eye Contact:  Good Speech:  Clear and Coherent and Normal Rate Volume:  Normal Mood:  Anxious and Depressed Affect:  Congruent and Depressed Thought Process:  Coherent, Goal Directed and Linear Orientation:  Full (Time, Place, and Person) Thought Content:  Logical Suicidal Thoughts:  No Homicidal Thoughts:  No Memory:  Immediate;   Good Recent;   Good Remote;   Fair Judgement:  Fair Insight:  Fair Psychomotor Activity:  Normal Concentration:  Concentration: Good and Attention Span: Good Recall:  Good Fund of Knowledge:  Good Language:  Good Akathisia:  No Handed:  Right AIMS (if indicated):    Assets:  Communication Skills Desire for Improvement Financial Resources/Insurance Housing Social Support ADL's:  Intact Cognition:  WNL Sleep:   Good   Mental Status Per Nursing Assessment::   On Admission:   Suicidal ideation  Demographic Factors:  Male, Age 75 or older and Caucasian  Loss Factors: Decline in physical health  Historical Factors: Impulsivity  Risk Reduction Factors:   Sense of responsibility to family  Continued Clinical Symptoms:  Depression:   Impulsivity  Cognitive Features That Contribute To Risk:  Closed-mindedness    Suicide Risk:  Minimal: No identifiable suicidal ideation.  Patients presenting with no risk factors but with morbid ruminations; may be classified as minimal risk based on the severity of the depressive symptoms    Plan Of Care/Follow-up recommendations:  Activity:  as tolerated Diet:  Heart Healthy  Ethelene Hal, NP 05/25/2018, 11:48 AM

## 2018-05-25 NOTE — BH Assessment (Signed)
[facility] Assessment Progress Note  Per Buford Dresser, DO, this pt does not require psychiatric hospitalization at this time.  Pt is to be discharged from [facility] with recommendation to continue treatment with Dr Para Skeans, his outpatient provider.  This has been included in pt's discharge instructions.  Pt's nurse has been notified.  Jalene Mullet, [place] Triage Specialist [phone]

## 2018-05-25 NOTE — Consult Note (Addendum)
[facility] Consult   Reason for Consult:  Suicidal ideation Referring Physician:  EDP Patient Identification: Anthony Skinner MRN:  [id number] Principal Diagnosis: Major depressive disorder without psychotic features Diagnosis:   Patient Active Problem List   Diagnosis Date Noted  . Suicidal thoughts [R45.851]   . Dementia with behavioral disturbance [F03.91] 09/01/2015  . Dementia [F03.90]   . Major depressive disorder without psychotic features ([place]) [F32.9] 05/24/2015  . Aggressive behavior [R46.89]   . Chest pain [R07.9] 09/15/2013  . Coronary atherosclerosis of native coronary artery [I25.10] 09/15/2013  . Hypokalemia [E87.6] 08/28/2013  . Acute respiratory failure ([place]) [J96.00] 08/25/2013  . Altered mental status [R41.82] 08/25/2013  . Anoxic brain injury ([place]) [G93.1] 08/25/2013  . HTN (hypertension) [I10] 08/25/2013  . Suicide attempt ([place]) [T14.91XA] 08/25/2013  . Vitamin B 12 deficiency [E53.8] 07/15/2013  . Unspecified hereditary and idiopathic peripheral neuropathy [G60.9] 07/15/2013  . Memory loss [R41.3] 07/15/2013    Total Time spent with patient: 45 minutes  Subjective:   Anthony Skinner is a 77 y.o. male patient admitted with suicidal ideation.  HPI:  Pt was seen and chart reviewed with treatment team and Dr Mariea Clonts.  Pt denies suicidal/homicidal ideation, denies auditory/visual hallucinations and does not appear to be responding to internal stimuli. Pt stated he has suicidal thoughts off and on and knows when this happens he needs to come to the ED for stabilization. Pt has a prior history of suicide attempt x 1 by hanging, four years ago. Pt stated he has not attempted to do this again and understands how to keep himself safe. Pt's UDS and BAL were negative. Pt is followed by Dr Griffin Basil for medication management and has an appointment on Wednesday to see him.  Pt was able to contract for safety upon discharge. Pt is stable and psychiatrically clear for  discharge.  Past Psychiatric History: As above  Risk to Self: None Risk to Others: None Prior Inpatient Therapy: Prior Inpatient Therapy: Yes Prior Therapy Dates: 2019, 2018 and other Prior Therapy Facilty/Provider(s): [facility] Reason for Treatment: Depression, SI Prior Outpatient Therapy: Prior Outpatient Therapy: Yes Prior Therapy Dates: Ongoing Prior Therapy Facilty/Provider(s): Dr. Para Skeans Reason for Treatment: Depression Does patient have an ACCT team?: No Does patient have Intensive In-House Services?  : No Does patient have [facility] services? : No Does patient have P4CC services?: No  Past Medical History:  Past Medical History:  Diagnosis Date  . Alzheimer disease   . CAD (coronary artery disease)    a. reported h/o MI in the 30's;  b. 04/2000 Cath: LM nl, LAD 40p, D1 small, nl, RI nl, LCX nl, RCA nl.  . Dementia   . Depression   . DJD (degenerative joint disease)   . Fatty liver   . Gastropathy 2012   reactive  . GERD (gastroesophageal reflux disease)   . Hiatal hernia   . Hyperlipidemia   . Hypertension   . Hypertension   . Iron deficiency anemia   . Myocardial infarction ([place])    " BACK IN THE 90'S"  . Prostate cancer ([place])    a. 09/2008 s/p prostatectomy.  . PUD (peptic ulcer disease)   . Recurrent spontaneous pneumothorax    a. s/p L lobectomy in 1966.  Marland Kitchen Shortness of breath   . Suicide attempt ([place])    a. 08/2013 attempt by hanging with subsequent resp failure  . Syncope    a. in setting of GIB in 2012, presumed to be orthostatic.  Marland Kitchen  Tubular adenoma of colon 2012  . Upper GI bleed    a. 2012  . Vertebral artery stenosis    a. 09/2010 s/p L vertebral stenting 09/2010.    Past Surgical History:  Procedure Laterality Date  . CARDIAC CATHETERIZATION  09/15/2013  . CARDIAC SURGERY    . CATARACT EXTRACTION Right   . LEFT HEART CATHETERIZATION WITH CORONARY ANGIOGRAM N/A 09/15/2013   Procedure: LEFT HEART CATHETERIZATION WITH CORONARY  ANGIOGRAM;  Surgeon: Burnell Blanks, MD;  Location: [facility] C[facility] LAB;  Service: Cardiovascular;  Laterality: N/A;  . LUNG REMOVAL, PARTIAL  1960s   left  . PROSTATECTOMY    . vertebral artery stent     Family History:  Family History  Problem Relation Age of Onset  . Alzheimer's disease Mother   . Heart attack Father   . Prostate cancer Brother    Family Psychiatric  History: Mother-alzheimer's disease.  Social History:  Social History   Substance and Sexual Activity  Alcohol Use No   Comment: Hx heavy EtOH use but quit 2011     Social History   Substance and Sexual Activity  Drug Use No    Social History   Socioeconomic History  . Marital status: Married    Spouse name: Terri Piedra  . Number of children: 0  . Years of education: 12th  . Highest education level: Not on file  Occupational History  . Occupation: retired    Fish farm manager: RETIRED  Social Needs  . Financial resource strain: Not on file  . Food insecurity:    Worry: Not on file    Inability: Not on file  . Transportation needs:    Medical: Not on file    Non-medical: Not on file  Tobacco Use  . Smoking status: Former Smoker    Packs/day: 1.00    Years: 40.00    Pack years: 40.00    Types: Cigarettes    Last attempt to quit: 08/27/2002    Years since quitting: 15.7  . Smokeless tobacco: Never Used  Substance and Sexual Activity  . Alcohol use: No    Comment: Hx heavy EtOH use but quit 2011  . Drug use: No  . Sexual activity: Not Currently  Lifestyle  . Physical activity:    Days per week: Not on file    Minutes per session: Not on file  . Stress: Not on file  Relationships  . Social connections:    Talks on phone: Not on file    Gets together: Not on file    Attends religious service: Not on file    Active member of club or organization: Not on file    Attends meetings of clubs or organizations: Not on file    Relationship status: Not on file  Other Topics Concern  . Not on file  Social  History Narrative   Lives in [place] with wife.  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   Caffeine Use: 4 cups daily       Additional Social History: N/A    Allergies:  No Known Allergies  Labs:  Results for orders placed or performed during the hospital encounter of 05/24/18 (from the past 48 hour(s))  Comprehensive metabolic panel     Status: Abnormal   Collection Time: 05/24/18 10:02 AM  Result Value Ref Range   Sodium 136 135 - 145 mmol/L   Potassium 4.0 3.5 - 5.1 mmol/L   Chloride 99 (L) 101 - 111 mmol/L   CO2 25 22 - 32  mmol/L   Glucose, Bld 93 65 - 99 mg/dL   BUN 13 6 - 20 mg/dL   Creatinine, Ser 1.11 0.61 - 1.24 mg/dL   Calcium 9.3 8.9 - 10.3 mg/dL   Total Protein 8.0 6.5 - 8.1 g/dL   Albumin 4.6 3.5 - 5.0 g/dL   AST 25 15 - 41 U/L   ALT 14 (L) 17 - 63 U/L   Alkaline Phosphatase 68 38 - 126 U/L   Total Bilirubin 1.0 0.3 - 1.2 mg/dL   GFR calc non Af Amer >60 >60 mL/min   GFR calc Af Amer >60 >60 mL/min    Comment: (NOTE) The eGFR has been calculated using the CKD EPI equation. This calculation has not been validated in all clinical situations. eGFR's persistently <60 mL/min signify possible Chronic Kidney Disease.    Anion gap 12 5 - 15    Comment: Performed at [facility], [place] [address] [place], [place] [address]  Ethanol     Status: None   Collection Time: 05/24/18 10:02 AM  Result Value Ref Range   Alcohol, Ethyl (B) <10 <10 mg/dL    Comment: (NOTE) Lowest detectable limit for serum alcohol is 10 mg/dL. For medical purposes only. Performed at [facility], [place] [address] [place], [place] [address]   cbc     Status: None   Collection Time: 05/24/18 10:02 AM  Result Value Ref Range   WBC 6.1 4.0 - 10.5 K/uL   RBC 5.25 4.22 - 5.81 MIL/uL   Hemoglobin 15.0 13.0 - 17.0 g/dL   HCT 44.5 39.0 - 52.0 %   MCV 84.8 78.0 - 100.0 fL   MCH 28.6 26.0 - 34.0 pg   MCHC 33.7 30.0 - 36.0 g/dL   RDW 15.0 11.5 - 15.5  %   Platelets 250 150 - 400 K/uL    Comment: Performed at [facility], [place] [address] [place], [place] [address]  Rapid urine drug screen (hospital performed)     Status: None   Collection Time: 05/24/18 10:45 AM  Result Value Ref Range   Opiates NONE DETECTED NONE DETECTED   Cocaine NONE DETECTED NONE DETECTED   Benzodiazepines NONE DETECTED NONE DETECTED   Amphetamines NONE DETECTED NONE DETECTED   Tetrahydrocannabinol NONE DETECTED NONE DETECTED   Barbiturates NONE DETECTED NONE DETECTED    Comment: (NOTE) DRUG SCREEN FOR MEDICAL PURPOSES ONLY.  IF CONFIRMATION IS NEEDED FOR ANY PURPOSE, NOTIFY LAB WITHIN 5 DAYS. LOWEST DETECTABLE LIMITS FOR URINE DRUG SCREEN Drug Class                     Cutoff (ng/mL) Amphetamine and metabolites    1000 Barbiturate and metabolites    200 Benzodiazepine                 751 Tricyclics and metabolites     300 Opiates and metabolites        300 Cocaine and metabolites        300 THC                            50 Performed at [facility], [place] [address] [place], [place] [address]     Current Facility-Administered Medications  Medication Dose Route Frequency Provider Last Rate Last Dose  . acetaminophen (TYLENOL) tablet 650 mg  650 mg Oral Q4H PRN Jola Schmidt, MD      .  alum & mag hydroxide-simeth (MAALOX/MYLANTA) 200-200-20 MG/5ML suspension 30 mL  30 mL Oral Q6H PRN Jola Schmidt, MD      . ondansetron [facility]) tablet 4 mg  4 mg Oral Q8H PRN Jola Schmidt, MD      . QUEtiapine (SEROQUEL) tablet 100 mg  100 mg Oral QHS Akintayo, Mojeed, MD   100 mg at 05/24/18 2109  . sertraline (ZOLOFT) tablet 100 mg  100 mg Oral Daily Akintayo, Mojeed, MD   100 mg at 05/25/18 1009   Current Outpatient Medications  Medication Sig Dispense Refill  . sertraline (ZOLOFT) 100 MG tablet Take 100 mg by mouth daily.  0  . QUEtiapine (SEROQUEL) 25 MG tablet Take 25 mg by mouth. After breakfast and 25 mg with supper  0     Musculoskeletal: Strength & Muscle Tone: within normal limits Gait & Station: normal Patient leans: N/A  Psychiatric Specialty Exam: Physical Exam  Nursing note and vitals reviewed. Constitutional: He is oriented to person, place, and time. He appears well-developed and well-nourished.  HENT:  Head: Normocephalic and atraumatic.  Neck: Normal range of motion.  Respiratory: Effort normal.  Musculoskeletal: Normal range of motion.  Neurological: He is alert and oriented to person, place, and time.  Psychiatric: His speech is normal and behavior is normal. Thought content normal. Cognition and memory are normal. He expresses impulsivity. He exhibits a depressed mood.    Review of Systems  Psychiatric/Behavioral: Positive for depression. Negative for hallucinations, memory loss, substance abuse and suicidal ideas. The patient is nervous/anxious. The patient does not have insomnia.   All other systems reviewed and are negative.   Blood pressure 138/70, pulse 66, temperature 98.2 F (36.8 C), temperature source Oral, resp. rate 16, height '6\' 2"'$  (1.88 m), weight 137 lb 6 oz (62.3 kg), SpO2 98 %.Body mass index is 17.64 kg/m.  General Appearance: Casual  Eye Contact:  Good  Speech:  Clear and Coherent and Normal Rate  Volume:  Normal  Mood:  Anxious and Depressed  Affect:  Congruent and Depressed  Thought Process:  Coherent, Goal Directed and Linear  Orientation:  Full (Time, Place, and Person)  Thought Content:  Logical  Suicidal Thoughts:  No  Homicidal Thoughts:  No  Memory:  Immediate;   Good Recent;   Good Remote;   Fair  Judgement:  Fair  Insight:  Fair  Psychomotor Activity:  Normal  Concentration:  Concentration: Good and Attention Span: Good  Recall:  Good  Fund of Knowledge:  Good  Language:  Good  Akathisia:  No  Handed:  Right  AIMS (if indicated):   N/A  Assets:  Communication Skills Desire for Improvement Financial Resources/Insurance Housing Social  Support  ADL's:  Intact  Cognition:  WNL  Sleep:   Good     Treatment Plan Summary: Plan Major depressive disorder without psychotic features  Discharge Home Take all medications as prescribed. Discussed with patient can increase Zoloft for depression. Can follow up with outpatient provider on Wednesday for medication management.   Disposition: No evidence of imminent risk to self or others at present.   Patient does not meet criteria for psychiatric inpatient admission. Supportive therapy provided about ongoing stressors. Discussed crisis plan, support from social network, calling 911, coming to the Emergency Department, and calling Suicide Hotline.  Ethelene Hal, NP 05/25/2018 11:48 AM   Patient seen face-to-face for psychiatric evaluation, chart reviewed and case discussed with the physician extender and developed treatment plan. Reviewed the information documented and  agree with the treatment plan.  Buford Dresser, DO 05/25/18 5:20 PM

## 2018-05-25 NOTE — Discharge Instructions (Signed)
For your behavioral health needs, you are advised to continue treatment with Dr Para Skeans.

## 2018-05-25 NOTE — ED Notes (Signed)
TTS AT BEDSIDE 

## 2018-06-08 ENCOUNTER — Encounter (HOSPITAL_COMMUNITY): Payer: Self-pay | Admitting: Emergency Medicine

## 2018-06-08 ENCOUNTER — Emergency Department (HOSPITAL_COMMUNITY)
Admission: EM | Admit: 2018-06-08 | Discharge: 2018-06-08 | Disposition: A | Discharge status: Home / Self Care | Payer: Medicare Other | Attending: Emergency Medicine | Admitting: Emergency Medicine

## 2018-06-08 ENCOUNTER — Other Ambulatory Visit: Payer: Self-pay

## 2018-06-08 DIAGNOSIS — F0391 Unspecified dementia with behavioral disturbance: Secondary | ICD-10-CM

## 2018-06-08 DIAGNOSIS — Z87891 Personal history of nicotine dependence: Secondary | ICD-10-CM | POA: Diagnosis not present

## 2018-06-08 DIAGNOSIS — R4689 Other symptoms and signs involving appearance and behavior: Secondary | ICD-10-CM | POA: Diagnosis present

## 2018-06-08 DIAGNOSIS — I10 Essential (primary) hypertension: Secondary | ICD-10-CM | POA: Diagnosis not present

## 2018-06-08 DIAGNOSIS — I259 Chronic ischemic heart disease, unspecified: Secondary | ICD-10-CM | POA: Insufficient documentation

## 2018-06-08 DIAGNOSIS — G309 Alzheimer's disease, unspecified: Secondary | ICD-10-CM | POA: Diagnosis not present

## 2018-06-08 DIAGNOSIS — F0281 Dementia in other diseases classified elsewhere with behavioral disturbance: Secondary | ICD-10-CM | POA: Diagnosis not present

## 2018-06-08 NOTE — ED Notes (Signed)
Please call Wife @ [phone] When moved from room 26.

## 2018-06-08 NOTE — ED Notes (Signed)
Bed: WLPT4 Expected date:  Expected time:  Means of arrival:  Comments: 

## 2018-06-08 NOTE — Progress Notes (Signed)
CSW consulted by MD asking for assistance with getting pt further services for needs. CSW aware that pt has slight dementia as well. CSW spoke with MD and expressed that pt would have to pay out of pocket for placement as pt has not had a three night qualifying inpt stay for Medicare to pay for this. CSW spoke with [facility] to see if pt was appropriate for Home Health needs. CSW informed that pt may not have a skillable need which would impact the need for home health services. CSW attempted to speak with wife to discuss options with her however CSW left voicemail for her.   CSW will fax over Memory Care?ALF information to [place] [facility] for pt at this time. CSW will follow in the event that wife calls CSW back.   Virgie Dad Raidyn Wassink, MSW, [facility] Clinical Social Worker [phone]

## 2018-06-08 NOTE — ED Notes (Signed)
Bed: WA26 Expected date:  Expected time:  Means of arrival:  Comments: 

## 2018-06-08 NOTE — Progress Notes (Signed)
CSW spoke with pt's wife and was informed that she is planning to pick pt up at 2:30pm today. CSW informed wife that CSW has faxed over resources to [facility] ED and those instructions should be attached to discharge summary. Wife expressed that sh would follow up with ALF/MEmory Care resources for pt. At this time there are no further CSW needs. CSW will sign off.   Virgie Dad. Torben Soloway, MSW, [place] [facility] Clinical Social Worker [phone]

## 2018-06-08 NOTE — ED Provider Notes (Signed)
[facility] DEPT Provider Note   CSN: [id number] Arrival date & time: 06/08/18  4709     History   Chief Complaint Chief Complaint  Patient presents with  . Aggressive Behavior    HPI Anthony Skinner is a 77 y.o. male.  77 year old male with extensive past medical history below including Alzheimer's dementia who presents with aggressive behavior.  Patient states that his wife dropped him off to the ED this morning because he was "getting a Carla Rashad rowdy" and he thinks that he got loud.  He denies hitting or threatening her.  He states that he may need some living assistance because he thinks that his wife cannot handle him due to her own medical conditions.  He reports he sometimes has suicidal thoughts but states that he has not had them today.  He denies any plans to hurt himself.  He has distant history of suicide attempt.  He denies any HI or hallucinations.  No alcohol or drug abuse.  LEVEL5 CAVEAT DUE TO DEMENTIA  The history is provided by the patient and the spouse.    Past Medical History:  Diagnosis Date  . Alzheimer disease   . CAD (coronary artery disease)    a. reported h/o MI in the 62's;  b. 04/2000 Cath: LM nl, LAD 40p, D1 small, nl, RI nl, LCX nl, RCA nl.  . Dementia   . Depression   . DJD (degenerative joint disease)   . Fatty liver   . Gastropathy 2012   reactive  . GERD (gastroesophageal reflux disease)   . Hiatal hernia   . Hyperlipidemia   . Hypertension   . Hypertension   . Iron deficiency anemia   . Myocardial infarction ([place])    " BACK IN THE 90'S"  . Prostate cancer ([place])    a. 09/2008 s/p prostatectomy.  . PUD (peptic ulcer disease)   . Recurrent spontaneous pneumothorax    a. s/p L lobectomy in 1966.  Marland Kitchen Shortness of breath   . Suicide attempt ([place])    a. 08/2013 attempt by hanging with subsequent resp failure  . Syncope    a. in setting of GIB in 2012, presumed to be orthostatic.  . Tubular adenoma of colon  2012  . Upper GI bleed    a. 2012  . Vertebral artery stenosis    a. 09/2010 s/p L vertebral stenting 09/2010.    Patient Active Problem List   Diagnosis Date Noted  . Suicidal thoughts   . Dementia with behavioral disturbance 09/01/2015  . Dementia   . Major depressive disorder without psychotic features ([place]) 05/24/2015  . Aggressive behavior   . Chest pain 09/15/2013  . Coronary atherosclerosis of native coronary artery 09/15/2013  . Hypokalemia 08/28/2013  . Acute respiratory failure ([place]) 08/25/2013  . Altered mental status 08/25/2013  . Anoxic brain injury ([place]) 08/25/2013  . HTN (hypertension) 08/25/2013  . Suicide attempt ([place]) 08/25/2013  . Vitamin B 12 deficiency 07/15/2013  . Unspecified hereditary and idiopathic peripheral neuropathy 07/15/2013  . Memory loss 07/15/2013    Past Surgical History:  Procedure Laterality Date  . CARDIAC CATHETERIZATION  09/15/2013  . CARDIAC SURGERY    . CATARACT EXTRACTION Right   . LEFT HEART CATHETERIZATION WITH CORONARY ANGIOGRAM N/A 09/15/2013   Procedure: LEFT HEART CATHETERIZATION WITH CORONARY ANGIOGRAM;  Surgeon: Burnell Blanks, MD;  Location: [facility] CATH LAB;  Service: Cardiovascular;  Laterality: N/A;  . LUNG REMOVAL, PARTIAL  1960s   left  .  PROSTATECTOMY    . vertebral artery stent          Home Medications    Prior to Admission medications   Medication Sig Start Date End Date Taking? Authorizing Provider  sertraline (ZOLOFT) 50 MG tablet Take 50 mg by mouth daily.   Yes [provider]    Family History Family History  Problem Relation Age of Onset  . Alzheimer's disease Mother   . Heart attack Father   . Prostate cancer Brother     Social History Social History   Tobacco Use  . Smoking status: Former Smoker    Packs/day: 1.00    Years: 40.00    Pack years: 40.00    Types: Cigarettes    Last attempt to quit: 08/27/2002    Years since quitting: 15.7  . Smokeless tobacco: Never Used    Substance Use Topics  . Alcohol use: No    Comment: Hx heavy EtOH use but quit 2011  . Drug use: No     Allergies   Patient has no known allergies.   Review of Systems Review of Systems  Unable to perform ROS: Dementia     Physical Exam Updated Vital Signs BP (!) 142/76 (BP Location: Right Arm)   Pulse 60   Temp 98.2 F (36.8 C) (Oral)   Resp 17   SpO2 99%   Physical Exam  Constitutional: He is oriented to person, place, and time. He appears well-developed and well-nourished. No distress.  HENT:  Head: Normocephalic and atraumatic.  Eyes: Conjunctivae are normal.  Neck: Neck supple.  Cardiovascular: Normal rate, regular rhythm and normal heart sounds.  Pulmonary/Chest: Breath sounds normal. No respiratory distress.  Neurological: He is alert and oriented to person, place, and time.  Normal gait  Skin: Skin is warm and dry.  Psychiatric:  Pleasant, calm, polite, appropriate eye contact  Nursing note and vitals reviewed.    ED Treatments / Results  Labs (all labs ordered are listed, but only abnormal results are displayed) Labs Reviewed - No data to display  EKG None  Radiology No results found.  Procedures Procedures (including critical care time)  Medications Ordered in ED Medications - No data to display   Initial Impression / Assessment and Plan / ED Course  I have reviewed the triage vital signs and the nursing notes.       He was calm, cooperative, and conversant on my exam. No combativeness or aggression in the ED.  I reviewed his chart and he has had multiple previous presentations for similar symptoms.  Unfortunately, I feel that his symptoms are likely related to progression of his dementia.  He has no concerning findings on physical exam today and I do not feel that he requires any acute psychiatric care.  I contacted social work and case management, all 3 of [place] called the patient's wife and discussed over the phone.  Provided her with  outpatient resources but unfortunately we are not able to place the patient directly into rehab or assisted living from the ER.  I recommended that she follow-up with PCP and pursue some of the options provided by case management.  She voiced understanding.  Final Clinical Impressions(s) / ED Diagnoses   Final diagnoses:  Dementia with behavioral disturbance, unspecified dementia type    ED Discharge Orders    None       Damian Hofstra, Wenda Overland, MD 06/08/18 1713

## 2018-06-08 NOTE — ED Notes (Signed)
ED Provider at bedside. 

## 2018-06-08 NOTE — ED Triage Notes (Signed)
Pt reports that his wife dropped him off here this morning do to him "getting little rowdy this morning". Pt states that he did get a little loud this morning but denies hitting or threatening anyone. Denies SI or HI. Pt has early stages of dementia and seen recently here for SI.

## 2018-06-29 ENCOUNTER — Emergency Department (HOSPITAL_COMMUNITY): Payer: Medicare Other

## 2018-06-29 ENCOUNTER — Emergency Department (HOSPITAL_COMMUNITY)
Admission: EM | Admit: 2018-06-29 | Discharge: 2018-06-29 | Disposition: A | Discharge status: Home / Self Care | Payer: Medicare Other | Attending: Emergency Medicine | Admitting: Emergency Medicine

## 2018-06-29 ENCOUNTER — Other Ambulatory Visit: Payer: Self-pay

## 2018-06-29 ENCOUNTER — Encounter (HOSPITAL_COMMUNITY): Payer: Self-pay | Admitting: Emergency Medicine

## 2018-06-29 DIAGNOSIS — I10 Essential (primary) hypertension: Secondary | ICD-10-CM | POA: Diagnosis not present

## 2018-06-29 DIAGNOSIS — F0280 Dementia in other diseases classified elsewhere without behavioral disturbance: Secondary | ICD-10-CM | POA: Diagnosis not present

## 2018-06-29 DIAGNOSIS — Z79899 Other long term (current) drug therapy: Secondary | ICD-10-CM | POA: Insufficient documentation

## 2018-06-29 DIAGNOSIS — G309 Alzheimer's disease, unspecified: Secondary | ICD-10-CM | POA: Diagnosis not present

## 2018-06-29 DIAGNOSIS — I2510 Atherosclerotic heart disease of native coronary artery without angina pectoris: Secondary | ICD-10-CM | POA: Diagnosis not present

## 2018-06-29 DIAGNOSIS — R634 Abnormal weight loss: Secondary | ICD-10-CM | POA: Diagnosis present

## 2018-06-29 DIAGNOSIS — Z87891 Personal history of nicotine dependence: Secondary | ICD-10-CM | POA: Insufficient documentation

## 2018-06-29 LAB — CBC WITH DIFFERENTIAL/PLATELET
Basophils Absolute: 0.1 10*3/uL (ref 0.0–0.1)
Basophils Relative: 1 %
Eosinophils Absolute: 0.2 10*3/uL (ref 0.0–0.7)
Eosinophils Relative: 3 %
HCT: 43.5 % (ref 39.0–52.0)
Hemoglobin: 14.4 g/dL (ref 13.0–17.0)
Lymphocytes Relative: 31 %
Lymphs Abs: 1.7 10*3/uL (ref 0.7–4.0)
MCH: 28.1 pg (ref 26.0–34.0)
MCHC: 33.1 g/dL (ref 30.0–36.0)
MCV: 85 fL (ref 78.0–100.0)
Monocytes Absolute: 0.5 10*3/uL (ref 0.1–1.0)
Monocytes Relative: 9 %
Neutro Abs: 2.9 10*3/uL (ref 1.7–7.7)
Neutrophils Relative %: 56 %
Platelets: 250 10*3/uL (ref 150–400)
RBC: 5.12 MIL/uL (ref 4.22–5.81)
RDW: 15.4 % (ref 11.5–15.5)
WBC: 5.3 10*3/uL (ref 4.0–10.5)

## 2018-06-29 LAB — URINALYSIS, ROUTINE W REFLEX MICROSCOPIC
Bilirubin Urine: NEGATIVE
Glucose, UA: NEGATIVE mg/dL
Hgb urine dipstick: NEGATIVE
Ketones, ur: NEGATIVE mg/dL
Leukocytes, UA: NEGATIVE
Nitrite: NEGATIVE
Protein, ur: NEGATIVE mg/dL
Specific Gravity, Urine: 1.005 (ref 1.005–1.030)
pH: 7 (ref 5.0–8.0)

## 2018-06-29 LAB — COMPREHENSIVE METABOLIC PANEL
ALT: 13 U/L (ref 0–44)
AST: 21 U/L (ref 15–41)
Albumin: 3.8 g/dL (ref 3.5–5.0)
Alkaline Phosphatase: 55 U/L (ref 38–126)
Anion gap: 9 (ref 5–15)
BUN: 18 mg/dL (ref 8–23)
CO2: 26 mmol/L (ref 22–32)
Calcium: 9.1 mg/dL (ref 8.9–10.3)
Chloride: 103 mmol/L (ref 98–111)
Creatinine, Ser: 1.21 mg/dL (ref 0.61–1.24)
GFR calc Af Amer: >60 mL/min (ref >60)
GFR calc non Af Amer: 57 mL/min — ABNORMAL LOW (ref >60)
Glucose, Bld: 90 mg/dL (ref 70–99)
Potassium: 4.2 mmol/L (ref 3.5–5.1)
Sodium: 138 mmol/L (ref 135–145)
Total Bilirubin: 0.8 mg/dL (ref 0.3–1.2)
Total Protein: 7 g/dL (ref 6.5–8.1)

## 2018-06-29 LAB — TSH: TSH: 3.312 u[IU]/mL (ref 0.350–4.500)

## 2018-06-29 NOTE — ED Provider Notes (Addendum)
[facility] DEPT Provider Note   CSN: [id number] Arrival date & time: 06/29/18  0448  Time seen 05:15 AM   History   Chief Complaint Chief Complaint  Patient presents with  . Weight Loss    HPI Anthony Skinner is a 77 y.o. male.  HPI patient states he was asleep and his wife woke him up and told him to get dressed because she was going to call 911 to take him to the hospital.  He does not know why she called 911.  Police report when they got there the wife was in her 78 and the patient was sitting in a chair in the living room drinking coffee and was dressed.  She called for "mental" and states the patient will not sleep.  He denies any arguing or yelling at his wife today.  Patient states his wife told him he has dementia and that he is "out of it".  He states he wants to have an evaluation and know for sure if he has dementia.  Patient is fully alert and oriented and can tell me the year, month, and who the president is.  He is alert and cooperative in the emergency department.  He states he does not feel sick but he is concerned about weight loss.  He states he eats and drinks well.  He states he has weighed up to 180 pounds and today he only weight 134 pounds which is the lowest he is ever weighed.  He states he does not feel sick.  He states he used to be a smoker but he quit years ago.  He states he has a psychiatrist but he does not have a neurologist.  He states his mother and his brother had Alzheimer's.  PCP Charolette Forward, MD   Past Medical History:  Diagnosis Date  . Alzheimer disease   . CAD (coronary artery disease)    a. reported h/o MI in the 80's;  b. 04/2000 Cath: LM nl, LAD 40p, D1 small, nl, RI nl, LCX nl, RCA nl.  . Dementia   . Depression   . DJD (degenerative joint disease)   . Fatty liver   . Gastropathy 2012   reactive  . GERD (gastroesophageal reflux disease)   . Hiatal hernia   . Hyperlipidemia   . Hypertension   .  Hypertension   . Iron deficiency anemia   . Myocardial infarction ([place])    " BACK IN THE 90'S"  . Prostate cancer ([place])    a. 09/2008 s/p prostatectomy.  . PUD (peptic ulcer disease)   . Recurrent spontaneous pneumothorax    a. s/p L lobectomy in 1966.  Marland Kitchen Shortness of breath   . Suicide attempt ([place])    a. 08/2013 attempt by hanging with subsequent resp failure  . Syncope    a. in setting of GIB in 2012, presumed to be orthostatic.  . Tubular adenoma of colon 2012  . Upper GI bleed    a. 2012  . Vertebral artery stenosis    a. 09/2010 s/p L vertebral stenting 09/2010.    Patient Active Problem List   Diagnosis Date Noted  . Suicidal thoughts   . Dementia with behavioral disturbance 09/01/2015  . Dementia   . Major depressive disorder without psychotic features ([place]) 05/24/2015  . Aggressive behavior   . Chest pain 09/15/2013  . Coronary atherosclerosis of native coronary artery 09/15/2013  . Hypokalemia 08/28/2013  . Acute respiratory failure ([place]) 08/25/2013  .  Altered mental status 08/25/2013  . Anoxic brain injury ([place]) 08/25/2013  . HTN (hypertension) 08/25/2013  . Suicide attempt ([place]) 08/25/2013  . Vitamin B 12 deficiency 07/15/2013  . Unspecified hereditary and idiopathic peripheral neuropathy 07/15/2013  . Memory loss 07/15/2013    Past Surgical History:  Procedure Laterality Date  . CARDIAC CATHETERIZATION  09/15/2013  . CARDIAC SURGERY    . CATARACT EXTRACTION Right   . LEFT HEART CATHETERIZATION WITH CORONARY ANGIOGRAM N/A 09/15/2013   Procedure: LEFT HEART CATHETERIZATION WITH CORONARY ANGIOGRAM;  Surgeon: Burnell Blanks, MD;  Location: [facility] CATH LAB;  Service: Cardiovascular;  Laterality: N/A;  . LUNG REMOVAL, PARTIAL  1960s   left  . PROSTATECTOMY    . vertebral artery stent          Home Medications    Prior to Admission medications   Medication Sig Start Date End Date Taking? Authorizing Provider  sertraline (ZOLOFT) 50 MG tablet Take  50 mg by mouth daily.    [provider]    Family History Family History  Problem Relation Age of Onset  . Alzheimer's disease Mother   . Heart attack Father   . Prostate cancer Brother     Social History Social History   Tobacco Use  . Smoking status: Former Smoker    Packs/day: 1.00    Years: 40.00    Pack years: 40.00    Types: Cigarettes    Last attempt to quit: 08/27/2002    Years since quitting: 15.8  . Smokeless tobacco: Never Used  Substance Use Topics  . Alcohol use: No    Comment: Hx heavy EtOH use but quit 2011  . Drug use: No  lives at home Lives with spouse   Allergies   Patient has no known allergies.   Review of Systems Review of Systems  All other systems reviewed and are negative.    Physical Exam Updated Vital Signs BP (!) 144/78 (BP Location: Left Arm)   Pulse 61   Temp 98 F (36.7 C) (Oral)   Resp 18   Ht 6' (1.829 m)   Wt 60.8 kg (134 lb)   SpO2 100%   BMI 18.17 kg/m   Vital signs normal    Physical Exam  Constitutional: He is oriented to person, place, and time.  Non-toxic appearance. He does not appear ill. No distress.  Thin elderly male who is fully dressed, he is clean and neat.  He is fully cooperative.  HENT:  Head: Normocephalic and atraumatic.  Right Ear: External ear normal.  Left Ear: External ear normal.  Nose: Nose normal. No mucosal edema or rhinorrhea.  Mouth/Throat: Oropharynx is clear and moist and mucous membranes are normal. No dental abscesses or uvula swelling.  Eyes: Pupils are equal, round, and reactive to light. Conjunctivae and EOM are normal.  Neck: Normal range of motion and full passive range of motion without pain. Neck supple.  Cardiovascular: Normal rate, regular rhythm and normal heart sounds. Exam reveals no gallop and no friction rub.  No murmur heard. Pulmonary/Chest: Effort normal and breath sounds normal. No respiratory distress. He has no wheezes. He has no rhonchi. He has no  rales. He exhibits no tenderness and no crepitus.  Abdominal: Soft. Normal appearance and bowel sounds are normal. He exhibits no distension. There is no tenderness. There is no rebound and no guarding.  Musculoskeletal: Normal range of motion. He exhibits no edema or tenderness.  Moves all extremities well.   Neurological: He is  alert and oriented to person, place, and time. He has normal strength. No cranial nerve deficit.  Skin: Skin is warm, dry and intact. No rash noted. No erythema. No pallor.  Psychiatric: He has a normal mood and affect. His speech is normal and behavior is normal. His mood appears not anxious.  Nursing note and vitals reviewed.    ED Treatments / Results  Labs (all labs ordered are listed, but only abnormal results are displayed)  Results for orders placed or performed during the hospital encounter of 06/29/18  Comprehensive metabolic panel  Result Value Ref Range   Sodium 138 135 - 145 mmol/L   Potassium 4.2 3.5 - 5.1 mmol/L   Chloride 103 98 - 111 mmol/L   CO2 26 22 - 32 mmol/L   Glucose, Bld 90 70 - 99 mg/dL   BUN 18 8 - 23 mg/dL   Creatinine, Ser 1.21 0.61 - 1.24 mg/dL   Calcium 9.1 8.9 - 10.3 mg/dL   Total Protein 7.0 6.5 - 8.1 g/dL   Albumin 3.8 3.5 - 5.0 g/dL   AST 21 15 - 41 U/L   ALT 13 0 - 44 U/L   Alkaline Phosphatase 55 38 - 126 U/L   Total Bilirubin 0.8 0.3 - 1.2 mg/dL   GFR calc non Af Amer 57 (L) >60 mL/min   GFR calc Af Amer >60 >60 mL/min   Anion gap 9 5 - 15  CBC with Differential  Result Value Ref Range   WBC 5.3 4.0 - 10.5 K/uL   RBC 5.12 4.22 - 5.81 MIL/uL   Hemoglobin 14.4 13.0 - 17.0 g/dL   HCT 43.5 39.0 - 52.0 %   MCV 85.0 78.0 - 100.0 fL   MCH 28.1 26.0 - 34.0 pg   MCHC 33.1 30.0 - 36.0 g/dL   RDW 15.4 11.5 - 15.5 %   Platelets 250 150 - 400 K/uL   Neutrophils Relative % 56 %   Neutro Abs 2.9 1.7 - 7.7 K/uL   Lymphocytes Relative 31 %   Lymphs Abs 1.7 0.7 - 4.0 K/uL   Monocytes Relative 9 %   Monocytes Absolute 0.5  0.1 - 1.0 K/uL   Eosinophils Relative 3 %   Eosinophils Absolute 0.2 0.0 - 0.7 K/uL   Basophils Relative 1 %   Basophils Absolute 0.1 0.0 - 0.1 K/uL  Urinalysis, Routine w reflex microscopic  Result Value Ref Range   Color, Urine YELLOW YELLOW   APPearance CLEAR CLEAR   Specific Gravity, Urine 1.005 1.005 - 1.030   pH 7.0 5.0 - 8.0   Glucose, UA NEGATIVE NEGATIVE mg/dL   Hgb urine dipstick NEGATIVE NEGATIVE   Bilirubin Urine NEGATIVE NEGATIVE   Ketones, ur NEGATIVE NEGATIVE mg/dL   Protein, ur NEGATIVE NEGATIVE mg/dL   Nitrite NEGATIVE NEGATIVE   Leukocytes, UA NEGATIVE NEGATIVE  TSH  Result Value Ref Range   TSH 3.312 0.350 - 4.500 uIU/mL   Laboratory interpretation all normal    EKG None  Radiology Dg Chest 2 View  Result Date: 06/29/2018 CLINICAL DATA:  Weight loss EXAM: CHEST - 2 VIEW COMPARISON:  Chest radiograph 05/24/2018 FINDINGS: The lungs are hyperinflated with diffuse interstitial prominence. No focal airspace consolidation or pulmonary edema. Calcifications in the right lung are unchanged. No pleural effusion or pneumothorax. Normal cardiomediastinal contours. IMPRESSION: COPD without acute airspace disease. Unchanged chest radiograph compared to 05/24/2018. Electronically Signed   By: Ulyses Jarred M.D.   On: 06/29/2018 06:49    Procedures Procedures (  including critical care time)  Medications Ordered in ED Medications - No data to display   Initial Impression / Assessment and Plan / ED Course  I have reviewed the triage vital signs and the nursing notes.  Pertinent labs & imaging results that were available during my care of the patient were reviewed by me and considered in my medical decision making (see chart for details).     Although patient has a history of Alzheimer's dementia he is fully alert and oriented, he is currently cooperative.  He denies suicidal homicidal ideation.  He seems concerned about his weight loss.  His wife told the police he  was not sleeping however he states she woke him up and had him get dressed to come to the ED.  Patient has been fully cooperative in the ED.  Chest x-ray did not show any obvious sign of lung cancer, his basic lab work including a TSH is normal.  He was discharged home.  He can follow-up with his primary care doctor about his concern about his weight loss.  His wife can get him an appointment at the 1 of the neurology offices to evaluate for his dementia.  He should follow-up with his psychiatrist about his medications.  Final Clinical Impressions(s) / ED Diagnoses   Final diagnoses:  Weight loss    ED Discharge Orders    None      Plan discharge  Rolland Porter, MD, Barbette Or, MD 06/29/18 [place], [place], MD 06/29/18 [phone]

## 2018-06-29 NOTE — Discharge Instructions (Addendum)
Please see your primary care doctor about your concerns about losing weight. You can call one of the neurology offices to get an appointment to evaluate your for dementia.

## 2018-06-29 NOTE — ED Notes (Signed)
Urine culture sent down with UA. 

## 2018-06-29 NOTE — ED Triage Notes (Signed)
Pt reports that he wants help for his alzheimer's disease. Pt reports having increasing weight loss. Pt currently denies any SI/HI. Pt currently alert and oriented x 4.

## 2018-07-27 ENCOUNTER — Encounter (HOSPITAL_COMMUNITY): Payer: Self-pay | Admitting: Emergency Medicine

## 2018-07-27 ENCOUNTER — Emergency Department (HOSPITAL_COMMUNITY): Payer: Medicare Other

## 2018-07-27 ENCOUNTER — Other Ambulatory Visit: Payer: Self-pay

## 2018-07-27 ENCOUNTER — Emergency Department (HOSPITAL_COMMUNITY)
Admission: EM | Admit: 2018-07-27 | Discharge: 2018-07-28 | Disposition: A | Discharge status: Home / Self Care | Payer: Medicare Other | Attending: Emergency Medicine | Admitting: Emergency Medicine

## 2018-07-27 DIAGNOSIS — Z8546 Personal history of malignant neoplasm of prostate: Secondary | ICD-10-CM | POA: Diagnosis not present

## 2018-07-27 DIAGNOSIS — Z87891 Personal history of nicotine dependence: Secondary | ICD-10-CM | POA: Diagnosis not present

## 2018-07-27 DIAGNOSIS — Z79899 Other long term (current) drug therapy: Secondary | ICD-10-CM | POA: Diagnosis not present

## 2018-07-27 DIAGNOSIS — E785 Hyperlipidemia, unspecified: Secondary | ICD-10-CM | POA: Diagnosis not present

## 2018-07-27 DIAGNOSIS — I2510 Atherosclerotic heart disease of native coronary artery without angina pectoris: Secondary | ICD-10-CM | POA: Diagnosis not present

## 2018-07-27 DIAGNOSIS — I252 Old myocardial infarction: Secondary | ICD-10-CM | POA: Diagnosis not present

## 2018-07-27 DIAGNOSIS — F329 Major depressive disorder, single episode, unspecified: Secondary | ICD-10-CM | POA: Insufficient documentation

## 2018-07-27 DIAGNOSIS — F0391 Unspecified dementia with behavioral disturbance: Secondary | ICD-10-CM | POA: Insufficient documentation

## 2018-07-27 DIAGNOSIS — I10 Essential (primary) hypertension: Secondary | ICD-10-CM | POA: Insufficient documentation

## 2018-07-27 DIAGNOSIS — R451 Restlessness and agitation: Secondary | ICD-10-CM | POA: Diagnosis present

## 2018-07-27 LAB — COMPREHENSIVE METABOLIC PANEL
ALT: 11 U/L (ref 0–44)
AST: 18 U/L (ref 15–41)
Albumin: 4.1 g/dL (ref 3.5–5.0)
Alkaline Phosphatase: 59 U/L (ref 38–126)
Anion gap: 8 (ref 5–15)
BUN: 13 mg/dL (ref 8–23)
CO2: 27 mmol/L (ref 22–32)
Calcium: 9.3 mg/dL (ref 8.9–10.3)
Chloride: 103 mmol/L (ref 98–111)
Creatinine, Ser: 1.23 mg/dL (ref 0.61–1.24)
GFR calc Af Amer: >60 mL/min (ref >60)
GFR calc non Af Amer: 56 mL/min — ABNORMAL LOW (ref >60)
Glucose, Bld: 99 mg/dL (ref 70–99)
Potassium: 3.8 mmol/L (ref 3.5–5.1)
Sodium: 138 mmol/L (ref 135–145)
Total Bilirubin: 0.9 mg/dL (ref 0.3–1.2)
Total Protein: 7.5 g/dL (ref 6.5–8.1)

## 2018-07-27 LAB — URINALYSIS, ROUTINE W REFLEX MICROSCOPIC
Bilirubin Urine: NEGATIVE
Glucose, UA: NEGATIVE mg/dL
Hgb urine dipstick: NEGATIVE
Ketones, ur: NEGATIVE mg/dL
Leukocytes, UA: NEGATIVE
Nitrite: NEGATIVE
Protein, ur: NEGATIVE mg/dL
Specific Gravity, Urine: 1.014 (ref 1.005–1.030)
pH: 5 (ref 5.0–8.0)

## 2018-07-27 LAB — CBC WITH DIFFERENTIAL/PLATELET
Basophils Absolute: 0.1 10*3/uL (ref 0.0–0.1)
Basophils Relative: 2 %
Eosinophils Absolute: 0.2 10*3/uL (ref 0.0–0.7)
Eosinophils Relative: 5 %
HCT: 43.4 % (ref 39.0–52.0)
Hemoglobin: 14.5 g/dL (ref 13.0–17.0)
Lymphocytes Relative: 35 %
Lymphs Abs: 1.3 10*3/uL (ref 0.7–4.0)
MCH: 28.2 pg (ref 26.0–34.0)
MCHC: 33.4 g/dL (ref 30.0–36.0)
MCV: 84.3 fL (ref 78.0–100.0)
Monocytes Absolute: 0.4 10*3/uL (ref 0.1–1.0)
Monocytes Relative: 10 %
Neutro Abs: 1.8 10*3/uL (ref 1.7–7.7)
Neutrophils Relative %: 48 %
Platelets: 248 10*3/uL (ref 150–400)
RBC: 5.15 MIL/uL (ref 4.22–5.81)
RDW: 15.4 % (ref 11.5–15.5)
WBC: 3.8 10*3/uL — ABNORMAL LOW (ref 4.0–10.5)

## 2018-07-27 LAB — ETHANOL: Alcohol, Ethyl (B): <10 mg/dL (ref <10)

## 2018-07-27 MED ORDER — SERTRALINE HCL 50 MG PO TABS
50.0000 mg | ORAL_TABLET | Freq: Every day | ORAL | Status: DC
  Administered 2018-07-27 – 2018-07-28 (×2): 50 mg via ORAL
  Filled 2018-07-27 (×2): qty 1

## 2018-07-27 MED ORDER — ACETAMINOPHEN 325 MG PO TABS: 650.0000 mg | ORAL_TABLET | ORAL | Status: DC | PRN

## 2018-07-27 MED ORDER — ONDANSETRON HCL 4 MG PO TABS: 4.0000 mg | ORAL_TABLET | Freq: Three times a day (TID) | ORAL | Status: DC | PRN

## 2018-07-27 MED ORDER — ALUM & MAG HYDROXIDE-SIMETH 200-200-20 MG/5ML PO SUSP: 30.0000 mL | Freq: Four times a day (QID) | ORAL | Status: DC | PRN

## 2018-07-27 MED ORDER — QUETIAPINE FUMARATE 25 MG PO TABS
25.0000 mg | ORAL_TABLET | Freq: Every day | ORAL | Status: DC
  Administered 2018-07-27: 25 mg via ORAL
  Filled 2018-07-27: qty 1

## 2018-07-27 NOTE — BH Assessment (Signed)
[facility] Assessment Progress Note  Case was staffed with Mariea Clonts DO who recommended patient be observed and monitored for safety.

## 2018-07-27 NOTE — ED Provider Notes (Signed)
[facility] DEPT Provider Note   CSN: [id number] Arrival date & time: 07/27/18  0715     History   Chief Complaint Chief Complaint  Patient presents with  . Aggressive Behavior  . Back Pain    HPI Anthony Skinner is a 77 y.o. male.  HPI   77 yo M with h/o prostate CA, dementia, HTN, HLD, here with ? Behavioral disturbance.  Patient has a history of dementia, limiting history.  Patient states that he was not being aggressive or agitated this morning.  He states he was diagnosed with Alzheimer's but does not believe he has it.  He is unsure what happened this morning.  He does state that he has had some intermittent right lower back/flank pain.  The pain is worse when he turns to his left.  This is been an ongoing issue.  No lower extremity weakness or numbness.  No fevers chills.  No cough.  No urinary symptoms.  No other complaints.  Pain is aching, throbbing, worse with palpation and movement.  Level 5 caveat invoked as remainder of history, ROS, and physical exam limited due to patient's dementia.   Past Medical History:  Diagnosis Date  . Alzheimer disease   . CAD (coronary artery disease)    a. reported h/o MI in the 24's;  b. 04/2000 Cath: LM nl, LAD 40p, D1 small, nl, RI nl, LCX nl, RCA nl.  . Dementia   . Depression   . DJD (degenerative joint disease)   . Fatty liver   . Gastropathy 2012   reactive  . GERD (gastroesophageal reflux disease)   . Hiatal hernia   . Hyperlipidemia   . Hypertension   . Hypertension   . Iron deficiency anemia   . Myocardial infarction ([place])    " BACK IN THE 90'S"  . Prostate cancer ([place])    a. 09/2008 s/p prostatectomy.  . PUD (peptic ulcer disease)   . Recurrent spontaneous pneumothorax    a. s/p L lobectomy in 1966.  Marland Kitchen Shortness of breath   . Suicide attempt ([place])    a. 08/2013 attempt by hanging with subsequent resp failure  . Syncope    a. in setting of GIB in 2012, presumed to be orthostatic.  .  Tubular adenoma of colon 2012  . Upper GI bleed    a. 2012  . Vertebral artery stenosis    a. 09/2010 s/p L vertebral stenting 09/2010.    Patient Active Problem List   Diagnosis Date Noted  . Suicidal thoughts   . Dementia with behavioral disturbance 09/01/2015  . Dementia   . Major depressive disorder without psychotic features ([place]) 05/24/2015  . Aggressive behavior   . Chest pain 09/15/2013  . Coronary atherosclerosis of native coronary artery 09/15/2013  . Hypokalemia 08/28/2013  . Acute respiratory failure ([place]) 08/25/2013  . Altered mental status 08/25/2013  . Anoxic brain injury ([place]) 08/25/2013  . HTN (hypertension) 08/25/2013  . Suicide attempt ([place]) 08/25/2013  . Vitamin B 12 deficiency 07/15/2013  . Unspecified hereditary and idiopathic peripheral neuropathy 07/15/2013  . Memory loss 07/15/2013    Past Surgical History:  Procedure Laterality Date  . CARDIAC CATHETERIZATION  09/15/2013  . CARDIAC SURGERY    . CATARACT EXTRACTION Right   . LEFT HEART CATHETERIZATION WITH CORONARY ANGIOGRAM N/A 09/15/2013   Procedure: LEFT HEART CATHETERIZATION WITH CORONARY ANGIOGRAM;  Surgeon: Burnell Blanks, MD;  Location: [facility] CATH LAB;  Service: Cardiovascular;  Laterality: N/A;  .  LUNG REMOVAL, PARTIAL  1960s   left  . PROSTATECTOMY    . vertebral artery stent          Home Medications    Prior to Admission medications   Medication Sig Start Date End Date Taking? Authorizing Provider  QUEtiapine (SEROQUEL) 25 MG tablet Take 25 mg by mouth at bedtime. 07/17/18  Yes [provider]  sertraline (ZOLOFT) 50 MG tablet Take 50 mg by mouth daily.   Yes [provider]    Family History Family History  Problem Relation Age of Onset  . Alzheimer's disease Mother   . Heart attack Father   . Prostate cancer Brother     Social History Social History   Tobacco Use  . Smoking status: Former Smoker    Packs/day: 1.00    Years: 40.00    Pack years:  40.00    Types: Cigarettes    Last attempt to quit: 08/27/2002    Years since quitting: 15.9  . Smokeless tobacco: Never Used  Substance Use Topics  . Alcohol use: No    Comment: Hx heavy EtOH use but quit 2011  . Drug use: No     Allergies   Patient has no known allergies.   Review of Systems Review of Systems  Unable to perform ROS: Dementia  Genitourinary: Positive for flank pain.  Musculoskeletal: Positive for back pain.  Psychiatric/Behavioral: Positive for behavioral problems.     Physical Exam Updated Vital Signs BP 140/66 (BP Location: Right Arm)   Pulse 62   Temp 97.7 F (36.5 C) (Oral)   Resp 16   SpO2 100%   Physical Exam  Constitutional: He appears well-developed and well-nourished. No distress.  HENT:  Head: Normocephalic and atraumatic.  Eyes: Conjunctivae are normal.  Neck: Neck supple.  Cardiovascular: Normal rate, regular rhythm and normal heart sounds. Exam reveals no friction rub.  No murmur heard. Pulmonary/Chest: Effort normal and breath sounds normal. No respiratory distress. He has no wheezes. He has no rales.  Abdominal: Soft. Bowel sounds are normal. He exhibits no distension. There is no tenderness. There is no rebound and no guarding.  Musculoskeletal: He exhibits no edema.  Moderate TTP over right paraspinal musculature. No midline TTP. No deformity.  Neurological: He is alert. He exhibits normal muscle tone.  Oriented to person and place, not time. MAE with 5/5 strength. Normal sensation to light touch b/l UE and LE. CNII-XII intact.  Skin: Skin is warm. Capillary refill takes less than 2 seconds.  Psychiatric: He has a normal mood and affect.  Calm, cooperative. Memory impaired, recent and remote.  Nursing note and vitals reviewed.    ED Treatments / Results  Labs (all labs ordered are listed, but only abnormal results are displayed) Labs Reviewed  CBC WITH DIFFERENTIAL/PLATELET - Abnormal; Notable for the following components:        Result Value   WBC 3.8 (*)    All other components within normal limits  COMPREHENSIVE METABOLIC PANEL - Abnormal; Notable for the following components:   GFR calc non Af Amer 56 (*)    All other components within normal limits  URINALYSIS, ROUTINE W REFLEX MICROSCOPIC  ETHANOL    EKG None  Radiology Dg Chest 2 View  Result Date: 07/27/2018 CLINICAL DATA:  Patient complains of right sided lower back pain x 4 days; no injury; no chest complaints; patient states he has hx of prostate ca about 4 years ago EXAM: CHEST - 2 VIEW COMPARISON:  06/29/2018  FINDINGS: Lungs are clear. Stable calcified right lower lobe granulomas. Stable blunting of the left lateral costophrenic angle. Lungs are hyperinflated, otherwise clear. No pneumothorax. No effusion. Visualized bones unremarkable. IMPRESSION: No acute cardiopulmonary disease. Electronically Signed   By: Lucrezia Europe M.D.   On: 07/27/2018 08:01   Dg Lumbar Spine Complete  Result Date: 07/27/2018 CLINICAL DATA:  Patient complains of right sided lower back pain x 4 days; no injury; no chest complaints; patient states he has hx of prostate ca about 4 years ago EXAM: [place] 4+ VIEW COMPARISON:  CT 01/01/2013 FINDINGS: Negative for fracture. Mild thoracolumbar dextroscoliosis without visualized vertebral anomaly. Anterior endplate spurring at all lumbar levels. Narrowing of interspaces L4-5 and L5-S1. Facet DJD in the lower lumbar spine. No spondylolisthesis. Aortoiliac arterial calcifications without suggestion of aneurysm. IMPRESSION: 1. Negative for fracture or other acute finding. 2. Lower lumbar spondylitic changes as above. 3.  Aortic Atherosclerosis (ICD10-170.0). Electronically Signed   By: Lucrezia Europe M.D.   On: 07/27/2018 08:04    Procedures Procedures (including critical care time)  Medications Ordered in ED Medications  acetaminophen (TYLENOL) tablet 650 mg (has no administration in time range)  ondansetron (ZOFRAN)  tablet 4 mg (has no administration in time range)  alum & mag hydroxide-simeth (MAALOX/MYLANTA) 200-200-20 MG/5ML suspension 30 mL (has no administration in time range)  QUEtiapine (SEROQUEL) tablet 25 mg (has no administration in time range)  sertraline (ZOLOFT) tablet 50 mg (has no administration in time range)     Initial Impression / Assessment and Plan / ED Course  I have reviewed the triage vital signs and the nursing notes.  Pertinent labs & imaging results that were available during my care of the patient were reviewed by me and considered in my medical decision making (see chart for details).  Clinical Course as of Jul 27 846  Mon Jul 27, 2018  5732 D/w pt's wife - she states pt has Alzheimer's, has difficult time accepting this, and is becoming more aggressive/agitated. Sees Dr. Daisy Lazar with [facility], but does not take his medications.  She states she feels unsafe with him in his current state at home.   [CI]  0818 Plain films negative. UA w/o UTI.    [CI]  0837 Mild leukopenia.  Not significantly lower than baseline.  Normal differential.  PCP follow-up.  CBC with Differential(!) [CI]    Clinical Course User Index [CI] Duffy Bruce, MD    77 year old male here with increasing aggression at home.  Patient also with incidental flank pain.  Regarding his flank pain, this is likely musculoskeletal and has been an ongoing issue.  No evidence of cord compression or radiculopathy.  No midline tenderness.  Urinalysis without UTI.  Labs are otherwise reassuring.  Otherwise, regarding his reported increasing aggression, will consult TTS his wife is concerned about his aggression, he is not taking his medications, and she feels unsafe at home.  May ultimately need social work evaluation, that this has been previously consulted per review of records.  Will follow-up TTS results regarding possible need for [facility] psych placement.  Final Clinical Impressions(s) / ED Diagnoses    Final diagnoses:  Agitation  Dementia with behavioral disturbance, unspecified dementia type    ED Discharge Orders    None       Duffy Bruce, MD 07/27/18 [phone]
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
## 2018-07-27 NOTE — ED Notes (Signed)
Bed: WA27 Expected date:  Expected time:  Means of arrival:  Comments: 

## 2018-07-27 NOTE — ED Notes (Signed)
Dinner tray provided

## 2018-07-27 NOTE — ED Notes (Signed)
Patient denies pain and is resting comfortably.  

## 2018-07-27 NOTE — ED Notes (Signed)
Pt pleasant on approach, laughing, voicing no complaints at this time. encouragement and support provided. Will continue to monitor.

## 2018-07-27 NOTE — ED Notes (Signed)
Patient transported to X-ray 

## 2018-07-28 DIAGNOSIS — F329 Major depressive disorder, single episode, unspecified: Secondary | ICD-10-CM

## 2018-07-28 NOTE — Consult Note (Addendum)
[facility] Psych ED Discharge  07/28/2018 12:43 PM Anthony Skinner  MRN:  [id number] Principal Problem: Major depressive disorder without psychotic features Discharge Diagnoses:  Patient Active Problem List   Diagnosis Date Noted  . Suicidal thoughts [R45.851]   . Dementia with behavioral disturbance [F03.91] 09/01/2015  . Dementia [F03.90]   . Major depressive disorder without psychotic features ([place]) [F32.9] 05/24/2015  . Aggressive behavior [R46.89]   . Chest pain [R07.9] 09/15/2013  . Coronary atherosclerosis of native coronary artery [I25.10] 09/15/2013  . Hypokalemia [E87.6] 08/28/2013  . Acute respiratory failure ([place]) [J96.00] 08/25/2013  . Altered mental status [R41.82] 08/25/2013  . Anoxic brain injury ([place]) [G93.1] 08/25/2013  . HTN (hypertension) [I10] 08/25/2013  . Suicide attempt ([place]) [T14.91XA] 08/25/2013  . Vitamin B 12 deficiency [E53.8] 07/15/2013  . Unspecified hereditary and idiopathic peripheral neuropathy [G60.9] 07/15/2013  . Memory loss [R41.3] 07/15/2013    Subjective:  Anthony Skinner reports, "I lost my temper and I need some temper regulation. I don't do it often, but I would like to get some help for it." Patient reports a history of MDD and early onset Alzheimers. He is reportedly taking Zoloft and Seroquel, currently under the care of Dr. Judd Gaudier at [facility]. He is not receiving any additional therapies or services however would like to do so. He states he is retired, and lives with his wife (9 years) who is present at the time of the evaluation. He denies any legal charges, substances abuse, or domestic violence. He reports one suicide attempt 4 years ago by hanging that resulted in a 2 month admission. He states he was depressed at that time. He denies any suicidal, homicidal ideation at this time. He denies any psychosis, and does not appear to be responding to internal stimuli.    Total Time spent with patient: 30 minutes  Past Psychiatric History: MDD  currently complaint with Prozac. Sees Dr. Judd Gaudier at [facility]. Previous suicide attempt by hanging x1. Resulted in inpatient admission.   Past Medical History:  Past Medical History:  Diagnosis Date  . Alzheimer disease   . CAD (coronary artery disease)    a. reported h/o MI in the 68's;  b. 04/2000 Cath: LM nl, LAD 40p, D1 small, nl, RI nl, LCX nl, RCA nl.  . Dementia   . Depression   . DJD (degenerative joint disease)   . Fatty liver   . Gastropathy 2012   reactive  . GERD (gastroesophageal reflux disease)   . Hiatal hernia   . Hyperlipidemia   . Hypertension   . Hypertension   . Iron deficiency anemia   . Myocardial infarction ([place])    " BACK IN THE 90'S"  . Prostate cancer ([place])    a. 09/2008 s/p prostatectomy.  . PUD (peptic ulcer disease)   . Recurrent spontaneous pneumothorax    a. s/p L lobectomy in 1966.  Marland Kitchen Shortness of breath   . Suicide attempt ([place])    a. 08/2013 attempt by hanging with subsequent resp failure  . Syncope    a. in setting of GIB in 2012, presumed to be orthostatic.  . Tubular adenoma of colon 2012  . Upper GI bleed    a. 2012  . Vertebral artery stenosis    a. 09/2010 s/p L vertebral stenting 09/2010.   Past Surgical History:  Procedure Laterality Date  . CARDIAC CATHETERIZATION  09/15/2013  . CARDIAC SURGERY    . CATARACT EXTRACTION Right   . LEFT HEART  CATHETERIZATION WITH CORONARY ANGIOGRAM N/A 09/15/2013   Procedure: LEFT HEART CATHETERIZATION WITH CORONARY ANGIOGRAM;  Surgeon: Burnell Blanks, MD;  Location: [facility] C[facility] LAB;  Service: Cardiovascular;  Laterality: N/A;  . LUNG REMOVAL, PARTIAL  1960s   left  . PROSTATECTOMY    . vertebral artery stent     Family History:  Family History  Problem Relation Age of Onset  . Alzheimer's disease Mother   . Heart attack Father   . Prostate cancer Brother    Family Psychiatric  History: Denies family hx Social History:  Social History   Substance and Sexual Activity   Alcohol Use No   Comment: Hx heavy EtOH use but quit 2011    Social History   Substance and Sexual Activity  Drug Use No   Social History   Socioeconomic History  . Marital status: Married    Spouse name: Anthony Skinner  . Number of children: 0  . Years of education: 12th  . Highest education level: Not on file  Occupational History  . Occupation: retired    Fish farm manager: RETIRED  Social Needs  . Financial resource strain: Not on file  . Food insecurity:    Worry: Not on file    Inability: Not on file  . Transportation needs:    Medical: Not on file    Non-medical: Not on file  Tobacco Use  . Smoking status: Former Smoker    Packs/day: 1.00    Years: 40.00    Pack years: 40.00    Types: Cigarettes    Last attempt to quit: 08/27/2002    Years since quitting: 15.9  . Smokeless tobacco: Never Used  Substance and Sexual Activity  . Alcohol use: No    Comment: Hx heavy EtOH use but quit 2011  . Drug use: No  . Sexual activity: Not Currently  Lifestyle  . Physical activity:    Days per week: Not on file    Minutes per session: Not on file  . Stress: Not on file  Relationships  . Social connections:    Talks on phone: Not on file    Gets together: Not on file    Attends religious service: Not on file    Active member of club or organization: Not on file    Attends meetings of clubs or organizations: Not on file    Relationship status: Not on file  Other Topics Concern  . Not on file  Social History Narrative   Lives in [place] with wife.  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   Caffeine Use: 4 cups daily        Has this patient used any form of tobacco in the last 30 days? (Cigarettes, Smokeless Tobacco, Cigars, and/or Pipes) A prescription for an FDA-approved tobacco cessation medication was offered at discharge and the patient refused and Prescription not provided because: patient does not use nicotine products  Current Medications: Current  Facility-Administered Medications  Medication Dose Route Frequency Provider Last Rate Last Dose  . acetaminophen (TYLENOL) tablet 650 mg  650 mg Oral Q4H PRN Duffy Bruce, MD      . alum & mag hydroxide-simeth (MAALOX/MYLANTA) 200-200-20 MG/5ML suspension 30 mL  30 mL Oral Q6H PRN Duffy Bruce, MD      . ondansetron [facility]) tablet 4 mg  4 mg Oral Q8H PRN Duffy Bruce, MD      . QUEtiapine (SEROQUEL) tablet 25 mg  25 mg Oral Gloriajean Dell, MD   25 mg at 07/27/18 2218  .  sertraline (ZOLOFT) tablet 50 mg  50 mg Oral Daily Duffy Bruce, MD   50 mg at 07/28/18 2706   Current Outpatient Medications  Medication Sig Dispense Refill  . QUEtiapine (SEROQUEL) 25 MG tablet Take 25 mg by mouth at bedtime.  1  . sertraline (ZOLOFT) 50 MG tablet Take 50 mg by mouth daily.     PTA Medications:  (Not in a hospital admission)  Musculoskeletal: Strength & Muscle Tone: within normal limits Gait & Station: normal Patient leans: N/A  Psychiatric Specialty Exam: Physical Exam  Nursing note and vitals reviewed. Constitutional: He is oriented to person, place, and time. He appears well-developed and well-nourished.  HENT:  Head: Normocephalic and atraumatic.  Neck: Normal range of motion.  Respiratory: Effort normal.  Musculoskeletal: Normal range of motion.  Neurological: He is alert and oriented to person, place, and time.  Skin: No rash noted.  Psychiatric: His speech is normal and behavior is normal. Judgment and thought content normal. Cognition and memory are normal. He exhibits a depressed mood.    Review of Systems  Psychiatric/Behavioral: Positive for depression. Negative for hallucinations, substance abuse and suicidal ideas.  All other systems reviewed and are negative.   Blood pressure 125/65, pulse 75, temperature 98.6 F (37 C), temperature source Oral, resp. rate 18, SpO2 100 %.There is no height or weight on file to calculate BMI.  General Appearance: Fairly Groomed   Eye Contact:  Good  Speech:  Clear and Coherent and Normal Rate  Volume:  Normal  Mood:  Euthymic  Affect:  Appropriate and Congruent  Thought Process:  Coherent, Goal Directed, Linear and Descriptions of Associations: Intact  Orientation:  Full (Time, Place, and Person)  Thought Content:  WDL  Suicidal Thoughts:  No  Homicidal Thoughts:  No  Memory:  Immediate;   Fair Recent;   Fair Remote;   Fair  Judgement:  Intact  Insight:  Present  Psychomotor Activity:  Normal  Concentration:  Concentration: Good and Attention Span: Good  Recall:  Good  Fund of Knowledge:  Fair  Language:  Good  Akathisia:  No  Handed:  Right  AIMS (if indicated):   N/A  Assets:  Communication Skills Desire for Improvement Financial Resources/Insurance Leisure Time Physical Health Social Support Talents/Skills  ADL's:  Intact  Cognition:  WNL  Sleep:   N/A     Demographic Factors:  Male, Age 24 or older and Caucasian  Loss Factors: NA  Historical Factors: Prior suicide attempts  Risk Reduction Factors:   Sense of responsibility to family, Religious beliefs about death, Living with another person, especially a relative, Positive social support, Positive therapeutic relationship and Positive coping skills or problem solving skills  Continued Clinical Symptoms:  Severe Anxiety and/or Agitation Depression:   Impulsivity Medical Diagnoses and Treatments/Surgeries  Cognitive Features That Contribute To Risk:  None    Suicide Risk:  Minimal: No identifiable suicidal ideation.  Patients presenting with no risk factors but with morbid ruminations; may be classified as minimal risk based on the severity of the depressive symptoms    Plan Of Care/Follow-up recommendations:  Activity:  Increase activity as tolerated Diet:  Routine diet as suggested Tests:  Routine testing as directed Other:  Even if you begin to feel better continue taking your medicaion  Disposition: Will discharge  home at this time. Will make appointment and recommendation for IOP/PHP for emotional dysregulation. He can continue his medication management under services by Dr. Judd Gaudier.  Nanci Pina, FNP 07/28/2018, 12:43  PM   Patient seen face-to-face for psychiatric evaluation, chart reviewed and case discussed with the physician extender and developed treatment plan. Reviewed the information documented and agree with the treatment plan.  Buford Dresser, DO 07/29/18 12:42 AM

## 2018-07-28 NOTE — ED Provider Notes (Signed)
Patient stable overnight, has remained calm. Vitals, labs reviewed. Patient remains medically stable for psych dispo.   Duffy Bruce, MD 07/28/18 [phone]

## 2018-07-28 NOTE — BH Assessment (Signed)
[facility] Assessment Progress Note  Per Buford Dresser, DO, this pt does not require psychiatric hospitalization at this time.  Pt is to be discharged from [facility] with recommendation to follow up with the Partial Hospitalization Program (PHP) at the [facility] at [place].  This Probation officer spoke to pt, and he is interested in the program.  At 14:15 I called and spoke to [facility], who has scheduled pt for intake on Thursday, 07/30/2018 at 14:30; he is to arrive at 14:15 with completed registration paperwork.  These details have been included in pt's discharge instructions.  I provided pt with blank registration paperwork, advising pt to complete it and take it with him to the first appointment, along with his Medicare card.  Pt's nurse, Verline Lema, has been notified.  Jalene Mullet, [place] Triage Specialist [phone]

## 2018-07-28 NOTE — ED Notes (Signed)
Aser Nylund (Wife) [phone]  Wife reports she lives about 10 minutes away and can come get him if he is discharged.

## 2018-07-28 NOTE — Discharge Instructions (Signed)
For your behavioral health needs, you are advised to follow up with the Partial Hospitalization Program (PHP) at the [facility] at [place].  This program meets Monday - Thursday from 9:00 am - 2:00 pm, and Friday from 8:00 am - 1:00 pm.  You are scheduled for an intake appointment on Thursday, July 30, 2018 at 2:15 pm.  If you have any questions, contact Lorin Glass or [employer] at the phone number indicated below:       [employer] [facility] [place] [place], [place] [address]      Contact person: Lorin Glass or Loistine Chance      [phone]

## 2018-07-28 NOTE — ED Notes (Signed)
Realized after patient took seroquel and was sleeping that the 12am vital signs were missed. Will recheck in the am.

## 2018-07-29 ENCOUNTER — Telehealth (HOSPITAL_COMMUNITY): Payer: Self-pay | Admitting: Licensed Clinical Social Worker

## 2018-07-30 ENCOUNTER — Other Ambulatory Visit (HOSPITAL_COMMUNITY): Payer: Medicare Other

## 2018-08-02 ENCOUNTER — Emergency Department (HOSPITAL_COMMUNITY)
Admission: EM | Admit: 2018-08-02 | Discharge: 2018-08-03 | Disposition: A | Discharge status: Home / Self Care | Payer: Medicare Other | Attending: Emergency Medicine | Admitting: Emergency Medicine

## 2018-08-02 ENCOUNTER — Encounter (HOSPITAL_COMMUNITY): Payer: Self-pay

## 2018-08-02 ENCOUNTER — Other Ambulatory Visit: Payer: Self-pay

## 2018-08-02 DIAGNOSIS — R45851 Suicidal ideations: Secondary | ICD-10-CM | POA: Insufficient documentation

## 2018-08-02 DIAGNOSIS — I10 Essential (primary) hypertension: Secondary | ICD-10-CM | POA: Insufficient documentation

## 2018-08-02 DIAGNOSIS — G309 Alzheimer's disease, unspecified: Secondary | ICD-10-CM | POA: Insufficient documentation

## 2018-08-02 DIAGNOSIS — I2510 Atherosclerotic heart disease of native coronary artery without angina pectoris: Secondary | ICD-10-CM | POA: Diagnosis not present

## 2018-08-02 DIAGNOSIS — F329 Major depressive disorder, single episode, unspecified: Secondary | ICD-10-CM | POA: Insufficient documentation

## 2018-08-02 DIAGNOSIS — Z87891 Personal history of nicotine dependence: Secondary | ICD-10-CM | POA: Insufficient documentation

## 2018-08-02 DIAGNOSIS — Z79899 Other long term (current) drug therapy: Secondary | ICD-10-CM | POA: Diagnosis not present

## 2018-08-02 DIAGNOSIS — I252 Old myocardial infarction: Secondary | ICD-10-CM | POA: Insufficient documentation

## 2018-08-02 DIAGNOSIS — R4689 Other symptoms and signs involving appearance and behavior: Secondary | ICD-10-CM | POA: Diagnosis not present

## 2018-08-02 DIAGNOSIS — F0390 Unspecified dementia without behavioral disturbance: Secondary | ICD-10-CM | POA: Diagnosis not present

## 2018-08-02 LAB — CBC WITH DIFFERENTIAL/PLATELET
Basophils Absolute: 0.1 10*3/uL (ref 0.0–0.1)
Basophils Relative: 2 %
Eosinophils Absolute: 0.2 10*3/uL (ref 0.0–0.7)
Eosinophils Relative: 5 %
HCT: 39.4 % (ref 39.0–52.0)
Hemoglobin: 13.1 g/dL (ref 13.0–17.0)
Lymphocytes Relative: 34 %
Lymphs Abs: 1.5 10*3/uL (ref 0.7–4.0)
MCH: 28.1 pg (ref 26.0–34.0)
MCHC: 33.2 g/dL (ref 30.0–36.0)
MCV: 84.5 fL (ref 78.0–100.0)
Monocytes Absolute: 0.5 10*3/uL (ref 0.1–1.0)
Monocytes Relative: 12 %
Neutro Abs: 2.2 10*3/uL (ref 1.7–7.7)
Neutrophils Relative %: 47 %
Platelets: 225 10*3/uL (ref 150–400)
RBC: 4.66 MIL/uL (ref 4.22–5.81)
RDW: 15.6 % — ABNORMAL HIGH (ref 11.5–15.5)
WBC: 4.5 10*3/uL (ref 4.0–10.5)

## 2018-08-02 LAB — COMPREHENSIVE METABOLIC PANEL
ALT: 13 U/L (ref 0–44)
AST: 20 U/L (ref 15–41)
Albumin: 4.2 g/dL (ref 3.5–5.0)
Alkaline Phosphatase: 61 U/L (ref 38–126)
Anion gap: 9 (ref 5–15)
BUN: 19 mg/dL (ref 8–23)
CO2: 28 mmol/L (ref 22–32)
Calcium: 9.5 mg/dL (ref 8.9–10.3)
Chloride: 103 mmol/L (ref 98–111)
Creatinine, Ser: 1.1 mg/dL (ref 0.61–1.24)
GFR calc Af Amer: >60 mL/min (ref >60)
GFR calc non Af Amer: >60 mL/min (ref >60)
Glucose, Bld: 88 mg/dL (ref 70–99)
Potassium: 4 mmol/L (ref 3.5–5.1)
Sodium: 140 mmol/L (ref 135–145)
Total Bilirubin: 0.5 mg/dL (ref 0.3–1.2)
Total Protein: 7.6 g/dL (ref 6.5–8.1)

## 2018-08-02 LAB — SALICYLATE LEVEL: Salicylate Lvl: <7 mg/dL (ref 2.8–30.0)

## 2018-08-02 LAB — ETHANOL: Alcohol, Ethyl (B): <10 mg/dL (ref <10)

## 2018-08-02 LAB — RAPID URINE DRUG SCREEN, HOSP PERFORMED
Amphetamines: NOT DETECTED
Barbiturates: NOT DETECTED
Benzodiazepines: NOT DETECTED
Cocaine: NOT DETECTED
Tetrahydrocannabinol: NOT DETECTED

## 2018-08-02 LAB — ACETAMINOPHEN LEVEL: Acetaminophen (Tylenol), Serum: <10 ug/mL — ABNORMAL LOW (ref 10–30)

## 2018-08-02 MED ORDER — QUETIAPINE FUMARATE 25 MG PO TABS: 25.0000 mg | ORAL_TABLET | Freq: Every day | ORAL | Status: DC

## 2018-08-02 MED ORDER — SERTRALINE HCL 50 MG PO TABS: 50.0000 mg | ORAL_TABLET | Freq: Every day | ORAL | Status: DC

## 2018-08-02 NOTE — Progress Notes (Signed)
Per Patriciaann Clan, PA pt does not meet criteria for inpt treatment. Calling EDP to advise but line rolls over to Retail banker. TTS tried calling RN to advise of disposition but is unavailable. Nurse secretary Leveda Anna states she will advised ED staff that pt is psych cleared and does not meet criteria for inpt treatment.  Lind Covert, MSW, LCSW Therapeutic Triage Specialist  [phone]
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
## 2018-08-02 NOTE — Progress Notes (Signed)
TTS tried calling EDP and RN again to advise that pt is psych cleared and does not meet criteria for inpt treatment but line continues to roll over to Retail banker. Leveda Anna states she will inform the ED staff that pt is psych cleared.   Lind Covert, MSW, LCSW Therapeutic Triage Specialist  [phone]

## 2018-08-02 NOTE — ED Notes (Signed)
Pt was given a cup of coffee per EDP.
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
## 2018-08-03 NOTE — ED Notes (Signed)
SOCIAL WORK AT BEDSIDE.

## 2018-08-03 NOTE — ED Notes (Signed)
EDPA [facility] Provider at bedside. LCSW AS BEDSIDE

## 2018-08-03 NOTE — ED Notes (Signed)
WIFE AT BEDSIDE HERE TO PICK UP PT.
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
## 2018-08-03 NOTE — Discharge Instructions (Signed)
Follow-up with the primary care provider on this matter.  Return to the ED as needed.

## 2018-08-03 NOTE — ED Provider Notes (Signed)
  Clinical Course as of Aug 03 1634  Sun Aug 02, 2018  2345 Plan: Patient pending TTS   [HM]  Mon Aug 03, 2018  5396 Patient has been cleared by TTS and does not meet inpatient criteria.  He will remain here in the emergency department for social work evaluation as his wife reports he may not come home.   [HM]  62 Patient's wife at the bedside stating she is here to pick the patient up.  She states she is comfortable with the patient coming home.   [SJ]    Clinical Course User Index [HM] Muthersbaugh, Jarrett Soho, PA-C [SJ] Rivan Siordia, Helane Gunther, PA-C     Social work coordinated with the patient's wife, who now states she feels safe with the patient coming home.   Lorayne Bender, PA-C 08/03/18 [place], Ankit, MD 08/06/18 [phone]

## 2019-02-24 ENCOUNTER — Emergency Department (HOSPITAL_COMMUNITY)
Admission: EM | Admit: 2019-02-24 | Discharge: 2019-02-24 | Disposition: A | Discharge status: Home / Self Care | Payer: Medicare Other | Attending: Emergency Medicine | Admitting: Emergency Medicine

## 2019-02-24 ENCOUNTER — Emergency Department (HOSPITAL_COMMUNITY): Payer: Medicare Other

## 2019-02-24 ENCOUNTER — Other Ambulatory Visit: Payer: Self-pay

## 2019-02-24 ENCOUNTER — Encounter (HOSPITAL_COMMUNITY): Payer: Self-pay

## 2019-02-24 DIAGNOSIS — F0281 Dementia in other diseases classified elsewhere with behavioral disturbance: Secondary | ICD-10-CM | POA: Diagnosis not present

## 2019-02-24 DIAGNOSIS — G309 Alzheimer's disease, unspecified: Secondary | ICD-10-CM | POA: Diagnosis not present

## 2019-02-24 DIAGNOSIS — F329 Major depressive disorder, single episode, unspecified: Secondary | ICD-10-CM | POA: Diagnosis not present

## 2019-02-24 DIAGNOSIS — I10 Essential (primary) hypertension: Secondary | ICD-10-CM | POA: Diagnosis not present

## 2019-02-24 DIAGNOSIS — F0392 Unspecified dementia, unspecified severity, with psychotic disturbance: Secondary | ICD-10-CM

## 2019-02-24 DIAGNOSIS — Z79899 Other long term (current) drug therapy: Secondary | ICD-10-CM | POA: Diagnosis not present

## 2019-02-24 DIAGNOSIS — R4689 Other symptoms and signs involving appearance and behavior: Secondary | ICD-10-CM | POA: Diagnosis present

## 2019-02-24 DIAGNOSIS — I2510 Atherosclerotic heart disease of native coronary artery without angina pectoris: Secondary | ICD-10-CM | POA: Diagnosis not present

## 2019-02-24 DIAGNOSIS — F0391 Unspecified dementia with behavioral disturbance: Secondary | ICD-10-CM

## 2019-02-24 DIAGNOSIS — F4321 Adjustment disorder with depressed mood: Secondary | ICD-10-CM | POA: Insufficient documentation

## 2019-02-24 DIAGNOSIS — Z915 Personal history of self-harm: Secondary | ICD-10-CM | POA: Insufficient documentation

## 2019-02-24 DIAGNOSIS — Z8546 Personal history of malignant neoplasm of prostate: Secondary | ICD-10-CM | POA: Insufficient documentation

## 2019-02-24 DIAGNOSIS — Z87891 Personal history of nicotine dependence: Secondary | ICD-10-CM | POA: Insufficient documentation

## 2019-02-24 LAB — RAPID URINE DRUG SCREEN, HOSP PERFORMED
Amphetamines: NOT DETECTED
Barbiturates: NOT DETECTED
Benzodiazepines: NOT DETECTED
Cocaine: NOT DETECTED
Opiates: NOT DETECTED
Tetrahydrocannabinol: NOT DETECTED

## 2019-02-24 LAB — URINALYSIS, ROUTINE W REFLEX MICROSCOPIC
Bacteria, UA: NONE SEEN
Bilirubin Urine: NEGATIVE
Glucose, UA: NEGATIVE mg/dL
Ketones, ur: NEGATIVE mg/dL
Leukocytes,Ua: NEGATIVE
Nitrite: NEGATIVE
Protein, ur: NEGATIVE mg/dL
Specific Gravity, Urine: 1.006 (ref 1.005–1.030)
pH: 6 (ref 5.0–8.0)

## 2019-02-24 LAB — CBC WITH DIFFERENTIAL/PLATELET
Abs Immature Granulocytes: 0.01 10*3/uL (ref 0.00–0.07)
Basophils Absolute: 0.1 10*3/uL (ref 0.0–0.1)
Basophils Relative: 1 %
Eosinophils Absolute: 0.1 10*3/uL (ref 0.0–0.5)
Eosinophils Relative: 2 %
HCT: 42.9 % (ref 39.0–52.0)
Hemoglobin: 13.8 g/dL (ref 13.0–17.0)
Immature Granulocytes: 0 %
Lymphocytes Relative: 24 %
Lymphs Abs: 1.3 10*3/uL (ref 0.7–4.0)
MCH: 28.6 pg (ref 26.0–34.0)
MCHC: 32.2 g/dL (ref 30.0–36.0)
MCV: 88.8 fL (ref 80.0–100.0)
Monocytes Absolute: 0.7 10*3/uL (ref 0.1–1.0)
Monocytes Relative: 12 %
Neutro Abs: 3.2 10*3/uL (ref 1.7–7.7)
Neutrophils Relative %: 61 %
Platelets: 222 10*3/uL (ref 150–400)
RBC: 4.83 MIL/uL (ref 4.22–5.81)
RDW: 15.1 % (ref 11.5–15.5)
WBC: 5.3 10*3/uL (ref 4.0–10.5)
nRBC: 0 % (ref 0.0–0.2)

## 2019-02-24 LAB — COMPREHENSIVE METABOLIC PANEL
ALT: 15 U/L (ref 0–44)
AST: 21 U/L (ref 15–41)
Albumin: 4.4 g/dL (ref 3.5–5.0)
Alkaline Phosphatase: 67 U/L (ref 38–126)
Anion gap: 7 (ref 5–15)
BUN: 13 mg/dL (ref 8–23)
CO2: 29 mmol/L (ref 22–32)
Calcium: 9.1 mg/dL (ref 8.9–10.3)
Chloride: 103 mmol/L (ref 98–111)
Creatinine, Ser: 1.05 mg/dL (ref 0.61–1.24)
GFR calc Af Amer: >60 mL/min (ref >60)
GFR calc non Af Amer: >60 mL/min (ref >60)
Glucose, Bld: 88 mg/dL (ref 70–99)
Potassium: 4.1 mmol/L (ref 3.5–5.1)
Sodium: 139 mmol/L (ref 135–145)
Total Bilirubin: 0.6 mg/dL (ref 0.3–1.2)
Total Protein: 7.9 g/dL (ref 6.5–8.1)

## 2019-02-24 LAB — TSH: TSH: 1.455 u[IU]/mL (ref 0.350–4.500)

## 2019-02-24 LAB — ETHANOL: Alcohol, Ethyl (B): <10 mg/dL (ref <10)

## 2019-02-24 MED ORDER — SERTRALINE HCL 50 MG PO TABS
50.0000 mg | ORAL_TABLET | Freq: Every day | ORAL | Status: DC
  Administered 2019-02-24: 50 mg via ORAL
  Filled 2019-02-24: qty 1

## 2019-02-24 MED ORDER — THIOTHIXENE 5 MG PO CAPS
5.0000 mg | ORAL_CAPSULE | Freq: Two times a day (BID) | ORAL | Status: DC
  Filled 2019-02-24: qty 1
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
## 2019-02-24 NOTE — ED Provider Notes (Signed)
Pt signed out by Dr. Melina Copa pending psych assessment.  Psych did see pt and have cleared him.  They spoke with his wife who is coming to get pt.   Isla Pence, MD 02/24/19 [phone]

## 2019-02-24 NOTE — ED Notes (Addendum)
Pt is alert and oriented x 3 and is verbally responsive. Pt reports that " over been getting ugly with my wife and she does not deserve it". Pt wife states that he has had aggressive behavior in the past 3 days. Pt wife state PCP referred her here to be evaluated. Pt  Wife states that she, is believing that he might need some placement. Pt is calm and cooperative at this time and is clear of his behavior, that has caused his visit to the ED. Pt made aware urine specimen.

## 2019-02-24 NOTE — ED Notes (Signed)
Urinal at bedside.  

## 2019-02-24 NOTE — ED Triage Notes (Signed)
Pt brought in by wife. Pt recently moved with wife in with their daughter. Pt is in early stages of dementia per wife. Pt has been "reading things he's not supposed to". Wife states pt has been hallucinating. Pt has been having a difficult time adjusting to the move. Pt is cooperative, denies Si/HI.

## 2019-02-24 NOTE — ED Provider Notes (Signed)
[facility] DEPT Provider Note   CSN: [id number] Arrival date & time: 02/24/19  1052    History   Chief Complaint Chief Complaint  Patient presents with  . Dementia    HPI Anthony Skinner is a 78 y.o. male.  He is brought in by his wife for evaluation of behavioral change.  He has a history of dementia and his wife states he has been more threatening to her over the last week and she is afraid for her safety.  The patient himself denies any medical complaints and denies any current suicidal or homicidal ideations and denies any auditory or visual hallucinations.  He did state yesterday he had a passive feeling of wishing he were dead but he said he would never follow through with it.  He has been taking his medications regularly.  The wife talked to the psychologist and they recommended he come to the hospital for an evaluation.  He does have a history of depression and suicide attempt noted in prior notes.     The history is provided by the patient and the spouse.  Mental Health Problem  Presenting symptoms: aggressive behavior and suicidal thoughts   Patient accompanied by:  Family member Onset quality:  Gradual Duration:  1 week Timing:  Intermittent Progression:  Waxing and waning Chronicity:  Recurrent Context: not noncompliant and not recent medication change   Treatment compliance:  All of the time Relieved by:  Nothing Worsened by:  Family interactions Associated symptoms: no abdominal pain, no chest pain and no headaches   Risk factors: hx of mental illness, hx of suicide attempts and neurological disease     Past Medical History:  Diagnosis Date  . Alzheimer disease ([place])   . CAD (coronary artery disease)    a. reported h/o MI in the 56's;  b. 04/2000 Cath: LM nl, LAD 40p, D1 small, nl, RI nl, LCX nl, RCA nl.  . Dementia ([place])   . Depression   . DJD (degenerative joint disease)   . Fatty liver   . Gastropathy 2012   reactive  .  GERD (gastroesophageal reflux disease)   . Hiatal hernia   . Hyperlipidemia   . Hypertension   . Hypertension   . Iron deficiency anemia   . Myocardial infarction ([place])    " BACK IN THE 90'S"  . Prostate cancer ([place])    a. 09/2008 s/p prostatectomy.  . PUD (peptic ulcer disease)   . Recurrent spontaneous pneumothorax    a. s/p L lobectomy in 1966.  Marland Kitchen Shortness of breath   . Suicide attempt ([place])    a. 08/2013 attempt by hanging with subsequent resp failure  . Syncope    a. in setting of GIB in 2012, presumed to be orthostatic.  . Tubular adenoma of colon 2012  . Upper GI bleed    a. 2012  . Vertebral artery stenosis    a. 09/2010 s/p L vertebral stenting 09/2010.    Patient Active Problem List   Diagnosis Date Noted  . Suicidal thoughts   . Dementia with behavioral disturbance ([place]) 09/01/2015  . Dementia ([place])   . Major depressive disorder without psychotic features ([place]) 05/24/2015  . Aggressive behavior   . Chest pain 09/15/2013  . Coronary atherosclerosis of native coronary artery 09/15/2013  . Hypokalemia 08/28/2013  . Acute respiratory failure ([place]) 08/25/2013  . Altered mental status 08/25/2013  . Anoxic brain injury ([place]) 08/25/2013  . HTN (hypertension) 08/25/2013  . Suicide  attempt ([place]) 08/25/2013  . Vitamin B 12 deficiency 07/15/2013  . Unspecified hereditary and idiopathic peripheral neuropathy 07/15/2013  . Memory loss 07/15/2013    Past Surgical History:  Procedure Laterality Date  . CARDIAC CATHETERIZATION  09/15/2013  . CARDIAC SURGERY    . CATARACT EXTRACTION Right   . LEFT HEART CATHETERIZATION WITH CORONARY ANGIOGRAM N/A 09/15/2013   Procedure: LEFT HEART CATHETERIZATION WITH CORONARY ANGIOGRAM;  Surgeon: Burnell Blanks, MD;  Location: [facility] CATH LAB;  Service: Cardiovascular;  Laterality: N/A;  . LUNG REMOVAL, PARTIAL  1960s   left  . PROSTATECTOMY    . vertebral artery stent          Home Medications    Prior to Admission  medications   Medication Sig Start Date End Date Taking? Authorizing Provider  QUEtiapine (SEROQUEL) 25 MG tablet Take 25 mg by mouth at bedtime. 07/17/18   [provider]  sertraline (ZOLOFT) 50 MG tablet Take 50 mg by mouth daily.    [provider]    Family History Family History  Problem Relation Age of Onset  . Alzheimer's disease Mother   . Heart attack Father   . Prostate cancer Brother     Social History Social History   Tobacco Use  . Smoking status: Former Smoker    Packs/day: 1.00    Years: 40.00    Pack years: 40.00    Types: Cigarettes    Last attempt to quit: 08/27/2002    Years since quitting: 16.5  . Smokeless tobacco: Never Used  Substance Use Topics  . Alcohol use: No    Comment: Hx heavy EtOH use but quit 2011  . Drug use: No     Allergies   Patient has no known allergies.   Review of Systems Review of Systems  Constitutional: Negative for fever.  HENT: Negative for sore throat.   Eyes: Negative for visual disturbance.  Respiratory: Negative for shortness of breath.   Cardiovascular: Negative for chest pain.  Gastrointestinal: Negative for abdominal pain.  Genitourinary: Negative for dysuria.  Musculoskeletal: Negative for neck pain.  Skin: Negative for rash.  Neurological: Negative for headaches.  Psychiatric/Behavioral: Positive for suicidal ideas.     Physical Exam Updated Vital Signs BP 126/72 (BP Location: Left Arm)   Pulse 73   Temp 97.9 F (36.6 C) (Oral)   Resp 12   Ht 6' (1.829 m)   Wt 61.7 kg   SpO2 96%   BMI 18.44 kg/m   Physical Exam Vitals signs and nursing note reviewed.  Constitutional:      Appearance: He is well-developed.  HENT:     Head: Normocephalic and atraumatic.  Eyes:     Conjunctiva/sclera: Conjunctivae normal.  Neck:     Musculoskeletal: Neck supple.  Cardiovascular:     Rate and Rhythm: Normal rate and regular rhythm.     Heart sounds: No murmur.  Pulmonary:     Effort:  Pulmonary effort is normal. No respiratory distress.     Breath sounds: Normal breath sounds.  Abdominal:     Palpations: Abdomen is soft.     Tenderness: There is no abdominal tenderness.  Musculoskeletal: Normal range of motion.     Right lower leg: No edema.     Left lower leg: No edema.  Skin:    General: Skin is warm and dry.     Capillary Refill: Capillary refill takes less than 2 seconds.  Neurological:     General: No focal deficit present.  Mental Status: He is alert and oriented to person, place, and time. Mental status is at baseline.     Sensory: No sensory deficit.     Motor: No weakness.  Psychiatric:        Attention and Perception: Attention normal.        Mood and Affect: Mood and affect normal.        Speech: Speech normal.        Behavior: Behavior is cooperative.        Thought Content: Thought content normal.        Cognition and Memory: Memory is impaired.      ED Treatments / Results  Labs (all labs ordered are listed, but only abnormal results are displayed) Labs Reviewed  URINALYSIS, ROUTINE W REFLEX MICROSCOPIC - Abnormal; Notable for the following components:      Result Value   Hgb urine dipstick MODERATE (*)    All other components within normal limits  COMPREHENSIVE METABOLIC PANEL  ETHANOL  RAPID URINE DRUG SCREEN, HOSP PERFORMED  CBC WITH DIFFERENTIAL/PLATELET  TSH    EKG EKG Interpretation  Date/Time:  Wednesday February 24 2019 11:54:35 EDT Ventricular Rate:  60 PR Interval:    QRS Duration: 88 QT Interval:  430 QTC Calculation: 430 R Axis:   85 Text Interpretation:  Incomplete analysis due to missing data in precordial lead(s) Normal sinus rhythm Borderline right axis deviation ST elevation, consider inferior injury Missing lead(s): V6 similar to prior 6/19 Confirmed by Aletta Edouard [phone]) on 02/24/2019 11:59:17 AM Also confirmed by Aletta Edouard [phone]), editor Philomena Doheny [phone])  on 02/24/2019 1:29:23 PM   Radiology  Ct Head Wo Contrast  Result Date: 02/24/2019 CLINICAL DATA:  Dementia, hallucinating EXAM: CT HEAD WITHOUT CONTRAST TECHNIQUE: Contiguous axial images were obtained from the base of the skull through the vertex without intravenous contrast. COMPARISON:  09/29/2013 FINDINGS: Brain: No evidence of acute infarction, hemorrhage, hydrocephalus, extra-axial collection or mass lesion/mass effect. Mild periventricular white matter hypodensity. Vascular: No hyperdense vessel or unexpected calcification. Skull: Normal. Negative for fracture or focal lesion. Sinuses/Orbits: No acute finding. Other: None. IMPRESSION: No acute intracranial pathology. Mild small-vessel white matter disease. Electronically Signed   By: Eddie Candle M.D.   On: 02/24/2019 12:26    Procedures Procedures (including critical care time)  Medications Ordered in ED Medications  sertraline (ZOLOFT) tablet 50 mg (50 mg Oral Given 02/24/19 1444)  thiothixene (NAVANE) capsule 5 mg (has no administration in time range)     Initial Impression / Assessment and Plan / ED Course  I have reviewed the triage vital signs and the nursing notes.  Pertinent labs & imaging results that were available during my care of the patient were reviewed by me and considered in my medical decision making (see chart for details).  Clinical Course as of Feb 24 1752  Wed Feb 24, 2019  1520 Patient with prior history of dementia here with behavioral issues at home regarding his wife.  He has been calm and appropriate here.  His lab work has been unremarkable so far.  Put him in for a psych consult.   [MB]    Clinical Course User Index [MB] Hayden Rasmussen, MD   Patient to be seen by TTS and Social work. Does not demonstrate any aggression here and has been very cooperative.   Signed out to Dr Gilford Raid with plan to followup on consult recommendations.      Final Clinical Impressions(s) / ED Diagnoses  Final diagnoses:  None    ED Discharge  Orders    None       Hayden Rasmussen, MD 02/24/19 1755

## 2019-02-24 NOTE — Progress Notes (Signed)
CSW received a call from psychiatric NP pt was seen by psychiatry and cleared.  Per psychiatry pt is A&OX4.  Per psychiatry there is a question of whether the pt has ever had been seen by a neurologist and diagnosed by said neurologist, and if so does the pt's wife have actual documentation, showing that pt has dementia.  CSW will continue to follow for D/C needs.  Anthony Skinner. Yanisa Goodgame, LCSW, LCAS, CSI Clinical Social Worker Ph: [phone]

## 2019-02-24 NOTE — Progress Notes (Addendum)
Patient ID: Anthony Skinner, male   DOB: Sep 21, 1941, 78 y.o.   MRN: [id number]  Pt was seen and chart reviewed with treatment team and Dr Mariea Clonts. Pt denies suicidal/homicidal ideation, denies auditory/visual hallucinations and does not appear to be responding to internal stimuli. UDS and BAL negative. CT Scan Head negative. Lab work is unremarkable. Pt stated his wife believes he has dementia and needs to be placed somewhere. He stated she grabbed something out of his hand today and he told her "I don't grab things out of your hand so don't grab them from me." Pt stated his wife keeps telling him he has dementia but he does not think so. He stated he was reading a book today and she does not like that he was reading a book called "Guilty Wives."  He stated she does not like him to read so much. He is alert & oriented x 4, calm, cooperative and neatly groomed. He is smiling and pleasant. He feels he does not need to be here and wants to be at home. He also believes his wife wants to "put him somewhere." Pt is on Zoloft and Navane and takes them as prescribed. He stated he did have a suicide attempt 5 years ago because he was hearing voices and they told him his wife would be better off without him. He has since stopped drinking and "got right with God" and has no thoughts of hurting himself or anyone else. He and his wife live with his step-daughter and her family. They moved there in November and he admitted it has been a difficult transition but he loves his step-daughter. He stated his step-daughter is at the beach this week and he thinks that is why his wife brought him to the hospital because the step-daughter is not there to tell her not to. Pt is psychiatrically clear.   Ethelene Hal, FNP-C 02/24/2019         1706  Patient's chart reviewed and case discussed with the physician extender and developed treatment plan. Reviewed the information documented and agree with the treatment plan.  Buford Dresser, DO 02/25/19 1:30 PM

## 2020-01-15 ENCOUNTER — Ambulatory Visit: Payer: Self-pay

## 2020-04-12 DIAGNOSIS — H26491 Other secondary cataract, right eye: Secondary | ICD-10-CM | POA: Diagnosis not present

## 2020-04-12 DIAGNOSIS — Z961 Presence of intraocular lens: Secondary | ICD-10-CM | POA: Diagnosis not present

## 2020-04-12 DIAGNOSIS — H2512 Age-related nuclear cataract, left eye: Secondary | ICD-10-CM | POA: Diagnosis not present

## 2020-04-12 DIAGNOSIS — H524 Presbyopia: Secondary | ICD-10-CM | POA: Diagnosis not present

## 2020-04-20 DIAGNOSIS — M1990 Unspecified osteoarthritis, unspecified site: Secondary | ICD-10-CM | POA: Diagnosis not present

## 2020-04-20 DIAGNOSIS — R030 Elevated blood-pressure reading, without diagnosis of hypertension: Secondary | ICD-10-CM | POA: Diagnosis not present

## 2020-04-20 DIAGNOSIS — I2510 Atherosclerotic heart disease of native coronary artery without angina pectoris: Secondary | ICD-10-CM | POA: Diagnosis not present

## 2020-04-20 DIAGNOSIS — E785 Hyperlipidemia, unspecified: Secondary | ICD-10-CM | POA: Diagnosis not present

## 2020-04-27 ENCOUNTER — Other Ambulatory Visit: Payer: Self-pay

## 2020-04-28 ENCOUNTER — Ambulatory Visit (INDEPENDENT_AMBULATORY_CARE_PROVIDER_SITE_OTHER): Payer: Medicare Other | Admitting: Internal Medicine

## 2020-04-28 ENCOUNTER — Encounter: Payer: Self-pay | Admitting: Internal Medicine

## 2020-04-28 VITALS — BP 130/70 | HR 73 | Temp 97.7°F | Ht 72.0 in | Wt 155.1 lb

## 2020-04-28 DIAGNOSIS — E538 Deficiency of other specified B group vitamins: Secondary | ICD-10-CM

## 2020-04-28 DIAGNOSIS — F329 Major depressive disorder, single episode, unspecified: Secondary | ICD-10-CM

## 2020-04-28 DIAGNOSIS — R413 Other amnesia: Secondary | ICD-10-CM | POA: Diagnosis not present

## 2020-04-28 MED ORDER — SERTRALINE HCL 50 MG PO TABS: 50.0000 mg | ORAL_TABLET | Freq: Every day | ORAL | 1 refills | Status: DC

## 2020-04-28 NOTE — Progress Notes (Signed)
New Patient Office Visit     This visit occurred during the SARS-CoV-2 public health emergency.  Safety protocols were in place, including screening questions prior to the visit, additional usage of staff PPE, and extensive cleaning of exam room while observing appropriate contact time as indicated for disinfecting solutions.    CC/Reason for Visit: Establish care, discuss chronic conditions, medication refills Previous PCP: Dr. Terrence Dupont Last Visit: Unknown  HPI: Anthony Skinner is a 79 y.o. male who is coming in today for the above mentioned reasons. Past Medical History is significant for: Depression and coronary artery disease.  He also had a history of prostate cancer status post prostatectomy 5 years ago.  In his chart he has a history of hypertension but his blood pressure is normal in office today and he is not on any medications.  He states he may have had a mild heart attack 30 years ago but never had a heart catheterization and is not on any cardiac medications.  He used to be a heavy smoker and drinker but quit 6 years ago.  Used to smoke 1 pack/day and would drink 1 bottle of liquor a day.  He has no known drug allergies.  He says he had a partial left lobectomy because his lungs kept collapsing in his youth.  He states his mood has been a little depressed.  He has not been on antidepressants in a long time.   Past Medical/Surgical History: Past Medical History:  Diagnosis Date  . Alzheimer disease ([place])   . CAD (coronary artery disease)    a. reported h/o MI in the 77's;  b. 04/2000 Cath: LM nl, LAD 40p, D1 small, nl, RI nl, LCX nl, RCA nl.  . Dementia ([place])   . Depression   . DJD (degenerative joint disease)   . Fatty liver   . Gastropathy 2012   reactive  . GERD (gastroesophageal reflux disease)   . Hiatal hernia   . Hyperlipidemia   . Hypertension   . Hypertension   . Iron deficiency anemia   . Myocardial infarction ([place])    " BACK IN THE 90'S"  . Prostate cancer  ([place])    a. 09/2008 s/p prostatectomy.  . PUD (peptic ulcer disease)   . Recurrent spontaneous pneumothorax    a. s/p L lobectomy in 1966.  Marland Kitchen Shortness of breath   . Suicide attempt ([place])    a. 08/2013 attempt by hanging with subsequent resp failure  . Syncope    a. in setting of GIB in 2012, presumed to be orthostatic.  . Tubular adenoma of colon 2012  . Upper GI bleed    a. 2012  . Vertebral artery stenosis    a. 09/2010 s/p L vertebral stenting 09/2010.    Past Surgical History:  Procedure Laterality Date  . CARDIAC CATHETERIZATION  09/15/2013  . CARDIAC SURGERY    . CATARACT EXTRACTION Right   . LEFT HEART CATHETERIZATION WITH CORONARY ANGIOGRAM N/A 09/15/2013   Procedure: LEFT HEART CATHETERIZATION WITH CORONARY ANGIOGRAM;  Surgeon: Burnell Blanks, MD;  Location: [facility] CATH LAB;  Service: Cardiovascular;  Laterality: N/A;  . LUNG REMOVAL, PARTIAL  1960s   left  . PROSTATECTOMY    . vertebral artery stent      Social History:  reports that he quit smoking about 17 years ago. His smoking use included cigarettes. He has a 40.00 pack-year smoking history. He has never used smokeless tobacco. He reports that he does not drink  alcohol or use drugs.  Allergies: No Known Allergies  Family History:  Family History  Problem Relation Age of Onset  . Alzheimer's disease Mother   . Heart attack Father   . Prostate cancer Brother      Current Outpatient Medications:  .  sertraline (ZOLOFT) 50 MG tablet, Take 1 tablet (50 mg total) by mouth daily., Disp: 90 tablet, Rfl: 1  Review of Systems:  Constitutional: Denies fever, chills, diaphoresis, appetite change and fatigue.  HEENT: Denies photophobia, eye pain, redness, hearing loss, ear pain, congestion, sore throat, rhinorrhea, sneezing, mouth sores, trouble swallowing, neck pain, neck stiffness and tinnitus.   Respiratory: Denies SOB, DOE, cough, chest tightness,  and wheezing.   Cardiovascular: Denies chest pain,  palpitations and leg swelling.  Gastrointestinal: Denies nausea, vomiting, abdominal pain, diarrhea, constipation, blood in stool and abdominal distention.  Genitourinary: Denies dysuria, urgency, frequency, hematuria, flank pain and difficulty urinating.  Endocrine: Denies: hot or cold intolerance, sweats, changes in hair or nails, polyuria, polydipsia. Musculoskeletal: Denies myalgias, back pain, joint swelling, arthralgias and gait problem.  Skin: Denies pallor, rash and wound.  Neurological: Denies dizziness, seizures, syncope, weakness, light-headedness, numbness and headaches.  Hematological: Denies adenopathy. Easy bruising, personal or family bleeding history  Psychiatric/Behavioral: Denies suicidal ideation,  confusion, nervousness, sleep disturbance and agitation    Physical Exam: Vitals:   04/28/20 1051  BP: 130/70  Pulse: 73  Temp: 97.7 F (36.5 C)  TempSrc: Temporal  SpO2: 99%  Weight: 155 lb 1.6 oz (70.4 kg)  Height: 6' (1.829 m)   Body mass index is 21.04 kg/m.  Constitutional: NAD, calm, comfortable Eyes: PERRL, lids and conjunctivae normal ENMT: Mucous membranes are moist.  Respiratory: clear to auscultation bilaterally, no wheezing, no crackles. Normal respiratory effort. No accessory muscle use.  Cardiovascular: Regular rate and rhythm, no murmurs / rubs / gallops. No extremity edema.  Neurologic: Grossly intact and nonfocal Psychiatric: Normal judgment and insight. Alert and oriented x 3. Normal mood.    Impression and Plan:  Vitamin B 12 deficiency -Check B12 when he returns for CPE.  Major depressive disorder without psychotic features ([place])  - Plan: sertraline (ZOLOFT) 50 MG tablet -PHQ-9 next visit  Memory loss -Consider MMSE next visit.    Patient Instructions  -Nice seeing you today!!  -See you back in 3 months for your physical! Please come in fasting.     Lelon Frohlich, MD [facility]

## 2020-04-28 NOTE — Patient Instructions (Signed)
-  Nice seeing you today!!  -See you back in 3 months for your physical! Please come in fasting.

## 2020-05-09 ENCOUNTER — Ambulatory Visit: Payer: Medicare Other | Admitting: Internal Medicine

## 2020-05-11 DIAGNOSIS — H26491 Other secondary cataract, right eye: Secondary | ICD-10-CM | POA: Diagnosis not present

## 2020-06-15 DIAGNOSIS — H25012 Cortical age-related cataract, left eye: Secondary | ICD-10-CM | POA: Diagnosis not present

## 2020-06-15 DIAGNOSIS — H25812 Combined forms of age-related cataract, left eye: Secondary | ICD-10-CM | POA: Diagnosis not present

## 2020-06-15 DIAGNOSIS — H2512 Age-related nuclear cataract, left eye: Secondary | ICD-10-CM | POA: Diagnosis not present

## 2020-08-01 ENCOUNTER — Other Ambulatory Visit: Payer: Self-pay

## 2020-08-01 ENCOUNTER — Ambulatory Visit (INDEPENDENT_AMBULATORY_CARE_PROVIDER_SITE_OTHER): Payer: Medicare Other | Admitting: Internal Medicine

## 2020-08-01 ENCOUNTER — Encounter: Payer: Self-pay | Admitting: Internal Medicine

## 2020-08-01 VITALS — BP 110/64 | HR 71 | Temp 98.3°F | Ht 71.5 in | Wt 155.4 lb

## 2020-08-01 DIAGNOSIS — F329 Major depressive disorder, single episode, unspecified: Secondary | ICD-10-CM | POA: Diagnosis not present

## 2020-08-01 DIAGNOSIS — Z0000 Encounter for general adult medical examination without abnormal findings: Secondary | ICD-10-CM

## 2020-08-01 DIAGNOSIS — I2510 Atherosclerotic heart disease of native coronary artery without angina pectoris: Secondary | ICD-10-CM

## 2020-08-01 DIAGNOSIS — E538 Deficiency of other specified B group vitamins: Secondary | ICD-10-CM | POA: Diagnosis not present

## 2020-08-01 DIAGNOSIS — E559 Vitamin D deficiency, unspecified: Secondary | ICD-10-CM | POA: Diagnosis not present

## 2020-08-01 DIAGNOSIS — I10 Essential (primary) hypertension: Secondary | ICD-10-CM

## 2020-08-01 DIAGNOSIS — Z23 Encounter for immunization: Secondary | ICD-10-CM | POA: Diagnosis not present

## 2020-08-01 DIAGNOSIS — F0390 Unspecified dementia without behavioral disturbance: Secondary | ICD-10-CM

## 2020-08-01 NOTE — Patient Instructions (Signed)
-Nice seeing you today!!  -Lab work today; will notify you once results are available.  -Pneumonia vaccine today.  -Remember your tetanus and shingles vaccine at the pharmacy.  -Schedule follow up in 6 months.   Preventive Care 77 Years and Older, Male Preventive care refers to lifestyle choices and visits with your health care provider that can promote health and wellness. This includes:  A yearly physical exam. This is also called an annual well check.  Regular dental and eye exams.  Immunizations.  Screening for certain conditions.  Healthy lifestyle choices, such as diet and exercise. What can I expect for my preventive care visit? Physical exam Your health care provider will check:  Height and weight. These may be used to calculate body mass index (BMI), which is a measurement that tells if you are at a healthy weight.  Heart rate and blood pressure.  Your skin for abnormal spots. Counseling Your health care provider may ask you questions about:  Alcohol, tobacco, and drug use.  Emotional well-being.  Home and relationship well-being.  Sexual activity.  Eating habits.  History of falls.  Memory and ability to understand (cognition).  Work and work Statistician. What immunizations do I need?  Influenza (flu) vaccine  This is recommended every year. Tetanus, diphtheria, and pertussis (Tdap) vaccine  You may need a Td booster every 10 years. Varicella (chickenpox) vaccine  You may need this vaccine if you have not already been vaccinated. Zoster (shingles) vaccine  You may need this after age 48. Pneumococcal conjugate (PCV13) vaccine  One dose is recommended after age 61. Pneumococcal polysaccharide (PPSV23) vaccine  One dose is recommended after age 46. Measles, mumps, and rubella (MMR) vaccine  You may need at least one dose of MMR if you were born in 1957 or later. You may also need a second dose. Meningococcal conjugate (MenACWY)  vaccine  You may need this if you have certain conditions. Hepatitis A vaccine  You may need this if you have certain conditions or if you travel or work in places where you may be exposed to hepatitis A. Hepatitis B vaccine  You may need this if you have certain conditions or if you travel or work in places where you may be exposed to hepatitis B. Haemophilus influenzae type b (Hib) vaccine  You may need this if you have certain conditions. You may receive vaccines as individual doses or as more than one vaccine together in one shot (combination vaccines). Talk with your health care provider about the risks and benefits of combination vaccines. What tests do I need? Blood tests  Lipid and cholesterol levels. These may be checked every 5 years, or more frequently depending on your overall health.  Hepatitis C test.  Hepatitis B test. Screening  Lung cancer screening. You may have this screening every year starting at age 62 if you have a 30-pack-year history of smoking and currently smoke or have quit within the past 15 years.  Colorectal cancer screening. All adults should have this screening starting at age 44 and continuing until age 84. Your health care provider may recommend screening at age 15 if you are at increased risk. You will have tests every 1-10 years, depending on your results and the type of screening test.  Prostate cancer screening. Recommendations will vary depending on your family history and other risks.  Diabetes screening. This is done by checking your blood sugar (glucose) after you have not eaten for a while (fasting). You may have this  done every 1-3 years.  Abdominal aortic aneurysm (AAA) screening. You may need this if you are a current or former smoker.  Sexually transmitted disease (STD) testing. Follow these instructions at home: Eating and drinking  Eat a diet that includes fresh fruits and vegetables, whole grains, lean protein, and low-fat dairy  products. Limit your intake of foods with high amounts of sugar, saturated fats, and salt.  Take vitamin and mineral supplements as recommended by your health care provider.  Do not drink alcohol if your health care provider tells you not to drink.  If you drink alcohol: ? Limit how much you have to 0-2 drinks a day. ? Be aware of how much alcohol is in your drink. In the [place] one drink equals one 12 oz bottle of beer (355 mL), one 5 oz glass of wine (148 mL), or one 1 oz glass of hard liquor (44 mL). Lifestyle  Take daily care of your teeth and gums.  Stay active. Exercise for at least 30 minutes on 5 or more days each week.  Do not use any products that contain nicotine or tobacco, such as cigarettes, e-cigarettes, and chewing tobacco. If you need help quitting, ask your health care provider.  If you are sexually active, practice safe sex. Use a condom or other form of protection to prevent STIs (sexually transmitted infections).  Talk with your health care provider about taking a low-dose aspirin or statin. What's next?  Visit your health care provider once a year for a well check visit.  Ask your health care provider how often you should have your eyes and teeth checked.  Stay up to date on all vaccines. This information is not intended to replace advice given to you by your health care provider. Make sure you discuss any questions you have with your health care provider. Document Revised: 11/26/2018 Document Reviewed: 11/26/2018 Elsevier Patient Education  2020 [employer].

## 2020-08-01 NOTE — Addendum Note (Signed)
Addended by: Westley Hummer B on: 08/01/2020 03:53 PM   Modules accepted: Orders

## 2020-08-01 NOTE — Progress Notes (Signed)
Established Patient Office Visit     This visit occurred during the SARS-CoV-2 public health emergency.  Safety protocols were in place, including screening questions prior to the visit, additional usage of staff PPE, and extensive cleaning of exam room while observing appropriate contact time as indicated for disinfecting solutions.    CC/Reason for Visit: Annual preventive exam and subsequent Medicare wellness visit  HPI: Anthony Skinner is a 79 y.o. male who is coming in today for the above mentioned reasons. Past Medical History is significant for: History of depression, coronary artery disease, prostate cancer and B12 deficiency.  He has a history of hypertension on his chart but normal blood pressures without medication.  He has no acute complaints today.  He has had his Covid vaccines, he is due for Prevnar and Tdap boosters as well as shingles vaccine, he has elected to defer cancer screening due to age.   Past Medical/Surgical History: Past Medical History:  Diagnosis Date  . Alzheimer disease ([place])   . CAD (coronary artery disease)    a. reported h/o MI in the 10's;  b. 04/2000 Cath: LM nl, LAD 40p, D1 small, nl, RI nl, LCX nl, RCA nl.  . Dementia ([place])   . Depression   . DJD (degenerative joint disease)   . Fatty liver   . Gastropathy 2012   reactive  . GERD (gastroesophageal reflux disease)   . Hiatal hernia   . Hyperlipidemia   . Hypertension   . Hypertension   . Iron deficiency anemia   . Myocardial infarction ([place])    " BACK IN THE 90'S"  . Prostate cancer ([place])    a. 09/2008 s/p prostatectomy.  . PUD (peptic ulcer disease)   . Recurrent spontaneous pneumothorax    a. s/p L lobectomy in 1966.  Marland Kitchen Shortness of breath   . Suicide attempt ([place])    a. 08/2013 attempt by hanging with subsequent resp failure  . Syncope    a. in setting of GIB in 2012, presumed to be orthostatic.  . Tubular adenoma of colon 2012  . Upper GI bleed    a. 2012  . Vertebral  artery stenosis    a. 09/2010 s/p L vertebral stenting 09/2010.    Past Surgical History:  Procedure Laterality Date  . CARDIAC CATHETERIZATION  09/15/2013  . CARDIAC SURGERY    . CATARACT EXTRACTION Right   . LEFT HEART CATHETERIZATION WITH CORONARY ANGIOGRAM N/A 09/15/2013   Procedure: LEFT HEART CATHETERIZATION WITH CORONARY ANGIOGRAM;  Surgeon: Burnell Blanks, MD;  Location: [facility] CATH LAB;  Service: Cardiovascular;  Laterality: N/A;  . LUNG REMOVAL, PARTIAL  1960s   left  . PROSTATECTOMY    . vertebral artery stent      Social History:  reports that he quit smoking about 17 years ago. His smoking use included cigarettes. He has a 40.00 pack-year smoking history. He has never used smokeless tobacco. He reports that he does not drink alcohol and does not use drugs.  Allergies: No Known Allergies  Family History:  Family History  Problem Relation Age of Onset  . Alzheimer's disease Mother   . Heart attack Father   . Prostate cancer Brother      Current Outpatient Medications:  .  sertraline (ZOLOFT) 50 MG tablet, Take 1 tablet (50 mg total) by mouth daily., Disp: 90 tablet, Rfl: 1  Review of Systems:  Constitutional: Denies fever, chills, diaphoresis, appetite change and fatigue.  HEENT: Denies photophobia, eye  pain, redness, hearing loss, ear pain, congestion, sore throat, rhinorrhea, sneezing, mouth sores, trouble swallowing, neck pain, neck stiffness and tinnitus.   Respiratory: Denies SOB, DOE, cough, chest tightness,  and wheezing.   Cardiovascular: Denies chest pain, palpitations and leg swelling.  Gastrointestinal: Denies nausea, vomiting, abdominal pain, diarrhea, constipation, blood in stool and abdominal distention.  Genitourinary: Denies dysuria, urgency, frequency, hematuria, flank pain and difficulty urinating.  Endocrine: Denies: hot or cold intolerance, sweats, changes in hair or nails, polyuria, polydipsia. Musculoskeletal: Denies myalgias, back pain,  joint swelling, arthralgias and gait problem.  Skin: Denies pallor, rash and wound.  Neurological: Denies dizziness, seizures, syncope, weakness, light-headedness, numbness and headaches.  Hematological: Denies adenopathy. Easy bruising, personal or family bleeding history  Psychiatric/Behavioral: Denies suicidal ideation, mood changes, confusion, nervousness, sleep disturbance and agitation    Physical Exam: Vitals:   08/01/20 1114  BP: 110/64  Pulse: 71  Temp: 98.3 F (36.8 C)  TempSrc: Oral  SpO2: 95%  Weight: 155 lb 6.4 oz (70.5 kg)  Height: 5' 11.5" (1.816 m)    Body mass index is 21.37 kg/m.   Constitutional: NAD, calm, comfortable Eyes: PERRL, lids and conjunctivae normal, wears corrective lenses ENMT: Mucous membranes are moist.Tympanic membrane is pearly white, no erythema or bulging. Neck: normal, supple, no masses, no thyromegaly Respiratory: clear to auscultation bilaterally, no wheezing, no crackles. Normal respiratory effort. No accessory muscle use.  Cardiovascular: Regular rate and rhythm, no murmurs / rubs / gallops. No extremity edema.  Abdomen: no tenderness, no masses palpated. No hepatosplenomegaly. Bowel sounds positive.  Musculoskeletal: no clubbing / cyanosis. No joint deformity upper and lower extremities. Good ROM, no contractures. Normal muscle tone.  Skin: no rashes, lesions, ulcers. No induration Neurologic: CN 2-12 grossly intact. Sensation intact, DTR normal. Strength 5/5 in all 4.  Psychiatric: Normal judgment and insight. Alert and oriented x 3. Normal mood.    Subsequent Medicare wellness visit   1. Risk factors, based on past  M,S,F -cardiovascular disease risk factors include age, gender, history of hypertension and hyperlipidemia   2.  Physical activities: He walks about a mile every day   3.  Depression/mood:  Stable, not depressed   4.  Hearing:  No perceived issues   5.  ADL's: Independent in all ADLs   6.  Fall risk:  Low fall  risk   7.  Home safety: No problems identified   8.  Height weight, and visual acuity: Height and weight as above, visual acuity is 20/25 with each eye independently and 20/20 with eyes together   9.  Counseling:  Advised Tdap and shingles vaccines at pharmacy   10. Lab orders based on risk factors: Laboratory update will be reviewed   11. Referral :  None today   12. Care plan:  Follow-up with me in 6 months   13. Cognitive assessment:  Mild to moderate cognitive impairment identified, history of dementia, followed by neurology   14. Screening: Patient provided with a written and personalized 5-10 year screening schedule in the AVS.   yes   15. Provider List Update:   PCP, neurology  16. Advance Directives: Full code     Office Visit from 08/01/2020 in [place] at [facility]  PHQ-9 Total Score 0      Fall Risk  04/28/2020  Falls in the past year? 0  Number falls in past yr: 0  Injury with Fall? 0     Impression and Plan:  Encounter for preventive health examination -  He has routine eye and dental care. -Prevnar in office today, he will get Tdap and shingles vaccine at his pharmacy, he has completed his Covid vaccination series. -Screening labs today. -Healthy lifestyle discussed in detail. -He has elected to defer all further cancer screening due to age.  Dementia without behavioral disturbance, unspecified dementia type ([place])  -Followed by neurology  Major depressive disorder without psychotic features ([place]) -Mood is stable on Zoloft.  Vitamin B 12 deficiency  - Plan: Vitamin B12  Essential hypertension -Well-controlled, I wonder if this diagnosis is accurate, he is not on medications.  Atherosclerosis of native coronary artery of native heart without angina pectoris  -Stable, no chest pain.    Patient Instructions  -Nice seeing you today!!  -Lab work today; will notify you once results are available.  -Pneumonia vaccine today.  -Remember  your tetanus and shingles vaccine at the pharmacy.  -Schedule follow up in 6 months.   Preventive Care 2 Years and Older, Male Preventive care refers to lifestyle choices and visits with your health care provider that can promote health and wellness. This includes:  A yearly physical exam. This is also called an annual well check.  Regular dental and eye exams.  Immunizations.  Screening for certain conditions.  Healthy lifestyle choices, such as diet and exercise. What can I expect for my preventive care visit? Physical exam Your health care provider will check:  Height and weight. These may be used to calculate body mass index (BMI), which is a measurement that tells if you are at a healthy weight.  Heart rate and blood pressure.  Your skin for abnormal spots. Counseling Your health care provider may ask you questions about:  Alcohol, tobacco, and drug use.  Emotional well-being.  Home and relationship well-being.  Sexual activity.  Eating habits.  History of falls.  Memory and ability to understand (cognition).  Work and work Statistician. What immunizations do I need?  Influenza (flu) vaccine  This is recommended every year. Tetanus, diphtheria, and pertussis (Tdap) vaccine  You may need a Td booster every 10 years. Varicella (chickenpox) vaccine  You may need this vaccine if you have not already been vaccinated. Zoster (shingles) vaccine  You may need this after age 26. Pneumococcal conjugate (PCV13) vaccine  One dose is recommended after age 70. Pneumococcal polysaccharide (PPSV23) vaccine  One dose is recommended after age 28. Measles, mumps, and rubella (MMR) vaccine  You may need at least one dose of MMR if you were born in 1957 or later. You may also need a second dose. Meningococcal conjugate (MenACWY) vaccine  You may need this if you have certain conditions. Hepatitis A vaccine  You may need this if you have certain conditions or if  you travel or work in places where you may be exposed to hepatitis A. Hepatitis B vaccine  You may need this if you have certain conditions or if you travel or work in places where you may be exposed to hepatitis B. Haemophilus influenzae type b (Hib) vaccine  You may need this if you have certain conditions. You may receive vaccines as individual doses or as more than one vaccine together in one shot (combination vaccines). Talk with your health care provider about the risks and benefits of combination vaccines. What tests do I need? Blood tests  Lipid and cholesterol levels. These may be checked every 5 years, or more frequently depending on your overall health.  Hepatitis C test.  Hepatitis B test. Screening  Lung  cancer screening. You may have this screening every year starting at age 62 if you have a 30-pack-year history of smoking and currently smoke or have quit within the past 15 years.  Colorectal cancer screening. All adults should have this screening starting at age 65 and continuing until age 20. Your health care provider may recommend screening at age 90 if you are at increased risk. You will have tests every 1-10 years, depending on your results and the type of screening test.  Prostate cancer screening. Recommendations will vary depending on your family history and other risks.  Diabetes screening. This is done by checking your blood sugar (glucose) after you have not eaten for a while (fasting). You may have this done every 1-3 years.  Abdominal aortic aneurysm (AAA) screening. You may need this if you are a current or former smoker.  Sexually transmitted disease (STD) testing. Follow these instructions at home: Eating and drinking  Eat a diet that includes fresh fruits and vegetables, whole grains, lean protein, and low-fat dairy products. Limit your intake of foods with high amounts of sugar, saturated fats, and salt.  Take vitamin and mineral supplements as  recommended by your health care provider.  Do not drink alcohol if your health care provider tells you not to drink.  If you drink alcohol: ? Limit how much you have to 0-2 drinks a day. ? Be aware of how much alcohol is in your drink. In the [place] one drink equals one 12 oz bottle of beer (355 mL), one 5 oz glass of wine (148 mL), or one 1 oz glass of hard liquor (44 mL). Lifestyle  Take daily care of your teeth and gums.  Stay active. Exercise for at least 30 minutes on 5 or more days each week.  Do not use any products that contain nicotine or tobacco, such as cigarettes, e-cigarettes, and chewing tobacco. If you need help quitting, ask your health care provider.  If you are sexually active, practice safe sex. Use a condom or other form of protection to prevent STIs (sexually transmitted infections).  Talk with your health care provider about taking a low-dose aspirin or statin. What's next?  Visit your health care provider once a year for a well check visit.  Ask your health care provider how often you should have your eyes and teeth checked.  Stay up to date on all vaccines. This information is not intended to replace advice given to you by your health care provider. Make sure you discuss any questions you have with your health care provider. Document Revised: 11/26/2018 Document Reviewed: 11/26/2018 Elsevier Patient Education  2020 [place], MD [facility]

## 2020-08-02 LAB — TSH: TSH: 1.82 mIU/L (ref 0.40–4.50)

## 2020-08-02 LAB — COMPREHENSIVE METABOLIC PANEL
AG Ratio: 1.5 (calc) (ref 1.0–2.5)
ALT: 11 U/L (ref 9–46)
AST: 16 U/L (ref 10–35)
Albumin: 4 g/dL (ref 3.6–5.1)
Alkaline phosphatase (APISO): 70 U/L (ref 35–144)
BUN: 17 mg/dL (ref 7–25)
CO2: 28 mmol/L (ref 20–32)
Calcium: 9.2 mg/dL (ref 8.6–10.3)
Chloride: 105 mmol/L (ref 98–110)
Creat: 1.16 mg/dL (ref 0.70–1.18)
Globulin: 2.7 g/dL (calc) (ref 1.9–3.7)
Glucose, Bld: 80 mg/dL (ref 65–99)
Potassium: 4.6 mmol/L (ref 3.5–5.3)
Sodium: 139 mmol/L (ref 135–146)
Total Bilirubin: 0.5 mg/dL (ref 0.2–1.2)
Total Protein: 6.7 g/dL (ref 6.1–8.1)

## 2020-08-02 LAB — LIPID PANEL
Cholesterol: 205 mg/dL — ABNORMAL HIGH (ref <200)
HDL: 70 mg/dL (ref >=40)
LDL Cholesterol (Calc): 115 mg/dL (calc) — ABNORMAL HIGH
Non-HDL Cholesterol (Calc): 135 mg/dL (calc) — ABNORMAL HIGH (ref <130)
Total CHOL/HDL Ratio: 2.9 (calc) (ref <5.0)
Triglycerides: 92 mg/dL (ref <150)

## 2020-08-02 LAB — CBC WITH DIFFERENTIAL/PLATELET
Absolute Monocytes: 466 cells/uL (ref 200–950)
Basophils Absolute: 58 cells/uL (ref 0–200)
Basophils Relative: 1.2 %
Eosinophils Absolute: 101 cells/uL (ref 15–500)
Eosinophils Relative: 2.1 %
HCT: 43.1 % (ref 38.5–50.0)
Hemoglobin: 14.1 g/dL (ref 13.2–17.1)
Lymphs Abs: 1190 cells/uL (ref 850–3900)
MCH: 28.1 pg (ref 27.0–33.0)
MCHC: 32.7 g/dL (ref 32.0–36.0)
MCV: 85.9 fL (ref 80.0–100.0)
MPV: 10 fL (ref 7.5–12.5)
Monocytes Relative: 9.7 %
Neutro Abs: 2986 cells/uL (ref 1500–7800)
Neutrophils Relative %: 62.2 %
Platelets: 274 10*3/uL (ref 140–400)
RBC: 5.02 10*6/uL (ref 4.20–5.80)
RDW: 13.9 % (ref 11.0–15.0)
Total Lymphocyte: 24.8 %
WBC: 4.8 10*3/uL (ref 3.8–10.8)

## 2020-08-02 LAB — HEMOGLOBIN A1C
Hgb A1c MFr Bld: 5.3 % of total Hgb (ref <5.7)
Mean Plasma Glucose: 105 (calc)
eAG (mmol/L): 5.8 (calc)

## 2020-08-02 LAB — VITAMIN D 25 HYDROXY (VIT D DEFICIENCY, FRACTURES): Vit D, 25-Hydroxy: 11 ng/mL — ABNORMAL LOW (ref 30–100)

## 2020-08-02 LAB — VITAMIN B12: Vitamin B-12: 286 pg/mL (ref 200–1100)

## 2020-08-03 ENCOUNTER — Other Ambulatory Visit: Payer: Self-pay | Admitting: Internal Medicine

## 2020-08-03 DIAGNOSIS — E559 Vitamin D deficiency, unspecified: Secondary | ICD-10-CM

## 2020-08-03 MED ORDER — VITAMIN D (ERGOCALCIFEROL) 1.25 MG (50000 UNIT) PO CAPS: 50000.0000 [IU] | ORAL_CAPSULE | ORAL | 0 refills | Status: AC

## 2020-09-20 DIAGNOSIS — M1990 Unspecified osteoarthritis, unspecified site: Secondary | ICD-10-CM | POA: Diagnosis not present

## 2020-09-20 DIAGNOSIS — E785 Hyperlipidemia, unspecified: Secondary | ICD-10-CM | POA: Diagnosis not present

## 2020-09-20 DIAGNOSIS — I2510 Atherosclerotic heart disease of native coronary artery without angina pectoris: Secondary | ICD-10-CM | POA: Diagnosis not present

## 2020-10-15 ENCOUNTER — Other Ambulatory Visit: Payer: Self-pay | Admitting: Internal Medicine

## 2020-10-15 DIAGNOSIS — F329 Major depressive disorder, single episode, unspecified: Secondary | ICD-10-CM

## 2020-10-19 ENCOUNTER — Other Ambulatory Visit: Payer: Self-pay | Admitting: Internal Medicine

## 2020-10-19 DIAGNOSIS — E785 Hyperlipidemia, unspecified: Secondary | ICD-10-CM | POA: Diagnosis not present

## 2020-10-19 DIAGNOSIS — I2510 Atherosclerotic heart disease of native coronary artery without angina pectoris: Secondary | ICD-10-CM | POA: Diagnosis not present

## 2020-10-19 DIAGNOSIS — E559 Vitamin D deficiency, unspecified: Secondary | ICD-10-CM

## 2020-10-26 ENCOUNTER — Encounter: Payer: Self-pay | Admitting: Podiatry

## 2020-10-26 ENCOUNTER — Ambulatory Visit (INDEPENDENT_AMBULATORY_CARE_PROVIDER_SITE_OTHER): Payer: Medicare Other | Admitting: Podiatry

## 2020-10-26 ENCOUNTER — Other Ambulatory Visit: Payer: Self-pay

## 2020-10-26 ENCOUNTER — Ambulatory Visit: Payer: Medicare Other

## 2020-10-26 DIAGNOSIS — M2011 Hallux valgus (acquired), right foot: Secondary | ICD-10-CM

## 2020-10-26 DIAGNOSIS — Q828 Other specified congenital malformations of skin: Secondary | ICD-10-CM | POA: Diagnosis not present

## 2020-10-26 DIAGNOSIS — I2510 Atherosclerotic heart disease of native coronary artery without angina pectoris: Secondary | ICD-10-CM

## 2020-10-28 NOTE — Progress Notes (Signed)
F.  Subjective:  Patient ID: Anthony Skinner, male    DOB: 1941-10-07,  MRN: [id number] HPI Chief Complaint  Patient presents with  . Callouses    Hallux (medial) and 1st MPJ (medial) right - small, callused areas x 2 months, was having it sanded down at salon but made sore  . New Patient (Initial Visit)    79 y.o. male presents with the above complaint.   ROS: Denies fever chills nausea vomiting muscle aches pains calf pain back pain chest pain shortness of breath.  Past Medical History:  Diagnosis Date  . Alzheimer disease ([place])   . CAD (coronary artery disease)    a. reported h/o MI in the 70's;  b. 04/2000 Cath: LM nl, LAD 40p, D1 small, nl, RI nl, LCX nl, RCA nl.  . Dementia ([place])   . Depression   . DJD (degenerative joint disease)   . Fatty liver   . Gastropathy 2012   reactive  . GERD (gastroesophageal reflux disease)   . Hiatal hernia   . Hyperlipidemia   . Hypertension   . Hypertension   . Iron deficiency anemia   . Myocardial infarction ([place])    " BACK IN THE 90'S"  . Prostate cancer ([place])    a. 09/2008 s/p prostatectomy.  . PUD (peptic ulcer disease)   . Recurrent spontaneous pneumothorax    a. s/p L lobectomy in 1966.  Marland Kitchen Shortness of breath   . Suicide attempt ([place])    a. 08/2013 attempt by hanging with subsequent resp failure  . Syncope    a. in setting of GIB in 2012, presumed to be orthostatic.  . Tubular adenoma of colon 2012  . Upper GI bleed    a. 2012  . Vertebral artery stenosis    a. 09/2010 s/p L vertebral stenting 09/2010.   Past Surgical History:  Procedure Laterality Date  . CARDIAC CATHETERIZATION  09/15/2013  . CARDIAC SURGERY    . CATARACT EXTRACTION Right   . LEFT HEART CATHETERIZATION WITH CORONARY ANGIOGRAM N/A 09/15/2013   Procedure: LEFT HEART CATHETERIZATION WITH CORONARY ANGIOGRAM;  Surgeon: Burnell Blanks, MD;  Location: [facility] CATH LAB;  Service: Cardiovascular;  Laterality: N/A;  . LUNG REMOVAL, PARTIAL  1960s   left  .  PROSTATECTOMY    . vertebral artery stent      Current Outpatient Medications:  .  rosuvastatin (CRESTOR) 10 MG tablet, Take 10 mg by mouth at bedtime., Disp: , Rfl:  .  sertraline (ZOLOFT) 50 MG tablet, TAKE 1 TABLET BY MOUTH EVERY DAY, Disp: 90 tablet, Rfl: 1  No Known Allergies Review of Systems Objective:  There were no vitals filed for this visit.  General: Well developed, nourished, in no acute distress, alert and oriented x3   Dermatological: Skin is warm, dry and supple bilateral. Nails x 10 are well maintained; remaining integument appears unremarkable at this time. There are no open sores, no preulcerative lesions, no rash or signs of infection present.  Reactive hyperkeratotic tissue overlying the dorsal medial aspect of the hallux interphalangeal joint as well as the first metatarsophalangeal joint.  These are not open they are mildly erythematous this  Vascular: Dorsalis Pedis artery and Posterior Tibial artery pedal pulses are 2/4 bilateral with immedate capillary fill time. Pedal hair growth present. No varicosities and no lower extremity edema present bilateral.   Neruologic: Grossly intact via light touch bilateral. Vibratory intact via tuning fork bilateral. Protective threshold with Semmes Wienstein monofilament intact to all pedal  sites bilateral. Patellar and Achilles deep tendon reflexes 2+ bilateral. No Babinski or clonus noted bilateral.   Musculoskeletal: No gross boney pedal deformities bilateral. No pain, crepitus, or limitation noted with foot and ankle range of motion bilateral. Muscular strength 5/5 in all groups tested bilateral.  Gait: Unassisted, Nonantalgic.    Radiographs:  None taken  Assessment & Plan:   Assessment: Corns and calluses  Plan: Discussed with him in great detail today the fact that his shoes are rubbing calluses on his feet and that he needs to get a new pair shoes.  Making sure that they are wide enough long enough and deep  enough.  I debrided reactive hyperkeratotic tissue for him today.     Rayya Yagi T. [place], [place]

## 2020-12-23 DIAGNOSIS — Z20822 Contact with and (suspected) exposure to covid-19: Secondary | ICD-10-CM | POA: Diagnosis not present

## 2020-12-23 DIAGNOSIS — J019 Acute sinusitis, unspecified: Secondary | ICD-10-CM | POA: Diagnosis not present

## 2021-01-15 DIAGNOSIS — M1990 Unspecified osteoarthritis, unspecified site: Secondary | ICD-10-CM | POA: Diagnosis not present

## 2021-01-15 DIAGNOSIS — I2510 Atherosclerotic heart disease of native coronary artery without angina pectoris: Secondary | ICD-10-CM | POA: Diagnosis not present

## 2021-01-15 DIAGNOSIS — I10 Essential (primary) hypertension: Secondary | ICD-10-CM | POA: Diagnosis not present

## 2021-01-15 DIAGNOSIS — E785 Hyperlipidemia, unspecified: Secondary | ICD-10-CM | POA: Diagnosis not present

## 2021-01-31 ENCOUNTER — Encounter: Payer: Self-pay | Admitting: Internal Medicine

## 2021-02-01 ENCOUNTER — Other Ambulatory Visit: Payer: Self-pay

## 2021-02-01 ENCOUNTER — Ambulatory Visit (INDEPENDENT_AMBULATORY_CARE_PROVIDER_SITE_OTHER): Payer: Medicare Other | Admitting: Internal Medicine

## 2021-02-01 DIAGNOSIS — E538 Deficiency of other specified B group vitamins: Secondary | ICD-10-CM | POA: Diagnosis not present

## 2021-02-01 DIAGNOSIS — K117 Disturbances of salivary secretion: Secondary | ICD-10-CM | POA: Diagnosis not present

## 2021-02-01 DIAGNOSIS — F0390 Unspecified dementia without behavioral disturbance: Secondary | ICD-10-CM | POA: Diagnosis not present

## 2021-02-01 DIAGNOSIS — E559 Vitamin D deficiency, unspecified: Secondary | ICD-10-CM

## 2021-02-01 NOTE — Progress Notes (Signed)
Virtual Visit via Telephone Note  I connected with Anthony Skinner on 02/01/21 at 11:00 AM EST by telephone and verified that I am speaking with the correct person using two identifiers.   I discussed the limitations, risks, security and privacy concerns of performing an evaluation and management service by telephone and the availability of in person appointments. I also discussed with the patient that there may be a patient responsible charge related to this service. The patient expressed understanding and agreed to proceed.  Location patient: home Location provider: work office Participants present for the call: patient, provider Patient did not have a visit in the prior 7 days to address this/these issue(s).   History of Present Illness:  This is a routine follow up visit for his chronic medical conditions. His PMH is significant for: CAD, depression with stable mood on Zoloft, Dementia, Vit D and B12 deficiencies, prostate cancer. He asks: "why do I have so much water coming out of my mouth?". His wife thinks he may have had a stroke. He tells me this started about 4-5 weeks ago. It has not gotten better or worse. No difficulty swallowing. He thinks it may be due to excessive coffee drinking in the am. His wife comes on the phone and tells me that he has a slight left facial droop and the drooling is on that side. No slurred speech, no upper or lower extremity weakness. Other than that he feels fine.   Observations/Objective: Patient sounds cheerful and well on the phone. Slurred speech is not evident. I do not appreciate any increased work of breathing. Speech and thought processing are grossly intact. Patient reported vitals: none reported.   Current Outpatient Medications:  .  amLODipine (NORVASC) 2.5 MG tablet, Take 2.5 mg by mouth daily., Disp: , Rfl:  .  Ascorbic Acid (VITAMIN C) 1000 MG tablet, Take 1,000 mg by mouth daily., Disp: , Rfl:  .  Cholecalciferol (D3 ADULT PO),  Take 250 mg by mouth., Disp: , Rfl:  .  rosuvastatin (CRESTOR) 10 MG tablet, Take 10 mg by mouth at bedtime., Disp: , Rfl:  .  sertraline (ZOLOFT) 50 MG tablet, TAKE 1 TABLET BY MOUTH EVERY DAY, Disp: 90 tablet, Rfl: 1 .  zinc gluconate 50 MG tablet, Take 50 mg by mouth daily., Disp: , Rfl:   Review of Systems:  Constitutional: Denies fever, chills, diaphoresis, appetite change and fatigue.  HEENT: Denies photophobia, eye pain, redness, hearing loss, ear pain, congestion, sore throat, rhinorrhea, sneezing, mouth sores, trouble swallowing, neck pain, neck stiffness and tinnitus.   Respiratory: Denies SOB, DOE, cough, chest tightness,  and wheezing.   Cardiovascular: Denies chest pain, palpitations and leg swelling.  Gastrointestinal: Denies nausea, vomiting, abdominal pain, diarrhea, constipation, blood in stool and abdominal distention.  Genitourinary: Denies dysuria, urgency, frequency, hematuria, flank pain and difficulty urinating.  Endocrine: Denies: hot or cold intolerance, sweats, changes in hair or nails, polyuria, polydipsia. Musculoskeletal: Denies myalgias, back pain, joint swelling, arthralgias and gait problem.  Skin: Denies pallor, rash and wound.  Neurological: Denies dizziness, seizures, syncope, weakness, light-headedness, numbness and headaches.  Hematological: Denies adenopathy. Easy bruising, personal or family bleeding history  Psychiatric/Behavioral: Denies suicidal ideation, mood changes, confusion, nervousness, sleep disturbance and agitation   Assessment and Plan:  Drooling from left side of mouth -I am concerned about CVA. As his facial droop and drooling began 4 weeks ago, ok to work up as an outpatient.  - Plan: CT Head Wo Contrast -If CVA  confirmed, will order ECHO/carotid dopplers and consider OP ST evaluation given his excessive drooling. -He has been educated on stroke symptoms and necessity to act fast in the future and present immediately for emergent  medical care if he suspects a stroke.  Vitamin D deficiency Vitamin B 12 deficiency -Recheck levels at next in-person visit.  Dementia without behavioral disturbance, unspecified dementia type (HCC) -Stable, no behavioral disturbances noted by spouse.    I discussed the assessment and treatment plan with the patient. The patient was provided an opportunity to ask questions and all were answered. The patient agreed with the plan and demonstrated an understanding of the instructions.   The patient was advised to call back or seek an in-person evaluation if the symptoms worsen or if the condition fails to improve as anticipated.  I provided 22 minutes of non-face-to-face time during this encounter.   Lelon Frohlich, MD [facility]

## 2021-02-08 ENCOUNTER — Encounter: Payer: Self-pay | Admitting: Internal Medicine

## 2021-02-26 ENCOUNTER — Ambulatory Visit
Admission: RE | Admit: 2021-02-26 | Discharge: 2021-02-26 | Disposition: A | Discharge status: Home / Self Care | Payer: Medicare Other | Source: Ambulatory Visit | Attending: Internal Medicine | Admitting: Internal Medicine

## 2021-02-26 ENCOUNTER — Other Ambulatory Visit: Payer: Self-pay

## 2021-02-26 DIAGNOSIS — I639 Cerebral infarction, unspecified: Secondary | ICD-10-CM | POA: Diagnosis not present

## 2021-02-26 DIAGNOSIS — K117 Disturbances of salivary secretion: Secondary | ICD-10-CM

## 2021-04-07 ENCOUNTER — Other Ambulatory Visit: Payer: Self-pay

## 2021-04-07 ENCOUNTER — Encounter (HOSPITAL_COMMUNITY): Payer: Self-pay

## 2021-04-07 ENCOUNTER — Emergency Department (HOSPITAL_COMMUNITY): Payer: Medicare Other

## 2021-04-07 ENCOUNTER — Emergency Department (HOSPITAL_COMMUNITY)
Admission: EM | Admit: 2021-04-07 | Discharge: 2021-04-07 | Disposition: A | Discharge status: Home / Self Care | Payer: Medicare Other | Attending: Emergency Medicine | Admitting: Emergency Medicine

## 2021-04-07 DIAGNOSIS — Z743 Need for continuous supervision: Secondary | ICD-10-CM | POA: Diagnosis not present

## 2021-04-07 DIAGNOSIS — Z79899 Other long term (current) drug therapy: Secondary | ICD-10-CM | POA: Diagnosis not present

## 2021-04-07 DIAGNOSIS — I2510 Atherosclerotic heart disease of native coronary artery without angina pectoris: Secondary | ICD-10-CM | POA: Diagnosis not present

## 2021-04-07 DIAGNOSIS — Z87891 Personal history of nicotine dependence: Secondary | ICD-10-CM | POA: Insufficient documentation

## 2021-04-07 DIAGNOSIS — K4090 Unilateral inguinal hernia, without obstruction or gangrene, not specified as recurrent: Secondary | ICD-10-CM | POA: Diagnosis not present

## 2021-04-07 DIAGNOSIS — R6889 Other general symptoms and signs: Secondary | ICD-10-CM | POA: Diagnosis not present

## 2021-04-07 DIAGNOSIS — G309 Alzheimer's disease, unspecified: Secondary | ICD-10-CM | POA: Diagnosis not present

## 2021-04-07 DIAGNOSIS — R110 Nausea: Secondary | ICD-10-CM | POA: Diagnosis not present

## 2021-04-07 DIAGNOSIS — R109 Unspecified abdominal pain: Secondary | ICD-10-CM

## 2021-04-07 DIAGNOSIS — I10 Essential (primary) hypertension: Secondary | ICD-10-CM | POA: Diagnosis not present

## 2021-04-07 DIAGNOSIS — R10812 Left upper quadrant abdominal tenderness: Secondary | ICD-10-CM | POA: Insufficient documentation

## 2021-04-07 DIAGNOSIS — R1032 Left lower quadrant pain: Secondary | ICD-10-CM | POA: Diagnosis not present

## 2021-04-07 DIAGNOSIS — R112 Nausea with vomiting, unspecified: Secondary | ICD-10-CM | POA: Diagnosis not present

## 2021-04-07 DIAGNOSIS — R1110 Vomiting, unspecified: Secondary | ICD-10-CM | POA: Diagnosis not present

## 2021-04-07 DIAGNOSIS — Z8546 Personal history of malignant neoplasm of prostate: Secondary | ICD-10-CM | POA: Insufficient documentation

## 2021-04-07 DIAGNOSIS — R0602 Shortness of breath: Secondary | ICD-10-CM | POA: Diagnosis not present

## 2021-04-07 LAB — CBC WITH DIFFERENTIAL/PLATELET
Abs Immature Granulocytes: 0.02 10*3/uL (ref 0.00–0.07)
Basophils Absolute: 0.1 10*3/uL (ref 0.0–0.1)
Basophils Relative: 1 %
Eosinophils Absolute: 0.1 10*3/uL (ref 0.0–0.5)
Eosinophils Relative: 1 %
HCT: 41.2 % (ref 39.0–52.0)
Hemoglobin: 13.4 g/dL (ref 13.0–17.0)
Immature Granulocytes: 0 %
Lymphocytes Relative: 6 %
Lymphs Abs: 0.6 10*3/uL — ABNORMAL LOW (ref 0.7–4.0)
MCH: 27.4 pg (ref 26.0–34.0)
MCHC: 32.5 g/dL (ref 30.0–36.0)
MCV: 84.3 fL (ref 80.0–100.0)
Monocytes Absolute: 0.7 10*3/uL (ref 0.1–1.0)
Monocytes Relative: 7 %
Neutro Abs: 8.5 10*3/uL — ABNORMAL HIGH (ref 1.7–7.7)
Neutrophils Relative %: 85 %
Platelets: 306 10*3/uL (ref 150–400)
RBC: 4.89 MIL/uL (ref 4.22–5.81)
RDW: 14.6 % (ref 11.5–15.5)
WBC: 10 10*3/uL (ref 4.0–10.5)
nRBC: 0 % (ref 0.0–0.2)

## 2021-04-07 LAB — URINALYSIS, ROUTINE W REFLEX MICROSCOPIC
Bilirubin Urine: NEGATIVE
Glucose, UA: NEGATIVE mg/dL
Hgb urine dipstick: NEGATIVE
Ketones, ur: NEGATIVE mg/dL
Leukocytes,Ua: NEGATIVE
Nitrite: NEGATIVE
Protein, ur: NEGATIVE mg/dL
Specific Gravity, Urine: 1.012 (ref 1.005–1.030)
pH: 6 (ref 5.0–8.0)

## 2021-04-07 LAB — COMPREHENSIVE METABOLIC PANEL
ALT: 15 U/L (ref 0–44)
AST: 22 U/L (ref 15–41)
Albumin: 3.6 g/dL (ref 3.5–5.0)
Alkaline Phosphatase: 76 U/L (ref 38–126)
Anion gap: 11 (ref 5–15)
BUN: 14 mg/dL (ref 8–23)
CO2: 25 mmol/L (ref 22–32)
Calcium: 9.1 mg/dL (ref 8.9–10.3)
Chloride: 103 mmol/L (ref 98–111)
Creatinine, Ser: 1.3 mg/dL — ABNORMAL HIGH (ref 0.61–1.24)
GFR, Estimated: 57 mL/min — ABNORMAL LOW (ref >60)
Glucose, Bld: 105 mg/dL — ABNORMAL HIGH (ref 70–99)
Potassium: 4.4 mmol/L (ref 3.5–5.1)
Sodium: 139 mmol/L (ref 135–145)
Total Bilirubin: 0.8 mg/dL (ref 0.3–1.2)
Total Protein: 6.8 g/dL (ref 6.5–8.1)

## 2021-04-07 LAB — TROPONIN I (HIGH SENSITIVITY)
Troponin I (High Sensitivity): 5 ng/L (ref <18)
Troponin I (High Sensitivity): 5 ng/L (ref <18)

## 2021-04-07 LAB — LIPASE, BLOOD: Lipase: 37 U/L (ref 11–51)

## 2021-04-07 MED ORDER — SODIUM CHLORIDE 0.9 % IV BOLUS
1000.0000 mL | Freq: Once | INTRAVENOUS | Status: AC
  Administered 2021-04-07: 1000 mL via INTRAVENOUS

## 2021-04-07 MED ORDER — DOCUSATE SODIUM 100 MG PO CAPS: 100.0000 mg | ORAL_CAPSULE | Freq: Two times a day (BID) | ORAL | 0 refills | Status: DC

## 2021-04-07 MED ORDER — ONDANSETRON 4 MG PO TBDP: 4.0000 mg | ORAL_TABLET | Freq: Three times a day (TID) | ORAL | 0 refills | Status: DC | PRN

## 2021-04-07 MED ORDER — IOHEXOL 300 MG/ML  SOLN
100.0000 mL | Freq: Once | INTRAMUSCULAR | Status: AC | PRN
  Administered 2021-04-07: 100 mL via INTRAVENOUS

## 2021-04-07 MED ORDER — POLYETHYLENE GLYCOL 3350 17 G PO PACK: 17.0000 g | PACK | Freq: Every day | ORAL | 0 refills | Status: DC

## 2021-04-07 NOTE — ED Provider Notes (Signed)
[facility] EMERGENCY DEPARTMENT Provider Note   CSN: [id number] Arrival date & time: 04/07/21  1013     History Chief Complaint  Patient presents with  . Abdominal Pain    LLQ and umbilicus    Anthony Skinner is a 80 y.o. male.  HPI 80 year old male presents with acute left lower abdominal pain. Started this morning when he woke up, but it didn't seem to wake him up. Pain is sharp and constant. Rates it as a 6. No chest or back pain. Went to [employer] and vomited after eating. Later he had transient dyspnea for about 20 minutes. Minimal cough. No dyspnea or cough now. Continues to have the pain, but no nausea. No change in bowels. No urinary or testicular symptoms.    Past Medical History:  Diagnosis Date  . Alzheimer disease ([place])   . CAD (coronary artery disease)    a. reported h/o MI in the 65's;  b. 04/2000 Cath: LM nl, LAD 40p, D1 small, nl, RI nl, LCX nl, RCA nl.  . Dementia ([place])   . Depression   . DJD (degenerative joint disease)   . Fatty liver   . Gastropathy 2012   reactive  . GERD (gastroesophageal reflux disease)   . Hiatal hernia   . Hyperlipidemia   . Hypertension   . Hypertension   . Iron deficiency anemia   . Myocardial infarction ([place])    " BACK IN THE 90'S"  . Prostate cancer ([place])    a. 09/2008 s/p prostatectomy.  . PUD (peptic ulcer disease)   . Recurrent spontaneous pneumothorax    a. s/p L lobectomy in 1966.  Marland Kitchen Shortness of breath   . Suicide attempt ([place])    a. 08/2013 attempt by hanging with subsequent resp failure  . Syncope    a. in setting of GIB in 2012, presumed to be orthostatic.  . Tubular adenoma of colon 2012  . Upper GI bleed    a. 2012  . Vertebral artery stenosis    a. 09/2010 s/p L vertebral stenting 09/2010.    Patient Active Problem List   Diagnosis Date Noted  . Vitamin D deficiency 08/03/2020  . Urethral stricture 07/23/2017  . Acute kidney injury ([place]) 02/14/2017  . GERD (gastroesophageal  reflux disease) 02/14/2017  . Severe major depression with psychotic features ([place]) 02/13/2017  . Urinary urgency 09/20/2016  . Moderate episode of recurrent major depressive disorder ([place]) 10/14/2015  . Suicidal thoughts   . Dementia with behavioral disturbance ([place]) 09/01/2015  . Dementia ([place])   . Major depressive disorder without psychotic features ([place]) 05/24/2015  . Aggressive behavior   . SUI (stress urinary incontinence), male 11/28/2014  . Malignant neoplasm of prostate ([place]) 05/23/2014  . Chest pain 09/15/2013  . Coronary atherosclerosis of native coronary artery 09/15/2013  . Hypokalemia 08/28/2013  . Acute respiratory failure ([place]) 08/25/2013  . Altered mental status 08/25/2013  . Anoxic brain injury ([place]) 08/25/2013  . HTN (hypertension) 08/25/2013  . Suicide attempt ([place]) 08/25/2013  . Vitamin B 12 deficiency 07/15/2013  . Unspecified hereditary and idiopathic peripheral neuropathy 07/15/2013  . Memory loss 07/15/2013    Past Surgical History:  Procedure Laterality Date  . CARDIAC CATHETERIZATION  09/15/2013  . CARDIAC SURGERY    . CATARACT EXTRACTION Right   . LEFT HEART CATHETERIZATION WITH CORONARY ANGIOGRAM N/A 09/15/2013   Procedure: LEFT HEART CATHETERIZATION WITH CORONARY ANGIOGRAM;  Surgeon: Burnell Blanks, MD;  Location: [facility] CATH LAB;  Service: Cardiovascular;  Laterality: N/A;  . LUNG REMOVAL, PARTIAL  1960s   left  . PROSTATECTOMY    . vertebral artery stent         Family History  Problem Relation Age of Onset  . Alzheimer's disease Mother   . Heart attack Father   . Prostate cancer Brother     Social History   Tobacco Use  . Smoking status: Former Smoker    Packs/day: 1.00    Years: 40.00    Pack years: 40.00    Types: Cigarettes    Quit date: 08/27/2002    Years since quitting: 18.6  . Smokeless tobacco: Never Used  Vaping Use  . Vaping Use: Never used  Substance Use Topics  . Alcohol use: No    Comment: Hx heavy EtOH use  but quit 2011  . Drug use: No    Home Medications Prior to Admission medications   Medication Sig Start Date End Date Taking? Authorizing Provider  docusate sodium (COLACE) 100 MG capsule Take 1 capsule (100 mg total) by mouth every 12 (twelve) hours. 04/07/21  Yes Sherwood Gambler, MD  ondansetron (ZOFRAN ODT) 4 MG disintegrating tablet Take 1 tablet (4 mg total) by mouth every 8 (eight) hours as needed for nausea or vomiting. 04/07/21  Yes Sherwood Gambler, MD  polyethylene glycol (MIRALAX / GLYCOLAX) 17 g packet Take 17 g by mouth daily. 04/07/21  Yes Sherwood Gambler, MD  amLODipine (NORVASC) 2.5 MG tablet Take 2.5 mg by mouth daily. 01/15/21   [provider]  Ascorbic Acid (VITAMIN C) 1000 MG tablet Take 1,000 mg by mouth daily.    [provider]  Cholecalciferol (D3 ADULT PO) Take 250 mg by mouth.    [provider]  rosuvastatin (CRESTOR) 10 MG tablet Take 10 mg by mouth at bedtime. 10/20/20   [provider]  sertraline (ZOLOFT) 50 MG tablet TAKE 1 TABLET BY MOUTH EVERY DAY 10/16/20   Isaac Bliss, Rayford Halsted, MD  zinc gluconate 50 MG tablet Take 50 mg by mouth daily.    [provider]    Allergies    Patient has no known allergies.  Review of Systems   Review of Systems  Constitutional: Negative for fever.  Respiratory: Positive for shortness of breath.   Cardiovascular: Negative for chest pain.  Gastrointestinal: Positive for abdominal pain and vomiting. Negative for constipation and diarrhea.  Genitourinary: Negative for dysuria and hematuria.  Musculoskeletal: Negative for back pain.  All other systems reviewed and are negative.   Physical Exam Updated Vital Signs BP (!) 140/121 (BP Location: Right Wrist)   Pulse 75   Temp 98.6 F (37 C) (Oral)   Resp 20   SpO2 100%   Physical Exam Vitals and nursing note reviewed.  Constitutional:      General: He is not in acute distress.    Appearance: He is well-developed. He is not  ill-appearing or diaphoretic.  HENT:     Head: Normocephalic and atraumatic.     Right Ear: External ear normal.     Left Ear: External ear normal.     Nose: Nose normal.  Eyes:     General:        Right eye: No discharge.        Left eye: No discharge.  Cardiovascular:     Rate and Rhythm: Normal rate and regular rhythm.     Heart sounds: Normal heart sounds.  Pulmonary:     Effort: Pulmonary effort  is normal.     Breath sounds: Normal breath sounds.  Abdominal:     Palpations: Abdomen is soft.     Tenderness: There is abdominal tenderness in the left upper quadrant and left lower quadrant.  Musculoskeletal:     Cervical back: Neck supple.  Skin:    General: Skin is warm and dry.  Neurological:     Mental Status: He is alert.  Psychiatric:        Mood and Affect: Mood is not anxious.     ED Results / Procedures / Treatments   Labs (all labs ordered are listed, but only abnormal results are displayed) Labs Reviewed  COMPREHENSIVE METABOLIC PANEL - Abnormal; Notable for the following components:      Result Value   Glucose, Bld 105 (*)    Creatinine, Ser 1.30 (*)    GFR, Estimated 57 (*)    All other components within normal limits  CBC WITH DIFFERENTIAL/PLATELET - Abnormal; Notable for the following components:   Neutro Abs 8.5 (*)    Lymphs Abs 0.6 (*)    All other components within normal limits  LIPASE, BLOOD  URINALYSIS, ROUTINE W REFLEX MICROSCOPIC  TROPONIN I (HIGH SENSITIVITY)  TROPONIN I (HIGH SENSITIVITY)    EKG EKG Interpretation  Date/Time:  Saturday April 07 2021 10:33:15 EDT Ventricular Rate:  64 PR Interval:  161 QRS Duration: 94 QT Interval:  422 QTC Calculation: 436 R Axis:   71 Text Interpretation: Sinus rhythm ST elevation, similar to Mar 2020 Confirmed by Sherwood Gambler [phone]) on 04/07/2021 10:34:24 AM   Radiology CT ABDOMEN PELVIS W CONTRAST  Result Date: 04/07/2021 CLINICAL DATA:  Abdominal pain with nausea and vomiting which has  mostly resolved according to the patient EXAM: CT ABDOMEN AND PELVIS WITH CONTRAST TECHNIQUE: Multidetector CT imaging of the abdomen and pelvis was performed using the standard protocol following bolus administration of intravenous contrast. CONTRAST:  118mL OMNIPAQUE IOHEXOL 300 MG/ML  SOLN COMPARISON:  01/01/2013 FINDINGS: Lower chest: Calcified granuloma noted in the right lower lobe. Chronic smoking related changes are noted bilaterally. Scarring is present within the left base and there is mild peripheral and basilar predominant interstitial reticulation. Hepatobiliary: No focal liver abnormality is seen. No gallstones, gallbladder wall thickening, or biliary dilatation. Pancreas: Unremarkable. No pancreatic ductal dilatation or surrounding inflammatory changes. Spleen: Normal in size without focal abnormality. Adrenals/Urinary Tract: Normal appearance of the adrenal glands. No kidney mass or hydronephrosis. The urinary bladder appears within normal limits. Stomach/Bowel: Small hiatal hernia. Stomach is otherwise within normal limits. The appendix is visualized and appears normal. No bowel wall thickening, inflammation or distension. Vascular/Lymphatic: Aortic atherosclerosis. No enlarged abdominal or pelvic lymph nodes. Reproductive: Status post prostatectomy Other: Small right inguinal hernia contains fat and a nonobstructed loop of small bowel. Musculoskeletal: No acute or significant osseous findings. Degenerative disc disease identified at L4-5 and L5-S1. IMPRESSION: 1. No acute findings identified within the abdomen or pelvis. 2. Small right inguinal hernia contains fat and a nonobstructed loop of small bowel. Correlate for any clinical signs or symptoms of intermittent obstruction secondary to hernia. 3. Aortic atherosclerosis. Aortic Atherosclerosis (ICD10-I70.0). Electronically Signed   By: Kerby Moors M.D.   On: 04/07/2021 12:42   DG Chest Portable 1 View  Result Date: 04/07/2021 CLINICAL  DATA:  Abdominal pain EXAM: PORTABLE CHEST 1 VIEW COMPARISON:  07/27/2018 FINDINGS: The heart size and mediastinal contours are within normal limits. Stable calcified granulomas within the right lower lobe. No focal airspace consolidation, pleural  effusion, or pneumothorax. The visualized skeletal structures are unremarkable. IMPRESSION: No active disease. Electronically Signed   By: Davina Poke D.O.   On: 04/07/2021 11:07    Procedures Procedures   Medications Ordered in ED Medications  sodium chloride 0.9 % bolus 1,000 mL (0 mLs Intravenous Stopped 04/07/21 1308)  iohexol (OMNIPAQUE) 300 MG/ML solution 100 mL (100 mLs Intravenous Contrast Given 04/07/21 1204)    ED Course  I have reviewed the triage vital signs and the nursing notes.  Pertinent labs & imaging results that were available during my care of the patient were reviewed by me and considered in my medical decision making (see chart for details).    MDM Rules/Calculators/A&P                          Patient CT has been personally reviewed and there is no obvious intra-abdominal pathology.  He does have a right-sided inguinal hernia but he has no right-sided symptoms and no pain now.  My suspicion is this is not incarcerated or has been recently.  It is noted that he has a fair amount of stool and I wonder if this is contributing to his transient abdominal pain.  I will give him MiraLAX and Colace as well as ondansetron as needed.  The shortness of breath is also of unclear etiology and with troponins negative x2 and negative x-ray I think this is related to the acute vomiting.  Will discharge home with return precautions. Final Clinical Impression(s) / ED Diagnoses Final diagnoses:  Left sided abdominal pain  Right inguinal hernia    Rx / DC Orders ED Discharge Orders         Ordered    polyethylene glycol (MIRALAX / GLYCOLAX) 17 g packet  Daily        04/07/21 1433    docusate sodium (COLACE) 100 MG capsule  Every 12  hours        04/07/21 1433    ondansetron (ZOFRAN ODT) 4 MG disintegrating tablet  Every 8 hours PRN        04/07/21 1433           Sherwood Gambler, MD 04/07/21 1519

## 2021-04-07 NOTE — ED Notes (Signed)
Daughter wants to be called if discharged

## 2021-04-07 NOTE — ED Triage Notes (Signed)
Pt called ems x2 this am, once from restaurant for N/V and pain after taking a bite of breakfast. Pt went home and called back reporting SOB and continued pain. Pt 100% RA on arrival to ED, EMS reports 98% during transport. LLQ and umbilicus pain started 7am, N/V started as pt took first bite around 8:30am. A&O x4, ambulatory, and communicating needs.

## 2021-04-07 NOTE — Discharge Instructions (Addendum)
If you develop worsening, continued, or recurrent abdominal pain, uncontrolled vomiting, fever, chest or back pain, or any other new/concerning symptoms then return to the ER for evaluation.  

## 2021-04-07 NOTE — ED Notes (Signed)
Pt ambulated to bathroom on his own power, gait even and steady, no assistance required

## 2021-04-10 DIAGNOSIS — H524 Presbyopia: Secondary | ICD-10-CM | POA: Diagnosis not present

## 2021-04-10 DIAGNOSIS — H10413 Chronic giant papillary conjunctivitis, bilateral: Secondary | ICD-10-CM | POA: Diagnosis not present

## 2021-04-10 DIAGNOSIS — H5213 Myopia, bilateral: Secondary | ICD-10-CM | POA: Diagnosis not present

## 2021-04-10 DIAGNOSIS — Z961 Presence of intraocular lens: Secondary | ICD-10-CM | POA: Diagnosis not present

## 2021-04-19 ENCOUNTER — Inpatient Hospital Stay: Payer: Medicare Other | Admitting: Internal Medicine

## 2021-04-27 ENCOUNTER — Ambulatory Visit (INDEPENDENT_AMBULATORY_CARE_PROVIDER_SITE_OTHER): Payer: Medicare Other | Admitting: Internal Medicine

## 2021-04-27 ENCOUNTER — Other Ambulatory Visit: Payer: Self-pay

## 2021-04-27 ENCOUNTER — Encounter: Payer: Self-pay | Admitting: Internal Medicine

## 2021-04-27 DIAGNOSIS — F329 Major depressive disorder, single episode, unspecified: Secondary | ICD-10-CM | POA: Diagnosis not present

## 2021-04-27 MED ORDER — SERTRALINE HCL 50 MG PO TABS: 1.0000 | ORAL_TABLET | Freq: Every day | ORAL | 1 refills | Status: DC

## 2021-04-27 NOTE — Progress Notes (Signed)
Established Patient Office Visit     This visit occurred during the SARS-CoV-2 public health emergency.  Safety protocols were in place, including screening questions prior to the visit, additional usage of staff PPE, and extensive cleaning of exam room while observing appropriate contact time as indicated for disinfecting solutions.    CC/Reason for Visit: Follow-up depression and other chronic conditions  HPI: Anthony Skinner is a 80 y.o. male who is coming in today for the above mentioned reasons. Past Medical History is significant for: Coronary artery disease, depression, hypertension, hyperlipidemia mild dementia, history of prostate cancer, vitamin D and vitamin B12 deficiency.  He visited the emergency department on April 23 due to abdominal pain.  Work-up was normal with the exception of the finding of a right inguinal hernia on CT scan.  He was discharged home.  He has had no further issues with his hernia.  He is requesting refills of his Zoloft today.  He feels it has really helped him.   Past Medical/Surgical History: Past Medical History:  Diagnosis Date  . Alzheimer disease ([place])   . CAD (coronary artery disease)    a. reported h/o MI in the 62's;  b. 04/2000 Cath: LM nl, LAD 40p, D1 small, nl, RI nl, LCX nl, RCA nl.  . Dementia ([place])   . Depression   . DJD (degenerative joint disease)   . Fatty liver   . Gastropathy 2012   reactive  . GERD (gastroesophageal reflux disease)   . Hiatal hernia   . Hyperlipidemia   . Hypertension   . Hypertension   . Iron deficiency anemia   . Myocardial infarction ([place])    " BACK IN THE 90'S"  . Prostate cancer ([place])    a. 09/2008 s/p prostatectomy.  . PUD (peptic ulcer disease)   . Recurrent spontaneous pneumothorax    a. s/p L lobectomy in 1966.  Marland Kitchen Shortness of breath   . Suicide attempt ([place])    a. 08/2013 attempt by hanging with subsequent resp failure  . Syncope    a. in setting of GIB in 2012, presumed to be  orthostatic.  . Tubular adenoma of colon 2012  . Upper GI bleed    a. 2012  . Vertebral artery stenosis    a. 09/2010 s/p L vertebral stenting 09/2010.    Past Surgical History:  Procedure Laterality Date  . CARDIAC CATHETERIZATION  09/15/2013  . CARDIAC SURGERY    . CATARACT EXTRACTION Right   . LEFT HEART CATHETERIZATION WITH CORONARY ANGIOGRAM N/A 09/15/2013   Procedure: LEFT HEART CATHETERIZATION WITH CORONARY ANGIOGRAM;  Surgeon: Burnell Blanks, MD;  Location: [facility] CATH LAB;  Service: Cardiovascular;  Laterality: N/A;  . LUNG REMOVAL, PARTIAL  1960s   left  . PROSTATECTOMY    . vertebral artery stent      Social History:  reports that he quit smoking about 18 years ago. His smoking use included cigarettes. He has a 40.00 pack-year smoking history. He has never used smokeless tobacco. He reports that he does not drink alcohol and does not use drugs.  Allergies: No Known Allergies  Family History:  Family History  Problem Relation Age of Onset  . Alzheimer's disease Mother   . Heart attack Father   . Prostate cancer Brother      Current Outpatient Medications:  .  amLODipine (NORVASC) 2.5 MG tablet, Take 2.5 mg by mouth daily., Disp: , Rfl:  .  Ascorbic Acid (VITAMIN C) 1000 MG  tablet, Take 1,000 mg by mouth daily., Disp: , Rfl:  .  Cholecalciferol (D3 ADULT PO), Take 250 mg by mouth., Disp: , Rfl:  .  docusate sodium (COLACE) 100 MG capsule, Take 1 capsule (100 mg total) by mouth every 12 (twelve) hours., Disp: 60 capsule, Rfl: 0 .  ondansetron (ZOFRAN ODT) 4 MG disintegrating tablet, Take 1 tablet (4 mg total) by mouth every 8 (eight) hours as needed for nausea or vomiting., Disp: 10 tablet, Rfl: 0 .  polyethylene glycol (MIRALAX / GLYCOLAX) 17 g packet, Take 17 g by mouth daily., Disp: 14 each, Rfl: 0 .  rosuvastatin (CRESTOR) 10 MG tablet, Take 10 mg by mouth at bedtime., Disp: , Rfl:  .  zinc gluconate 50 MG tablet, Take 50 mg by mouth daily., Disp: , Rfl:  .   sertraline (ZOLOFT) 50 MG tablet, Take 1 tablet (50 mg total) by mouth daily., Disp: 90 tablet, Rfl: 1  Review of Systems:  Constitutional: Denies fever, chills, diaphoresis, appetite change and fatigue.  HEENT: Denies photophobia, eye pain, redness, hearing loss, ear pain, congestion, sore throat, rhinorrhea, sneezing, mouth sores, trouble swallowing, neck pain, neck stiffness and tinnitus.   Respiratory: Denies SOB, DOE, cough, chest tightness,  and wheezing.   Cardiovascular: Denies chest pain, palpitations and leg swelling.  Gastrointestinal: Denies nausea, vomiting, abdominal pain, diarrhea, constipation, blood in stool and abdominal distention.  Genitourinary: Denies dysuria, urgency, frequency, hematuria, flank pain and difficulty urinating.  Endocrine: Denies: hot or cold intolerance, sweats, changes in hair or nails, polyuria, polydipsia. Musculoskeletal: Denies myalgias, back pain, joint swelling, arthralgias and gait problem.  Skin: Denies pallor, rash and wound.  Neurological: Denies dizziness, seizures, syncope, weakness, light-headedness, numbness and headaches.  Hematological: Denies adenopathy. Easy bruising, personal or family bleeding history  Psychiatric/Behavioral: Denies suicidal ideation, mood changes, confusion, nervousness, sleep disturbance and agitation    Physical Exam: Vitals:   04/27/21 1559  BP: 130/70  Pulse: 89  Temp: 98.1 F (36.7 C)  TempSrc: Oral  SpO2: 97%  Weight: 170 lb 11.2 oz (77.4 kg)    Body mass index is 23.48 kg/m.   Constitutional: NAD, calm, comfortable Eyes: PERRL, lids and conjunctivae normal ENMT: Mucous membranes are moist.  Respiratory: clear to auscultation bilaterally, no wheezing, no crackles. Normal respiratory effort. No accessory muscle use.  Cardiovascular: Regular rate and rhythm, no murmurs / rubs / gallops. No extremity edema.  Neurologic: Grossly intact and nonfocal.  Psychiatric: Normal judgment and insight. Alert  and oriented x 3. Normal mood.    Impression and Plan:  Major depressive disorder without psychotic features ([place])  - Plan: sertraline (ZOLOFT) 50 MG tablet Follow-up in August for his physical.    Patient Instructions  -Nice seeing you today!!  -Schedule follow up in 4 months for your physical. Please come in fasting that day.     Lelon Frohlich, MD [facility]

## 2021-04-27 NOTE — Patient Instructions (Signed)
-  Nice seeing you today!!  -Schedule follow up in 4 months for your physical. Please come in fasting that day.

## 2021-05-07 ENCOUNTER — Telehealth: Payer: Self-pay | Admitting: Internal Medicine

## 2021-05-07 NOTE — Telephone Encounter (Signed)
pts daughter is calling in to see if the pt still needs to keep the Thursday 05/10/2021 appointment since the pt was seen last week.  Daughter would like to have a call back.

## 2021-05-08 NOTE — Telephone Encounter (Signed)
Patient cancelled

## 2021-05-10 ENCOUNTER — Ambulatory Visit: Payer: Medicare Other | Admitting: Internal Medicine

## 2021-05-15 ENCOUNTER — Telehealth: Payer: Self-pay | Admitting: Internal Medicine

## 2021-05-15 NOTE — Telephone Encounter (Signed)
error 

## 2021-06-05 ENCOUNTER — Telehealth: Payer: Self-pay | Admitting: Internal Medicine

## 2021-06-05 NOTE — Telephone Encounter (Signed)
FL-2 form to be signed -- Placed in doctor's folder.  Call Annabell Sabal (Social Worker) when complete.

## 2021-06-07 NOTE — Telephone Encounter (Signed)
FL2 ready for pick up and Mikaya is aware.  Medical records will be ready later.

## 2021-07-02 ENCOUNTER — Telehealth: Payer: Self-pay | Admitting: Internal Medicine

## 2021-07-02 DIAGNOSIS — Z111 Encounter for screening for respiratory tuberculosis: Secondary | ICD-10-CM

## 2021-07-02 NOTE — Telephone Encounter (Signed)
Pts daughter is calling in stating that the pt is going to [facility] in [place] and is needing for the pt to have a [place] TB test done may I have a order for this so that the pt can be scheduled for labs.  Pt has COVID since 06/27/2021

## 2021-07-03 NOTE — Telephone Encounter (Signed)
Order in, okay to schedule, thank you!

## 2021-07-03 NOTE — Telephone Encounter (Signed)
Called pts daughter to get scheduled and she will call back when the pt is symptom free of the COVID.

## 2021-07-11 ENCOUNTER — Other Ambulatory Visit: Payer: Self-pay

## 2021-07-11 ENCOUNTER — Other Ambulatory Visit (INDEPENDENT_AMBULATORY_CARE_PROVIDER_SITE_OTHER): Payer: Medicare Other

## 2021-07-11 DIAGNOSIS — Z111 Encounter for screening for respiratory tuberculosis: Secondary | ICD-10-CM

## 2021-07-11 DIAGNOSIS — E559 Vitamin D deficiency, unspecified: Secondary | ICD-10-CM

## 2021-07-11 LAB — VITAMIN D 25 HYDROXY (VIT D DEFICIENCY, FRACTURES): VITD: 46.04 ng/mL (ref 30.00–100.00)

## 2021-07-11 NOTE — Addendum Note (Signed)
Addended by: Amanda Cockayne on: 07/11/2021 09:16 AM   Modules accepted: Orders

## 2021-07-11 NOTE — Addendum Note (Signed)
Addended by: Amanda Cockayne on: 07/11/2021 09:19 AM   Modules accepted: Orders

## 2021-07-14 LAB — QUANTIFERON-TB GOLD PLUS
Mitogen-NIL: >10 IU/mL
NIL: 0.04 IU/mL
QuantiFERON-TB Gold Plus: NEGATIVE
TB1-NIL: 0.01 IU/mL
TB2-NIL: <0 IU/mL

## 2021-08-01 ENCOUNTER — Other Ambulatory Visit: Payer: Self-pay

## 2021-08-02 ENCOUNTER — Ambulatory Visit (INDEPENDENT_AMBULATORY_CARE_PROVIDER_SITE_OTHER): Payer: Medicare Other | Admitting: Internal Medicine

## 2021-08-02 ENCOUNTER — Encounter: Payer: Self-pay | Admitting: Internal Medicine

## 2021-08-02 VITALS — BP 120/78 | HR 61 | Temp 97.7°F | Ht 71.5 in | Wt 173.5 lb

## 2021-08-02 DIAGNOSIS — C61 Malignant neoplasm of prostate: Secondary | ICD-10-CM

## 2021-08-02 DIAGNOSIS — E538 Deficiency of other specified B group vitamins: Secondary | ICD-10-CM | POA: Diagnosis not present

## 2021-08-02 DIAGNOSIS — Z0000 Encounter for general adult medical examination without abnormal findings: Secondary | ICD-10-CM

## 2021-08-02 DIAGNOSIS — I2510 Atherosclerotic heart disease of native coronary artery without angina pectoris: Secondary | ICD-10-CM

## 2021-08-02 DIAGNOSIS — K219 Gastro-esophageal reflux disease without esophagitis: Secondary | ICD-10-CM

## 2021-08-02 DIAGNOSIS — F0390 Unspecified dementia without behavioral disturbance: Secondary | ICD-10-CM

## 2021-08-02 DIAGNOSIS — E559 Vitamin D deficiency, unspecified: Secondary | ICD-10-CM

## 2021-08-02 DIAGNOSIS — I700 Atherosclerosis of aorta: Secondary | ICD-10-CM

## 2021-08-02 DIAGNOSIS — I10 Essential (primary) hypertension: Secondary | ICD-10-CM | POA: Diagnosis not present

## 2021-08-02 DIAGNOSIS — F329 Major depressive disorder, single episode, unspecified: Secondary | ICD-10-CM

## 2021-08-02 LAB — COMPREHENSIVE METABOLIC PANEL
ALT: 12 U/L (ref 0–53)
AST: 15 U/L (ref 0–37)
Albumin: 4 g/dL (ref 3.5–5.2)
Alkaline Phosphatase: 65 U/L (ref 39–117)
BUN: 15 mg/dL (ref 6–23)
CO2: 29 mEq/L (ref 19–32)
Calcium: 9.1 mg/dL (ref 8.4–10.5)
Chloride: 103 mEq/L (ref 96–112)
Creatinine, Ser: 1.28 mg/dL (ref 0.40–1.50)
GFR: 53.73 mL/min — ABNORMAL LOW (ref >60.00)
Glucose, Bld: 83 mg/dL (ref 70–99)
Potassium: 4.4 mEq/L (ref 3.5–5.1)
Sodium: 139 mEq/L (ref 135–145)
Total Bilirubin: 0.5 mg/dL (ref 0.2–1.2)
Total Protein: 6.9 g/dL (ref 6.0–8.3)

## 2021-08-02 LAB — LIPID PANEL
Cholesterol: 155 mg/dL (ref 0–200)
HDL: 73.4 mg/dL (ref >39.00)
LDL Cholesterol: 67 mg/dL (ref 0–99)
NonHDL: 81.19
Total CHOL/HDL Ratio: 2
Triglycerides: 73 mg/dL (ref 0.0–149.0)
VLDL: 14.6 mg/dL (ref 0.0–40.0)

## 2021-08-02 LAB — CBC WITH DIFFERENTIAL/PLATELET
Basophils Absolute: 0.1 10*3/uL (ref 0.0–0.1)
Basophils Relative: 1.1 % (ref 0.0–3.0)
Eosinophils Absolute: 0.2 10*3/uL (ref 0.0–0.7)
Eosinophils Relative: 3.1 % (ref 0.0–5.0)
HCT: 41.4 % (ref 39.0–52.0)
Hemoglobin: 13.7 g/dL (ref 13.0–17.0)
Lymphocytes Relative: 27 % (ref 12.0–46.0)
Lymphs Abs: 1.4 10*3/uL (ref 0.7–4.0)
MCHC: 33.1 g/dL (ref 30.0–36.0)
MCV: 82.2 fl (ref 78.0–100.0)
Monocytes Absolute: 0.5 10*3/uL (ref 0.1–1.0)
Monocytes Relative: 9.7 % (ref 3.0–12.0)
Neutro Abs: 3.1 10*3/uL (ref 1.4–7.7)
Neutrophils Relative %: 59.1 % (ref 43.0–77.0)
Platelets: 224 10*3/uL (ref 150.0–400.0)
RBC: 5.04 Mil/uL (ref 4.22–5.81)
RDW: 15.6 % — ABNORMAL HIGH (ref 11.5–15.5)
WBC: 5.2 10*3/uL (ref 4.0–10.5)

## 2021-08-02 LAB — HEMOGLOBIN A1C: Hgb A1c MFr Bld: 5.9 % (ref 4.6–6.5)

## 2021-08-02 LAB — VITAMIN B12: Vitamin B-12: 215 pg/mL (ref 211–911)

## 2021-08-02 LAB — TSH: TSH: 2.46 u[IU]/mL (ref 0.35–5.50)

## 2021-08-02 LAB — VITAMIN D 25 HYDROXY (VIT D DEFICIENCY, FRACTURES): VITD: 41.82 ng/mL (ref 30.00–100.00)

## 2021-08-02 NOTE — Patient Instructions (Signed)
-  Nice seeing you today!!  -Lab work today; will notify you once results are available.  -Remember your tetanus booster at the pharmacy.  -Schedule follow up in 6 months.

## 2021-08-02 NOTE — Addendum Note (Signed)
Addended by: Elmer Picker on: 08/02/2021 08:37 AM   Modules accepted: Orders

## 2021-08-02 NOTE — Progress Notes (Signed)
Established Patient Office Visit     This visit occurred during the SARS-CoV-2 public health emergency.  Safety protocols were in place, including screening questions prior to the visit, additional usage of staff PPE, and extensive cleaning of exam room while observing appropriate contact time as indicated for disinfecting solutions.    CC/Reason for Visit: Annual preventive exam and subsequent Medicare wellness visit  HPI: Anthony Skinner is a 80 y.o. male who is coming in today for the above mentioned reasons. Past Medical History is significant for: Coronary artery disease, depression, hypertension, hyperlipidemia, aortic atherosclerosis, dementia, history of prostate cancer, vitamin D and vitamin B12 deficiencies.  He has no acute concerns today.  He has moved into [facility] assisted living facility with his wife, he likes it quite well.  He gets more social interaction and physical activity.  He is overdue for Tdap booster, otherwise immunizations are up-to-date.  He has elected to no longer pursue colonoscopy due to age and comorbidities, I agree.   Past Medical/Surgical History: Past Medical History:  Diagnosis Date   Alzheimer disease ([place])    CAD (coronary artery disease)    a. reported h/o MI in the 11's;  b. 04/2000 Cath: LM nl, LAD 40p, D1 small, nl, RI nl, LCX nl, RCA nl.   Dementia (HCC)    Depression    DJD (degenerative joint disease)    Fatty liver    Gastropathy 2012   reactive   GERD (gastroesophageal reflux disease)    Hiatal hernia    Hyperlipidemia    Hypertension    Hypertension    Iron deficiency anemia    Myocardial infarction ([place])    " BACK IN THE 90'S"   Prostate cancer ([place])    a. 09/2008 s/p prostatectomy.   PUD (peptic ulcer disease)    Recurrent spontaneous pneumothorax    a. s/p L lobectomy in 1966.   Shortness of breath    Suicide attempt ([place])    a. 08/2013 attempt by hanging with subsequent resp failure   Syncope    a. in setting of  GIB in 2012, presumed to be orthostatic.   Tubular adenoma of colon 2012   Upper GI bleed    a. 2012   Vertebral artery stenosis    a. 09/2010 s/p L vertebral stenting 09/2010.    Past Surgical History:  Procedure Laterality Date   CARDIAC CATHETERIZATION  09/15/2013   CARDIAC SURGERY     CATARACT EXTRACTION Right    LEFT HEART CATHETERIZATION WITH CORONARY ANGIOGRAM N/A 09/15/2013   Procedure: LEFT HEART CATHETERIZATION WITH CORONARY ANGIOGRAM;  Surgeon: Burnell Blanks, MD;  Location: [facility] CATH LAB;  Service: Cardiovascular;  Laterality: N/A;   LUNG REMOVAL, PARTIAL  1960s   left   PROSTATECTOMY     vertebral artery stent      Social History:  reports that he quit smoking about 18 years ago. His smoking use included cigarettes. He has a 40.00 pack-year smoking history. He has never used smokeless tobacco. He reports that he does not drink alcohol and does not use drugs.  Allergies: No Known Allergies  Family History:  Family History  Problem Relation Age of Onset   Alzheimer's disease Mother    Heart attack Father    Prostate cancer Brother      Current Outpatient Medications:    amLODipine (NORVASC) 2.5 MG tablet, Take 2.5 mg by mouth daily., Disp: , Rfl:    Ascorbic Acid (VITAMIN C) 1000 MG  tablet, Take 1,000 mg by mouth daily., Disp: , Rfl:    Cholecalciferol (D3 ADULT PO), Take 250 mg by mouth., Disp: , Rfl:    docusate sodium (COLACE) 100 MG capsule, Take 1 capsule (100 mg total) by mouth every 12 (twelve) hours., Disp: 60 capsule, Rfl: 0   ondansetron (ZOFRAN ODT) 4 MG disintegrating tablet, Take 1 tablet (4 mg total) by mouth every 8 (eight) hours as needed for nausea or vomiting., Disp: 10 tablet, Rfl: 0   polyethylene glycol (MIRALAX / GLYCOLAX) 17 g packet, Take 17 g by mouth daily., Disp: 14 each, Rfl: 0   rosuvastatin (CRESTOR) 10 MG tablet, Take 10 mg by mouth at bedtime., Disp: , Rfl:    sertraline (ZOLOFT) 50 MG tablet, Take 1 tablet (50 mg total) by  mouth daily., Disp: 90 tablet, Rfl: 1  Review of Systems:  Constitutional: Denies fever, chills, diaphoresis, appetite change and fatigue.  HEENT: Denies photophobia, eye pain, redness, hearing loss, ear pain, congestion, sore throat, rhinorrhea, sneezing, mouth sores, trouble swallowing, neck pain, neck stiffness and tinnitus.   Respiratory: Denies SOB, DOE, cough, chest tightness,  and wheezing.   Cardiovascular: Denies chest pain, palpitations and leg swelling.  Gastrointestinal: Denies nausea, vomiting, abdominal pain, diarrhea, constipation, blood in stool and abdominal distention.  Genitourinary: Denies dysuria, urgency, frequency, hematuria, flank pain and difficulty urinating.  Endocrine: Denies: hot or cold intolerance, sweats, changes in hair or nails, polyuria, polydipsia. Musculoskeletal: Denies myalgias, back pain, joint swelling, arthralgias and gait problem.  Skin: Denies pallor, rash and wound.  Neurological: Denies dizziness, seizures, syncope, weakness, light-headedness, numbness and headaches.  Hematological: Denies adenopathy. Easy bruising, personal or family bleeding history  Psychiatric/Behavioral: Denies suicidal ideation, mood changes, confusion, nervousness, sleep disturbance and agitation    Physical Exam: Vitals:   08/02/21 0809  BP: 120/78  Pulse: 61  Temp: 97.7 F (36.5 C)  TempSrc: Oral  SpO2: 96%  Weight: 173 lb 8 oz (78.7 kg)  Height: 5' 11.5" (1.816 m)    Body mass index is 23.86 kg/m.   Constitutional: NAD, calm, comfortable Eyes: PERRL, lids and conjunctivae normal, wears corrective lenses ENMT: Mucous membranes are moist. Posterior pharynx clear of any exudate or lesions. Normal dentition. Tympanic membrane is pearly white, no erythema or bulging. Neck: normal, supple, no masses, no thyromegaly Respiratory: clear to auscultation bilaterally, no wheezing, no crackles. Normal respiratory effort. No accessory muscle use.  Cardiovascular:  Regular rate and rhythm, no murmurs / rubs / gallops. No extremity edema. 2+ pedal pulses. No carotid bruits.  Abdomen: no tenderness, no masses palpated. No hepatosplenomegaly. Bowel sounds positive.  Musculoskeletal: no clubbing / cyanosis. No joint deformity upper and lower extremities. Good ROM, no contractures. Normal muscle tone.  Skin: no rashes, lesions, ulcers. No induration Neurologic: CN 2-12 grossly intact. Sensation intact, DTR normal. Strength 5/5 in all 4.  Psychiatric: Normal judgment and insight. Alert and oriented x 3. Normal mood.    Subsequent Medicare wellness visit   1. Risk factors, based on past  M,S,F -cardiovascular disease risk factors include age, gender, history of hypertension, hyperlipidemia, prior coronary artery disease   2.  Physical activities: Walks on a daily basis   3.  Depression/mood: History of depression but mood is stable   4.  Hearing: No perceived issues   5.  ADL's: Independent in all ADLs   6.  Fall risk: Low fall risk   7.  Home safety: No problems identified   8.  Height weight, and  visual acuity: height and weight as above, vision:  Vision Screening   Right eye Left eye Both eyes  Without correction     With correction '20/25 20/25 20/20 '$     9.  Counseling: Advised he update his immunizations at pharmacy   10. Lab orders based on risk factors: Laboratory update will be reviewed   11. Referral : None today   12. Care plan: Follow-up with me in 6 months   13. Cognitive assessment: Mild to moderate cognitive impairment   14. Screening: Patient provided with a written and personalized 5-10 year screening schedule in the AVS. yes   15. Provider List Update: PCP only  16. Advance Directives: Full code   17. Opioids: Patient is not on any opioid prescriptions and has no risk factors for a substance use disorder.   [place] Office Visit from 08/02/2021 in [place] at [place]  PHQ-9 Total Score 2        Fall Risk  08/02/2021 02/01/2021 04/28/2020  Falls in the past year? 0 0 0  Number falls in past yr: 0 0 0  Injury with Fall? 0 0 0     Impression and Plan:  Encounter for preventive health examination -Advised routine eye and dental care. -He will get Tdap at pharmacy, otherwise immunizations are up-to-date. Screening labs today. -Healthy lifestyle discussed in detail. -He has elected to defer further cancer screening due to age and comorbidities, I agree.  Vitamin D deficiency  - Plan: VITAMIN D 25 Hydroxy (Vit-D Deficiency, Fractures)  Vitamin B 12 deficiency  - Plan: Vitamin B12  Dementia without behavioral disturbance, unspecified dementia type ([place])  -Noted. -Now at assisted living facility.  Gastroesophageal reflux disease without esophagitis -Stable, not on daily PPI therapy.  Atherosclerosis of native coronary artery of native heart without angina pectoris Aortic atherosclerosis (HCC)  - Plan: Lipid panel -Stable, no chest pain  Primary hypertension  - Plan: CBC with Differential/Platelet, Comprehensive metabolic panel -Blood pressure is well controlled.  Malignant neoplasm of prostate ([place]) -Noted.  Major depressive disorder without psychotic features (HCC) -Mood is stable on Zoloft.    Patient Instructions  -Nice seeing you today!!  -Lab work today; will notify you once results are available.  -Remember your tetanus booster at the pharmacy.  -Schedule follow up in 6 months.      Lelon Frohlich, MD [facility] at [facility]

## 2021-08-30 ENCOUNTER — Telehealth: Payer: Self-pay | Admitting: Internal Medicine

## 2021-08-30 NOTE — Telephone Encounter (Signed)
Adline Peals from the [facility] called to confirm diet orders for the PT. She can be reached at [phone] for questions and comments. She wants to know if the 2 gram of sodium diet is correct. If so was the Dr aware that he was not receiving the 2 grams of sodium. As well as if there are any concerns or negative side effects of him not receiving the 2 grams of sodium.

## 2021-08-31 NOTE — Telephone Encounter (Signed)
Dr Ledell Noss recommendation given to [facility].

## 2021-10-06 ENCOUNTER — Other Ambulatory Visit: Payer: Self-pay | Admitting: Internal Medicine

## 2021-10-06 DIAGNOSIS — F329 Major depressive disorder, single episode, unspecified: Secondary | ICD-10-CM

## 2021-10-18 ENCOUNTER — Telehealth: Payer: Self-pay | Admitting: Internal Medicine

## 2021-10-18 NOTE — Telephone Encounter (Signed)
Medicaid/Special Assistance Application to be filled out--placed in dr's folder.  Call Nathanial Millman at [phone] upon completion.

## 2021-10-19 NOTE — Telephone Encounter (Signed)
Placed in Dr Hernandez's folder 

## 2021-10-25 NOTE — Telephone Encounter (Signed)
Left message on machine for daughter - form is ready for pick up.

## 2021-10-25 NOTE — Telephone Encounter (Signed)
Patient called to follow up on forms for medicaid. Medicaid is unable to do anything without the updated FL2. Daughter would like call back when form is completed       Good number is [phone]    Please advise

## 2022-01-18 ENCOUNTER — Telehealth: Payer: Self-pay

## 2022-01-18 NOTE — Telephone Encounter (Signed)
I spoke with the pt's daughter regarding forms for [facility] senior living and informed her that our office has been trying to fax forms with no success to the fax number for [facility]. Pt's daughter stated she would pick up forms from our office during visit on 01/21/2022. Form have been placed in front office for pickup.

## 2022-01-21 ENCOUNTER — Telehealth: Payer: Self-pay | Admitting: Internal Medicine

## 2022-01-21 ENCOUNTER — Ambulatory Visit (INDEPENDENT_AMBULATORY_CARE_PROVIDER_SITE_OTHER): Payer: Medicare Other | Admitting: Internal Medicine

## 2022-01-21 VITALS — BP 138/60 | HR 71 | Temp 98.3°F | Wt 183.8 lb

## 2022-01-21 DIAGNOSIS — F329 Major depressive disorder, single episode, unspecified: Secondary | ICD-10-CM

## 2022-01-21 DIAGNOSIS — I2510 Atherosclerotic heart disease of native coronary artery without angina pectoris: Secondary | ICD-10-CM | POA: Diagnosis not present

## 2022-01-21 DIAGNOSIS — F03918 Unspecified dementia, unspecified severity, with other behavioral disturbance: Secondary | ICD-10-CM | POA: Diagnosis not present

## 2022-01-21 DIAGNOSIS — I10 Essential (primary) hypertension: Secondary | ICD-10-CM

## 2022-01-21 MED ORDER — SERTRALINE HCL 100 MG PO TABS: 100.0000 mg | ORAL_TABLET | Freq: Every day | ORAL | 1 refills | Status: DC

## 2022-01-21 MED ORDER — SERTRALINE HCL 100 MG PO TABS: 100.0000 mg | ORAL_TABLET | Freq: Every day | ORAL | 1 refills | Status: AC

## 2022-01-21 NOTE — Telephone Encounter (Signed)
Spoke with the receptionist at [facility] and was informed the [employer] is in [place].  Rx was sent and I left a message at the patient's daughter's cell number with this information.

## 2022-01-21 NOTE — Telephone Encounter (Signed)
Patient's daughter [place] called and stated that the  does not need to go to [facility] and it needs to go to [facility] [facility]. Daughter states that ever since patient and wife moved into [place], they can only use [employer] and it is in the chart. I called daughter to confirm which [employer], as I did not see it in the chart, but got no answer.     Please advise.

## 2022-01-21 NOTE — Progress Notes (Signed)
Established Patient Office Visit     This visit occurred during the SARS-CoV-2 public health emergency.  Safety protocols were in place, including screening questions prior to the visit, additional usage of staff PPE, and extensive cleaning of exam room while observing appropriate contact time as indicated for disinfecting solutions.    CC/Reason for Visit: Follow-up chronic conditions and feeling FL 2 forms  HPI: Anthony Skinner is a 81 y.o. male who is coming in today for the above mentioned reasons. Past Medical History is significant for: Coronary artery disease, depression, hypertension, hyperlipidemia, aortic atherosclerosis, Alzheimer's dementia with behavioral disturbances, history of prostate cancer as well as vitamin D and B12 deficiencies.  He will be moving into memory care and needs an updated FL 2 form with a face-to-face exam which is the main purpose of his visit today.  He also mentions that he is feeling more depressed.   Past Medical/Surgical History: Past Medical History:  Diagnosis Date   Alzheimer disease ([place])    CAD (coronary artery disease)    a. reported h/o MI in the 72's;  b. 04/2000 Cath: LM nl, LAD 40p, D1 small, nl, RI nl, LCX nl, RCA nl.   Dementia (HCC)    Depression    DJD (degenerative joint disease)    Fatty liver    Gastropathy 2012   reactive   GERD (gastroesophageal reflux disease)    Hiatal hernia    Hyperlipidemia    Hypertension    Hypertension    Iron deficiency anemia    Myocardial infarction ([place])    " BACK IN THE 90'S"   Prostate cancer ([place])    a. 09/2008 s/p prostatectomy.   PUD (peptic ulcer disease)    Recurrent spontaneous pneumothorax    a. s/p L lobectomy in 1966.   Shortness of breath    Suicide attempt ([place])    a. 08/2013 attempt by hanging with subsequent resp failure   Syncope    a. in setting of GIB in 2012, presumed to be orthostatic.   Tubular adenoma of colon 2012   Upper GI bleed    a. 2012   Vertebral  artery stenosis    a. 09/2010 s/p L vertebral stenting 09/2010.    Past Surgical History:  Procedure Laterality Date   CARDIAC CATHETERIZATION  09/15/2013   CARDIAC SURGERY     CATARACT EXTRACTION Right    LEFT HEART CATHETERIZATION WITH CORONARY ANGIOGRAM N/A 09/15/2013   Procedure: LEFT HEART CATHETERIZATION WITH CORONARY ANGIOGRAM;  Surgeon: Burnell Blanks, MD;  Location: [facility] CATH LAB;  Service: Cardiovascular;  Laterality: N/A;   LUNG REMOVAL, PARTIAL  1960s   left   PROSTATECTOMY     vertebral artery stent      Social History:  reports that he quit smoking about 19 years ago. His smoking use included cigarettes. He has a 40.00 pack-year smoking history. He has never used smokeless tobacco. He reports that he does not drink alcohol and does not use drugs.  Allergies: No Known Allergies  Family History:  Family History  Problem Relation Age of Onset   Alzheimer's disease Mother    Heart attack Father    Prostate cancer Brother      Current Outpatient Medications:    amLODipine (NORVASC) 2.5 MG tablet, Take 2.5 mg by mouth daily., Disp: , Rfl:    Ascorbic Acid (VITAMIN C) 1000 MG tablet, Take 1,000 mg by mouth daily., Disp: , Rfl:    Cholecalciferol (D3 ADULT  PO), Take 250 mg by mouth., Disp: , Rfl:    docusate sodium (COLACE) 100 MG capsule, Take 1 capsule (100 mg total) by mouth every 12 (twelve) hours., Disp: 60 capsule, Rfl: 0   ondansetron (ZOFRAN ODT) 4 MG disintegrating tablet, Take 1 tablet (4 mg total) by mouth every 8 (eight) hours as needed for nausea or vomiting., Disp: 10 tablet, Rfl: 0   polyethylene glycol (MIRALAX / GLYCOLAX) 17 g packet, Take 17 g by mouth daily., Disp: 14 each, Rfl: 0   rosuvastatin (CRESTOR) 10 MG tablet, Take 10 mg by mouth at bedtime., Disp: , Rfl:    sertraline (ZOLOFT) 100 MG tablet, Take 1 tablet (100 mg total) by mouth daily., Disp: 90 tablet, Rfl: 1  Review of Systems:  Constitutional: Denies fever, chills, diaphoresis,  appetite change and fatigue.  HEENT: Denies photophobia, eye pain, redness, hearing loss, ear pain, congestion, sore throat, rhinorrhea, sneezing, mouth sores, trouble swallowing, neck pain, neck stiffness and tinnitus.   Respiratory: Denies SOB, DOE, cough, chest tightness,  and wheezing.   Cardiovascular: Denies chest pain, palpitations and leg swelling.  Gastrointestinal: Denies nausea, vomiting, abdominal pain, diarrhea, constipation, blood in stool and abdominal distention.  Genitourinary: Denies dysuria, urgency, frequency, hematuria, flank pain and difficulty urinating.  Endocrine: Denies: hot or cold intolerance, sweats, changes in hair or nails, polyuria, polydipsia. Musculoskeletal: Denies myalgias, back pain, joint swelling, arthralgias and gait problem.  Skin: Denies pallor, rash and wound.  Neurological: Denies dizziness, seizures, syncope, weakness, light-headedness, numbness and headaches.  Hematological: Denies adenopathy. Easy bruising, personal or family bleeding history  Psychiatric/Behavioral: Denies suicidal ideation,  nervousness and agitation    Physical Exam: Vitals:   01/21/22 1408  BP: 138/60  Pulse: 71  Temp: 98.3 F (36.8 C)  TempSrc: Oral  SpO2: 96%  Weight: 183 lb 12.8 oz (83.4 kg)    Body mass index is 25.28 kg/m.   Constitutional: NAD, calm, comfortable Eyes: PERRL, lids and conjunctivae normal, wears corrective lenses ENMT: Mucous membranes are moist.  Respiratory: clear to auscultation bilaterally, no wheezing, no crackles. Normal respiratory effort. No accessory muscle use.  Cardiovascular: Regular rate and rhythm, no murmurs / rubs / gallops. No extremity edema.    Impression and Plan:  Major depressive disorder without psychotic features ([place])  - Plan: sertraline (ZOLOFT) 100 MG tablet  Flowsheet Row Office Visit from 01/21/2022 in [place] at [facility]  PHQ-9 Total Score 6      -He has been noticing increased depression, I  will go ahead and increase his sertraline from 50 to 100 mg and have advised follow-up in 8 to 12 weeks.  -He has no chest pain, I will fill FL 2 forms for memory care placement.  He continues to follow with cardiology as scheduled  Time spent: 32 minutes reviewing chart, interviewing and examining patient, formulating plan of care and filling out FL 2 form     Berea Majkowski Isaac Bliss, MD [facility]

## 2022-02-04 ENCOUNTER — Ambulatory Visit: Payer: Medicare Other | Admitting: Internal Medicine

## 2022-06-21 DIAGNOSIS — H5213 Myopia, bilateral: Secondary | ICD-10-CM | POA: Diagnosis not present

## 2022-06-21 DIAGNOSIS — H43813 Vitreous degeneration, bilateral: Secondary | ICD-10-CM | POA: Diagnosis not present

## 2022-07-31 ENCOUNTER — Encounter: Payer: Self-pay | Admitting: Internal Medicine

## 2022-08-28 ENCOUNTER — Encounter: Payer: Medicare Other | Admitting: Internal Medicine

## 2022-08-29 ENCOUNTER — Ambulatory Visit (INDEPENDENT_AMBULATORY_CARE_PROVIDER_SITE_OTHER): Payer: Medicare Other | Admitting: Internal Medicine

## 2022-08-29 ENCOUNTER — Encounter: Payer: Self-pay | Admitting: Internal Medicine

## 2022-08-29 ENCOUNTER — Telehealth: Payer: Self-pay | Admitting: Internal Medicine

## 2022-08-29 VITALS — BP 171/79 | HR 65 | Temp 97.7°F | Ht 72.0 in | Wt 181.8 lb

## 2022-08-29 DIAGNOSIS — Z0000 Encounter for general adult medical examination without abnormal findings: Secondary | ICD-10-CM

## 2022-08-29 DIAGNOSIS — E538 Deficiency of other specified B group vitamins: Secondary | ICD-10-CM

## 2022-08-29 DIAGNOSIS — F03A0 Unspecified dementia, mild, without behavioral disturbance, psychotic disturbance, mood disturbance, and anxiety: Secondary | ICD-10-CM

## 2022-08-29 DIAGNOSIS — I10 Essential (primary) hypertension: Secondary | ICD-10-CM

## 2022-08-29 DIAGNOSIS — Z0001 Encounter for general adult medical examination with abnormal findings: Secondary | ICD-10-CM | POA: Diagnosis not present

## 2022-08-29 DIAGNOSIS — Z23 Encounter for immunization: Secondary | ICD-10-CM | POA: Diagnosis not present

## 2022-08-29 DIAGNOSIS — F329 Major depressive disorder, single episode, unspecified: Secondary | ICD-10-CM

## 2022-08-29 DIAGNOSIS — E559 Vitamin D deficiency, unspecified: Secondary | ICD-10-CM

## 2022-08-29 DIAGNOSIS — H6123 Impacted cerumen, bilateral: Secondary | ICD-10-CM

## 2022-08-29 DIAGNOSIS — I700 Atherosclerosis of aorta: Secondary | ICD-10-CM | POA: Diagnosis not present

## 2022-08-29 LAB — COMPREHENSIVE METABOLIC PANEL
ALT: 13 U/L (ref 0–53)
AST: 20 U/L (ref 0–37)
Albumin: 4.3 g/dL (ref 3.5–5.2)
Alkaline Phosphatase: 69 U/L (ref 39–117)
BUN: 16 mg/dL (ref 6–23)
CO2: 26 mEq/L (ref 19–32)
Calcium: 9.7 mg/dL (ref 8.4–10.5)
Chloride: 104 mEq/L (ref 96–112)
Creatinine, Ser: 1.42 mg/dL (ref 0.40–1.50)
GFR: 47.09 mL/min — ABNORMAL LOW (ref >60.00)
Glucose, Bld: 83 mg/dL (ref 70–99)
Potassium: 4.6 mEq/L (ref 3.5–5.1)
Sodium: 140 mEq/L (ref 135–145)
Total Bilirubin: 0.5 mg/dL (ref 0.2–1.2)
Total Protein: 7.9 g/dL (ref 6.0–8.3)

## 2022-08-29 LAB — CBC WITH DIFFERENTIAL/PLATELET
Basophils Absolute: 0.1 10*3/uL (ref 0.0–0.1)
Basophils Relative: 1 % (ref 0.0–3.0)
Eosinophils Absolute: 0.2 10*3/uL (ref 0.0–0.7)
Eosinophils Relative: 2.3 % (ref 0.0–5.0)
HCT: 43.5 % (ref 39.0–52.0)
Hemoglobin: 14.4 g/dL (ref 13.0–17.0)
Lymphocytes Relative: 16.7 % (ref 12.0–46.0)
Lymphs Abs: 1.2 10*3/uL (ref 0.7–4.0)
MCHC: 33 g/dL (ref 30.0–36.0)
MCV: 82.7 fl (ref 78.0–100.0)
Monocytes Absolute: 0.6 10*3/uL (ref 0.1–1.0)
Monocytes Relative: 8.8 % (ref 3.0–12.0)
Neutro Abs: 5 10*3/uL (ref 1.4–7.7)
Neutrophils Relative %: 71.2 % (ref 43.0–77.0)
Platelets: 272 10*3/uL (ref 150.0–400.0)
RBC: 5.26 Mil/uL (ref 4.22–5.81)
RDW: 15.4 % (ref 11.5–15.5)
WBC: 7 10*3/uL (ref 4.0–10.5)

## 2022-08-29 LAB — VITAMIN D 25 HYDROXY (VIT D DEFICIENCY, FRACTURES): VITD: 30.29 ng/mL (ref 30.00–100.00)

## 2022-08-29 LAB — LIPID PANEL
Cholesterol: 166 mg/dL (ref 0–200)
HDL: 70.8 mg/dL (ref >39.00)
LDL Cholesterol: 81 mg/dL (ref 0–99)
NonHDL: 95.53
Total CHOL/HDL Ratio: 2
Triglycerides: 73 mg/dL (ref 0.0–149.0)
VLDL: 14.6 mg/dL (ref 0.0–40.0)

## 2022-08-29 LAB — HEMOGLOBIN A1C: Hgb A1c MFr Bld: 5.9 % (ref 4.6–6.5)

## 2022-08-29 LAB — VITAMIN B12: Vitamin B-12: 255 pg/mL (ref 211–911)

## 2022-08-29 LAB — TSH: TSH: 2.76 u[IU]/mL (ref 0.35–5.50)

## 2022-08-29 MED ORDER — AMLODIPINE BESYLATE 5 MG PO TABS: 5.0000 mg | ORAL_TABLET | Freq: Every day | ORAL | 1 refills | Status: DC

## 2022-08-29 MED ORDER — AMLODIPINE BESYLATE 5 MG PO TABS: 5.0000 mg | ORAL_TABLET | Freq: Every day | ORAL | 1 refills | Status: AC

## 2022-08-29 NOTE — Progress Notes (Signed)
Established Patient Office Visit     CC/Reason for Visit: Annual preventive exam and subsequent Medicare wellness visit  HPI: Anthony Skinner is a 81 y.o. male who is coming in today for the above mentioned reasons. Past Medical History is significant for: Depression, hyperlipidemia, known aortic atherosclerosis, hypertension, mild Alzheimer's dementia, prostate cancer, vitamin D and B12 deficiencies.  He is now at [facility].  He is doing quite well.  He has no natural teeth, he had an eye exam earlier this year.  Complains of decreased hearing bilaterally.  Is overdue for bivalent COVID, flu, Tdap vaccines.  He no longer does cancer screening due to age and comorbidities.   Past Medical/Surgical History: Past Medical History:  Diagnosis Date   Alzheimer disease ([place])    CAD (coronary artery disease)    a. reported h/o MI in the 8's;  b. 04/2000 Cath: LM nl, LAD 40p, D1 small, nl, RI nl, LCX nl, RCA nl.   Dementia (HCC)    Depression    DJD (degenerative joint disease)    Fatty liver    Gastropathy 2012   reactive   GERD (gastroesophageal reflux disease)    Hiatal hernia    Hyperlipidemia    Hypertension    Hypertension    Iron deficiency anemia    Myocardial infarction ([place])    " BACK IN THE 90'S"   Prostate cancer ([place])    a. 09/2008 s/p prostatectomy.   PUD (peptic ulcer disease)    Recurrent spontaneous pneumothorax    a. s/p L lobectomy in 1966.   Shortness of breath    Suicide attempt ([place])    a. 08/2013 attempt by hanging with subsequent resp failure   Syncope    a. in setting of GIB in 2012, presumed to be orthostatic.   Tubular adenoma of colon 2012   Upper GI bleed    a. 2012   Vertebral artery stenosis    a. 09/2010 s/p L vertebral stenting 09/2010.    Past Surgical History:  Procedure Laterality Date   CARDIAC CATHETERIZATION  09/15/2013   CARDIAC SURGERY     CATARACT EXTRACTION Right    LEFT HEART CATHETERIZATION WITH CORONARY  ANGIOGRAM N/A 09/15/2013   Procedure: LEFT HEART CATHETERIZATION WITH CORONARY ANGIOGRAM;  Surgeon: Burnell Blanks, MD;  Location: [facility] CATH LAB;  Service: Cardiovascular;  Laterality: N/A;   LUNG REMOVAL, PARTIAL  1960s   left   PROSTATECTOMY     vertebral artery stent      Social History:  reports that he quit smoking about 20 years ago. His smoking use included cigarettes. He has a 40.00 pack-year smoking history. He has never used smokeless tobacco. He reports that he does not drink alcohol and does not use drugs.  Allergies: No Known Allergies  Family History:  Family History  Problem Relation Age of Onset   Alzheimer's disease Mother    Heart attack Father    Prostate cancer Brother      Current Outpatient Medications:    amLODipine (NORVASC) 5 MG tablet, Take 1 tablet (5 mg total) by mouth daily., Disp: 90 tablet, Rfl: 1   Ascorbic Acid (VITAMIN C) 1000 MG tablet, Take 1,000 mg by mouth daily., Disp: , Rfl:    Cholecalciferol (D3 ADULT PO), Take 250 mg by mouth., Disp: , Rfl:    docusate sodium (COLACE) 100 MG capsule, Take 1 capsule (100 mg total) by mouth every 12 (twelve) hours., Disp: 60 capsule, Rfl:  0   ondansetron (ZOFRAN ODT) 4 MG disintegrating tablet, Take 1 tablet (4 mg total) by mouth every 8 (eight) hours as needed for nausea or vomiting., Disp: 10 tablet, Rfl: 0   polyethylene glycol (MIRALAX / GLYCOLAX) 17 g packet, Take 17 g by mouth daily., Disp: 14 each, Rfl: 0   rosuvastatin (CRESTOR) 10 MG tablet, Take 10 mg by mouth at bedtime., Disp: , Rfl:    sertraline (ZOLOFT) 100 MG tablet, Take 1 tablet (100 mg total) by mouth daily., Disp: 90 tablet, Rfl: 1  Review of Systems:  Constitutional: Denies fever, chills, diaphoresis, appetite change and fatigue.  HEENT: Denies photophobia, eye pain, redness, hearing loss, ear pain, congestion, sore throat, rhinorrhea, sneezing, mouth sores, trouble swallowing, neck pain, neck stiffness and tinnitus.   Respiratory:  Denies SOB, DOE, cough, chest tightness,  and wheezing.   Cardiovascular: Denies chest pain, palpitations and leg swelling.  Gastrointestinal: Denies nausea, vomiting, abdominal pain, diarrhea, constipation, blood in stool and abdominal distention.  Genitourinary: Denies dysuria, urgency, frequency, hematuria, flank pain and difficulty urinating.  Endocrine: Denies: hot or cold intolerance, sweats, changes in hair or nails, polyuria, polydipsia. Musculoskeletal: Denies myalgias, back pain, joint swelling, arthralgias and gait problem.  Skin: Denies pallor, rash and wound.  Neurological: Denies dizziness, seizures, syncope, weakness, light-headedness, numbness and headaches.  Hematological: Denies adenopathy. Easy bruising, personal or family bleeding history  Psychiatric/Behavioral: Denies suicidal ideation, mood changes, confusion, nervousness, sleep disturbance and agitation    Physical Exam: Vitals:   08/29/22 0845 08/29/22 0850  BP: (!) 180/82 (!) 171/79  Pulse: 65   Temp: 97.7 F (36.5 C)   TempSrc: Oral   SpO2: 98%   Weight: 181 lb 12.8 oz (82.5 kg)   Height: 6' (1.829 m)     Body mass index is 24.66 kg/m.   Constitutional: NAD, calm, comfortable Eyes: PERRL, lids and conjunctivae normal, wears corrective lenses ENMT: Mucous membranes are moist. Posterior pharynx clear of any exudate or lesions. Normal dentition. Tympanic membrane is pearly white, no erythema or bulging. Neck: normal, supple, no masses, no thyromegaly Respiratory: clear to auscultation bilaterally, no wheezing, no crackles. Normal respiratory effort. No accessory muscle use.  Cardiovascular: Regular rate and rhythm, no murmurs / rubs / gallops. No extremity edema. 2+ pedal pulses. No carotid bruits.  Abdomen: no tenderness, no masses palpated. No hepatosplenomegaly. Bowel sounds positive.  Musculoskeletal: no clubbing / cyanosis. No joint deformity upper and lower extremities. Good ROM, no contractures.  Normal muscle tone.  Skin: no rashes, lesions, ulcers. No induration Neurologic: CN 2-12 grossly intact. Sensation intact, DTR normal. Strength 5/5 in all 4.  Psychiatric: Normal judgment and insight. Alert and oriented x 3. Normal mood.    Subsequent Medicare wellness visit   1. Risk factors, based on past  M,S,F -cardiovascular disease risk factors include age, gender, known history of hypertension, hyperlipidemia and aortic atherosclerosis   2.  Physical activities: Remains very active at his memory care unit   3.  Depression/mood: Has a history of depression but mood is stable   4.  Hearing: Decreased hearing bilaterally   5.  ADL's: Remains independent in most ADLs   6.  Fall risk: Low fall risk   7.  Home safety: No problems identified   8.  Height weight, and visual acuity: height and weight as above, vision:  Vision Screening   Right eye Left eye Both eyes  Without correction     With correction 20/25 20/25 20/25  9.  Counseling: Advised to update age-appropriate vaccinations   10. Lab orders based on risk factors: Laboratory update will be reviewed   11. Referral : None today   12. Care plan: Follow-up with me in 3 months for blood pressure management   13. Cognitive assessment: Mild cognitive impairment   14. Screening: Patient provided with a written and personalized 5-10 year screening schedule in the AVS. yes   15. Provider List Update: PCP only  16. Advance Directives: Full code   17. Opioids: Patient is not on any opioid prescriptions and has no risk factors for a substance use disorder.   [place] Office Visit from 01/21/2022 in [place] at [place]  PHQ-9 Total Score 6          04/28/2020   10:51 AM 02/01/2021   10:53 AM 04/07/2021   10:25 AM 08/02/2021    8:08 AM 01/21/2022    2:04 PM  Fall Risk  Falls in the past year? 0 0  0 0  Was there an injury with Fall? 0 0  0 0  Fall Risk Category Calculator 0 0  0 0  Fall Risk  Category Low Low  Low Low  Patient Fall Risk Level   Low fall risk  Low fall risk  Patient at Risk for Falls Due to     No Fall Risks  Fall risk Follow up     Falls evaluation completed     Impression and Plan:  Encounter for preventive health examination  Need for influenza vaccination  Bilateral hearing loss due to cerumen impaction  Vitamin B 12 deficiency - Plan: Vitamin B12  Vitamin D deficiency - Plan: VITAMIN D 25 Hydroxy (Vit-D Deficiency, Fractures)  Primary hypertension - Plan: CBC with Differential/Platelet, Comprehensive metabolic panel, amLODipine (NORVASC) 5 MG tablet  Mild dementia without behavioral disturbance, psychotic disturbance, mood disturbance, or anxiety, unspecified dementia type ([place]) - Plan: Hemoglobin A1c  Major depressive disorder without psychotic features ([place]) - Plan: TSH  Aortic atherosclerosis ([place]) - Plan: Lipid panel  -Recommend routine eye and dental care. -Immunizations: Flu vaccine in office today, advised to get Tdap and bivalent COVID at pharmacy -Healthy lifestyle discussed in detail. -Labs to be updated today. -Colon cancer screening: No further due to age -Breast cancer screening: Not applicable -Cervical cancer screening: Not applicable -Lung cancer screening: Not applicable -Prostate cancer screening: History of prostate cancer, not applicable -DEXA: Not applicable  -Flu vaccine was administered in office today. -His only antihypertensive is amlodipine 2.5 mg daily, his blood pressure is quite elevated today I will go ahead and increase to 5 mg and have him return in 3 months for follow-up.    Patient Instructions  -Nice seeing you today!!  -Lab work today; will notify you once results are available.  -Flu vaccine today. Remember COVID and tdap vaccines at pharmacy.  -Increase amlodipine to 5 mg daily.  -Schedule follow up in 3 months to follow on your BP.      Lelon Frohlich, MD [facility] at  [facility]

## 2022-08-29 NOTE — Addendum Note (Signed)
Addended by: Westley Hummer B on: 08/29/2022 10:43 AM   Modules accepted: Orders

## 2022-08-29 NOTE — Patient Instructions (Signed)
-  Nice seeing you today!!  -Lab work today; will notify you once results are available.  -Flu vaccine today. Remember COVID and tdap vaccines at pharmacy.  -Increase amlodipine to 5 mg daily.  -Schedule follow up in 3 months to follow on your BP.

## 2022-08-29 NOTE — Telephone Encounter (Signed)
error 

## 2022-08-29 NOTE — Addendum Note (Signed)
Addended by: Westley Hummer B on: 08/29/2022 10:57 AM   Modules accepted: Orders

## 2022-09-02 ENCOUNTER — Encounter: Payer: Self-pay | Admitting: Internal Medicine

## 2022-09-03 NOTE — Addendum Note (Signed)
Addended by: Elza Rafter D on: 09/03/2022 04:34 PM   Modules accepted: Orders

## 2022-09-03 NOTE — Telephone Encounter (Signed)
Called daughter to confirm medication list.  FL-2 form to providers desk for review & signature.  Anthony Skinner would like to be called to pick up FL-2 forms when complete.

## 2022-09-06 ENCOUNTER — Telehealth: Payer: Self-pay | Admitting: Internal Medicine

## 2022-09-06 NOTE — Telephone Encounter (Signed)
Social Security forms for nursing home to be filled out--placed in dr's folder.  Call Nathanial Millman at [phone] upon completion--she will hand-deliver the forms.

## 2022-09-11 NOTE — Telephone Encounter (Signed)
Forms are ready to be picked up, and the daughter is aware.

## 2022-10-24 ENCOUNTER — Encounter: Payer: Self-pay | Admitting: Family Medicine

## 2022-10-24 ENCOUNTER — Telehealth: Payer: Self-pay | Admitting: Internal Medicine

## 2022-10-24 ENCOUNTER — Ambulatory Visit (INDEPENDENT_AMBULATORY_CARE_PROVIDER_SITE_OTHER): Payer: Medicare Other | Admitting: Family Medicine

## 2022-10-24 VITALS — BP 138/64 | HR 66 | Temp 98.1°F | Wt 182.0 lb

## 2022-10-24 DIAGNOSIS — L089 Local infection of the skin and subcutaneous tissue, unspecified: Secondary | ICD-10-CM | POA: Diagnosis not present

## 2022-10-24 DIAGNOSIS — T148XXA Other injury of unspecified body region, initial encounter: Secondary | ICD-10-CM | POA: Diagnosis not present

## 2022-10-24 DIAGNOSIS — W19XXXA Unspecified fall, initial encounter: Secondary | ICD-10-CM | POA: Diagnosis not present

## 2022-10-24 DIAGNOSIS — S51012A Laceration without foreign body of left elbow, initial encounter: Secondary | ICD-10-CM

## 2022-10-24 DIAGNOSIS — Z23 Encounter for immunization: Secondary | ICD-10-CM | POA: Diagnosis not present

## 2022-10-24 MED ORDER — AMOXICILLIN 500 MG PO TABS: 500.0000 mg | ORAL_TABLET | Freq: Two times a day (BID) | ORAL | 0 refills | Status: AC

## 2022-10-24 NOTE — Progress Notes (Signed)
Subjective:    Patient ID: Anthony Skinner, male    DOB: [birth year], 81 y.o.   MRN: [id number]  Chief Complaint  Patient presents with   Wound Check    Resides in [facility], stated they want him to get the scrape looked at. Scraped it Sunday night. Michela Pitcher it has a little ordor to it   Pt accompanied by his daughter.  HPI Patient was seen today for acute concern.  Pt fell Sunday night, scraping L elbow against the rug when landing on the floor.  Pt's daughter states the med director at [facility] felt the area was infected b/c it had an odor.   Pt denies odor or drainage.  Past Medical History:  Diagnosis Date   Alzheimer disease ([place])    CAD (coronary artery disease)    a. reported h/o MI in the 35's;  b. 04/2000 Cath: LM nl, LAD 40p, D1 small, nl, RI nl, LCX nl, RCA nl.   Dementia (HCC)    Depression    DJD (degenerative joint disease)    Fatty liver    Gastropathy 2012   reactive   GERD (gastroesophageal reflux disease)    Hiatal hernia    Hyperlipidemia    Hypertension    Hypertension    Iron deficiency anemia    Myocardial infarction ([place])    " BACK IN THE 90'S"   Prostate cancer ([place])    a. 09/2008 s/p prostatectomy.   PUD (peptic ulcer disease)    Recurrent spontaneous pneumothorax    a. s/p L lobectomy in 1966.   Shortness of breath    Suicide attempt ([place])    a. 08/2013 attempt by hanging with subsequent resp failure   Syncope    a. in setting of GIB in 2012, presumed to be orthostatic.   Tubular adenoma of colon 2012   Upper GI bleed    a. 2012   Vertebral artery stenosis    a. 09/2010 s/p L vertebral stenting 09/2010.    No Known Allergies  ROS General: Denies fever, chills, night sweats, changes in weight, changes in appetite HEENT: Denies headaches, ear pain, changes in vision, rhinorrhea, sore throat CV: Denies CP, palpitations, SOB, orthopnea Pulm: Denies SOB, cough, wheezing GI: Denies abdominal pain, nausea, vomiting, diarrhea, constipation GU: Denies  dysuria, hematuria, frequency Msk: Denies muscle cramps, joint pains Neuro: Denies weakness, numbness, tingling Skin: Denies rashes, bruising + abrasion left elbow Psych: Denies depression, anxiety, hallucinations     Objective:    Blood pressure 138/64, pulse 66, temperature 98.1 F (36.7 C), temperature source Oral, weight 182 lb (82.6 kg), SpO2 95 %.   Gen. Pleasant, well-nourished, in no distress, normal affect   HEENT: [place]/AT, face symmetric, conjunctiva clear, no scleral icterus, PERRLA, EOMI, nares patent without drainage Lungs: no accessory muscle use Cardiovascular: RRR, no peripheral edema Neuro:  A&Ox3, CN II-XII intact, normal gait MSK: Normal ROM of left elbow, left shoulder, left hand. Skin:  Warm, no lesions/ rash  Skin flap on L elbow in place with mild erythema surrounding abrasion marked with skin marker. No bleeding, drainage, odor.  Rounded rectangular area of erythema surrounding skin flap.   Wt Readings from Last 3 Encounters:  10/24/22 182 lb (82.6 kg)  08/29/22 181 lb 12.8 oz (82.5 kg)  01/21/22 183 lb 12.8 oz (83.4 kg)    Lab Results  Component Value Date   WBC 7.0 08/29/2022   HGB 14.4 08/29/2022   HCT 43.5 08/29/2022   PLT 272.0 08/29/2022  GLUCOSE 83 08/29/2022   CHOL 166 08/29/2022   TRIG 73.0 08/29/2022   HDL 70.80 08/29/2022   LDLCALC 81 08/29/2022   ALT 13 08/29/2022   AST 20 08/29/2022   NA 140 08/29/2022   K 4.6 08/29/2022   CL 104 08/29/2022   CREATININE 1.42 08/29/2022   BUN 16 08/29/2022   CO2 26 08/29/2022   TSH 2.76 08/29/2022   INR 1.11 09/15/2013   HGBA1C 5.9 08/29/2022    Assessment/Plan:  Skin tear of left elbow without complication, initial encounter  - Plan: Tdap vaccine greater than or equal to 7yo IM, amoxicillin (AMOXIL) 500 MG tablet  Fall from standing, initial encounter  Wound infection  - Plan: amoxicillin (AMOXIL) 500 MG tablet  Need for Tdap -Plan: Tdap vaccine greater than or equal to 7yo  IM  Healing abrasion with skin tear of left lateral elbow secondary to fall 5 days ago.  Discussed keeping area clean and dry.  Would avoid occlusive bandages previously used as caused skin irritation-surrounding rectangular shaped line of erythema.  Area of erythema surrounding actual skin tear marked with skin marker.  Okay to use sterile gauze and paper tape to cover wound as needed.  No recent Tdap noted upon chart review.  Tdap given this visit.  Allergies reviewed.  Rx for amoxicillin sent to patient's pharmacy given reports of malodorous drainage per staff.  Discussed s/s of worsening symptoms/infection.  Given handouts.  F/u as needed  Grier Mitts, MD

## 2022-10-24 NOTE — Telephone Encounter (Signed)
Rowena with [facility] [phone]  Pt had a fall on Sunday Left arm scraped & infected   Can they have a wound care order thru home health service?  Please advise.  Fax: [phone]

## 2022-10-24 NOTE — Telephone Encounter (Signed)
Please disregard previous message.  Daughter in law called and would rather bring Pt in.  They will be here at 4pm to see Dr. Volanda Napoleon.

## 2022-10-24 NOTE — Patient Instructions (Addendum)
Continue to keep wound clean and dry.  Avoid scrubbing or picking at the area.  Avoid Band-Aids with adhesive on the way around as it seems to be causing irritation to your skin.  A dressing such as sterile gauze with paper tape can be applied to your left elbow to cover the line when needed.  A prescription for amoxicillin was sent to your pharmacy.  Monitor for signs and symptoms of infection including increased redness, drainage, pain, fever, chills, swelling, etc.

## 2022-11-28 ENCOUNTER — Ambulatory Visit (INDEPENDENT_AMBULATORY_CARE_PROVIDER_SITE_OTHER): Payer: Medicare Other | Admitting: Internal Medicine

## 2022-11-28 ENCOUNTER — Encounter: Payer: Self-pay | Admitting: Internal Medicine

## 2022-11-28 VITALS — BP 163/77 | HR 70 | Temp 97.6°F | Wt 179.9 lb

## 2022-11-28 DIAGNOSIS — I10 Essential (primary) hypertension: Secondary | ICD-10-CM | POA: Diagnosis not present

## 2022-11-28 MED ORDER — VALSARTAN-HYDROCHLOROTHIAZIDE 80-12.5 MG PO TABS: 1.0000 | ORAL_TABLET | Freq: Every day | ORAL | 1 refills | Status: AC

## 2022-11-28 MED ORDER — VALSARTAN-HYDROCHLOROTHIAZIDE 80-12.5 MG PO TABS: 1.0000 | ORAL_TABLET | Freq: Every day | ORAL | 1 refills | Status: DC

## 2022-11-28 NOTE — Progress Notes (Signed)
Established Patient Office Visit     CC/Reason for Visit: BP follow up  HPI: Anthony Skinner is a 81 y.o. male who is coming in today for the above mentioned reasons. Here today for BP follow up. BP has remained elevated despite increasing amlodipine from 2.5 to 5 mg. He is otherwise doing well and has no acute concerns or complaints.   Past Medical/Surgical History: Past Medical History:  Diagnosis Date   Alzheimer disease ([place])    CAD (coronary artery disease)    a. reported h/o MI in the 75's;  b. 04/2000 Cath: LM nl, LAD 40p, D1 small, nl, RI nl, LCX nl, RCA nl.   Dementia (HCC)    Depression    DJD (degenerative joint disease)    Fatty liver    Gastropathy 2012   reactive   GERD (gastroesophageal reflux disease)    Hiatal hernia    Hyperlipidemia    Hypertension    Hypertension    Iron deficiency anemia    Myocardial infarction ([place])    " BACK IN THE 90'S"   Prostate cancer ([place])    a. 09/2008 s/p prostatectomy.   PUD (peptic ulcer disease)    Recurrent spontaneous pneumothorax    a. s/p L lobectomy in 1966.   Shortness of breath    Suicide attempt ([place])    a. 08/2013 attempt by hanging with subsequent resp failure   Syncope    a. in setting of GIB in 2012, presumed to be orthostatic.   Tubular adenoma of colon 2012   Upper GI bleed    a. 2012   Vertebral artery stenosis    a. 09/2010 s/p L vertebral stenting 09/2010.    Past Surgical History:  Procedure Laterality Date   CARDIAC CATHETERIZATION  09/15/2013   CARDIAC SURGERY     CATARACT EXTRACTION Right    LEFT HEART CATHETERIZATION WITH CORONARY ANGIOGRAM N/A 09/15/2013   Procedure: LEFT HEART CATHETERIZATION WITH CORONARY ANGIOGRAM;  Surgeon: Burnell Blanks, MD;  Location[facility] CATH LAB;  Service: Cardiovascular;  Laterality: N/A;   LUNG REMOVAL, PARTIAL  1960s   left   PROSTATECTOMY     vertebral artery stent      Social History:  reports that he quit smoking about 20 years ago. His  smoking use included cigarettes. He has a 40.00 pack-year smoking history. He has never used smokeless tobacco. He reports that he does not drink alcohol and does not use drugs.  Allergies: No Known Allergies  Family History:  Family History  Problem Relation Age of Onset   Alzheimer's disease Mother    Heart attack Father    Prostate cancer Brother      Current Outpatient Medications:    amLODipine (NORVASC) 5 MG tablet, Take 1 tablet (5 mg total) by mouth daily., Disp: 90 tablet, Rfl: 1   Ascorbic Acid (VITAMIN C) 1000 MG tablet, Take 1,000 mg by mouth daily., Disp: , Rfl:    Cholecalciferol (D3 ADULT PO), Take 250 mg by mouth., Disp: , Rfl:    docusate sodium (COLACE) 100 MG capsule, Take 1 capsule (100 mg total) by mouth every 12 (twelve) hours., Disp: 60 capsule, Rfl: 0   ondansetron (ZOFRAN ODT) 4 MG disintegrating tablet, Take 1 tablet (4 mg total) by mouth every 8 (eight) hours as needed for nausea or vomiting., Disp: 10 tablet, Rfl: 0   polyethylene glycol (MIRALAX / GLYCOLAX) 17 g packet, Take 17 g by mouth daily., Disp: 14 each,  Rfl: 0   sertraline (ZOLOFT) 100 MG tablet, Take 1 tablet (100 mg total) by mouth daily., Disp: 90 tablet, Rfl: 1   valsartan-hydrochlorothiazide (DIOVAN-HCT) 80-12.5 MG tablet, Take 1 tablet by mouth daily., Disp: 90 tablet, Rfl: 1  Review of Systems:  Constitutional: Denies fever, chills, diaphoresis, appetite change and fatigue.  HEENT: Denies photophobia, eye pain, redness, hearing loss, ear pain, congestion, sore throat, rhinorrhea, sneezing, mouth sores, trouble swallowing, neck pain, neck stiffness and tinnitus.   Respiratory: Denies SOB, DOE, cough, chest tightness,  and wheezing.   Cardiovascular: Denies chest pain, palpitations and leg swelling.  Gastrointestinal: Denies nausea, vomiting, abdominal pain, diarrhea, constipation, blood in stool and abdominal distention.  Genitourinary: Denies dysuria, urgency, frequency, hematuria, flank pain  and difficulty urinating.  Endocrine: Denies: hot or cold intolerance, sweats, changes in hair or nails, polyuria, polydipsia. Musculoskeletal: Denies myalgias, back pain, joint swelling, arthralgias and gait problem.  Skin: Denies pallor, rash and wound.  Neurological: Denies dizziness, seizures, syncope, weakness, light-headedness, numbness and headaches.  Hematological: Denies adenopathy. Easy bruising, personal or family bleeding history  Psychiatric/Behavioral: Denies suicidal ideation, mood changes, confusion, nervousness, sleep disturbance and agitation    Physical Exam: Vitals:   11/28/22 1112 11/28/22 1115  BP: (!) 150/84 (!) 163/77  Pulse: 70   Temp: 97.6 F (36.4 C)   TempSrc: Oral   SpO2: 94%   Weight: 179 lb 14.4 oz (81.6 kg)     Body mass index is 24.4 kg/m.   Constitutional: NAD, calm, comfortable Eyes: PERRL, lids and conjunctivae normal, wears corrective lenses. ENMT: Mucous membranes are moist.  Respiratory: clear to auscultation bilaterally, no wheezing, no crackles. Normal respiratory effort. No accessory muscle use.  Cardiovascular: Regular rate and rhythm, no murmurs / rubs / gallops. No extremity edema.  Psychiatric: Normal judgment and insight. Alert and oriented x 3. Normal mood.    Impression and Plan:  Primary hypertension - Plan: valsartan-hydrochlorothiazide (DIOVAN-HCT) 80-12.5 MG tablet  -BP remains uncontrolled on norvasc 5 mg. Add valsartan HCT 80/12.5 mg and return in 6-8 weeks for follow up.  Time spent:30 minutes reviewing chart, interviewing and examining patient and formulating plan of care.       Lelon Frohlich, MD [facility] at [facility]

## 2023-07-22 ENCOUNTER — Telehealth: Payer: Self-pay | Admitting: Internal Medicine

## 2023-07-22 NOTE — Telephone Encounter (Signed)
Error/njr °

## 2023-07-23 ENCOUNTER — Ambulatory Visit (INDEPENDENT_AMBULATORY_CARE_PROVIDER_SITE_OTHER): Payer: 59 | Admitting: Internal Medicine

## 2023-07-23 VITALS — BP 138/65 | HR 66 | Temp 97.4°F | Wt 173.5 lb

## 2023-07-23 DIAGNOSIS — M545 Low back pain, unspecified: Secondary | ICD-10-CM | POA: Diagnosis not present

## 2023-07-23 LAB — POC URINALSYSI DIPSTICK (AUTOMATED)
Bilirubin, UA: NEGATIVE
Blood, UA: NEGATIVE
Glucose, UA: NEGATIVE
Ketones, UA: NEGATIVE
Leukocytes, UA: NEGATIVE
Nitrite, UA: NEGATIVE
Protein, UA: NEGATIVE
Spec Grav, UA: 1.01 (ref 1.010–1.025)
Urobilinogen, UA: 0.2 E.U./dL
pH, UA: 6 (ref 5.0–8.0)

## 2023-07-23 MED ORDER — CYCLOBENZAPRINE HCL 5 MG PO TABS: 5.0000 mg | ORAL_TABLET | Freq: Every evening | ORAL | 0 refills | Status: DC | PRN

## 2023-07-23 MED ORDER — MELOXICAM 7.5 MG PO TABS: 7.5000 mg | ORAL_TABLET | Freq: Every day | ORAL | 0 refills | Status: AC

## 2023-07-23 NOTE — Progress Notes (Signed)
Established Patient Office Visit     CC/Reason for Visit: Left lower back pain  HPI: Anthony Skinner is a 82 y.o. male who is coming in today for the above mentioned reasons.  For the past 3 days he has been experiencing left lower back pain.  He almost fell out of bed and twisted his torso to grab onto the railing and has had pain ever since.  No urinary symptoms.  No radiculopathy, no saddle anesthesia, no bowel/bladder incontinence.   Past Medical/Surgical History: Past Medical History:  Diagnosis Date   Alzheimer disease (HCC)    CAD (coronary artery disease)    a. reported h/o MI in the 29's;  b. 04/2000 Cath: LM nl, LAD 40p, D1 small, nl, RI nl, LCX nl, RCA nl.   Dementia (HCC)    Depression    DJD (degenerative joint disease)    Fatty liver    Gastropathy 2012   reactive   GERD (gastroesophageal reflux disease)    Hiatal hernia    Hyperlipidemia    Hypertension    Hypertension    Iron deficiency anemia    Myocardial infarction (HCC)    " BACK IN THE 90'S"   Prostate cancer (HCC)    a. 09/2008 s/p prostatectomy.   PUD (peptic ulcer disease)    Recurrent spontaneous pneumothorax    a. s/p L lobectomy in 1966.   Shortness of breath    Suicide attempt (HCC)    a. 08/2013 attempt by hanging with subsequent resp failure   Syncope    a. in setting of GIB in 2012, presumed to be orthostatic.   Tubular adenoma of colon 2012   Upper GI bleed    a. 2012   Vertebral artery stenosis    a. 09/2010 s/p L vertebral stenting 09/2010.    Past Surgical History:  Procedure Laterality Date   CARDIAC CATHETERIZATION  09/15/2013   CARDIAC SURGERY     CATARACT EXTRACTION Right    LEFT HEART CATHETERIZATION WITH CORONARY ANGIOGRAM N/A 09/15/2013   Procedure: LEFT HEART CATHETERIZATION WITH CORONARY ANGIOGRAM;  Surgeon: Kathleene Hazel, MD;  Location: [facility] CATH LAB;  Service: Cardiovascular;  Laterality: N/A;   LUNG REMOVAL, PARTIAL  1960s   left   PROSTATECTOMY      vertebral artery stent      Social History:  reports that he quit smoking about 20 years ago. His smoking use included cigarettes. He started smoking about 60 years ago. He has a 40 pack-year smoking history. He has never used smokeless tobacco. He reports that he does not drink alcohol and does not use drugs.  Allergies: No Known Allergies  Family History:  Family History  Problem Relation Age of Onset   Alzheimer's disease Mother    Heart attack Father    Prostate cancer Brother      Current Outpatient Medications:    amLODipine (NORVASC) 5 MG tablet, Take 1 tablet (5 mg total) by mouth daily., Disp: 90 tablet, Rfl: 1   Ascorbic Acid (VITAMIN C) 1000 MG tablet, Take 1,000 mg by mouth daily., Disp: , Rfl:    Cholecalciferol (D3 ADULT PO), Take 250 mg by mouth., Disp: , Rfl:    cyclobenzaprine (FLEXERIL) 5 MG tablet, Take 1 tablet (5 mg total) by mouth at bedtime as needed for muscle spasms., Disp: 30 tablet, Rfl: 0   docusate sodium (COLACE) 100 MG capsule, Take 1 capsule (100 mg total) by mouth every 12 (twelve) hours., Disp: 60  capsule, Rfl: 0   meloxicam (MOBIC) 7.5 MG tablet, Take 1 tablet (7.5 mg total) by mouth daily for 10 days., Disp: 10 tablet, Rfl: 0   sertraline (ZOLOFT) 100 MG tablet, Take 1 tablet (100 mg total) by mouth daily., Disp: 90 tablet, Rfl: 1   valsartan-hydrochlorothiazide (DIOVAN-HCT) 80-12.5 MG tablet, Take 1 tablet by mouth daily., Disp: 90 tablet, Rfl: 1  Review of Systems:  Negative unless indicated in HPI.   Physical Exam: Vitals:   07/23/23 0842  BP: 138/65  Pulse: 66  Temp: (!) 97.4 F (36.3 C)  TempSrc: Oral  SpO2: 97%  Weight: 173 lb 8 oz (78.7 kg)    Body mass index is 23.53 kg/m.   Physical Exam Musculoskeletal:     Comments: Pain to palpation of the paraspinal muscles at the left lower back area.      Impression and Plan:  Acute right-sided low back pain without sciatica -     POCT Urinalysis Dipstick (Automated) -      Ambulatory referral to Home Health -     Meloxicam; Take 1 tablet (7.5 mg total) by mouth daily for 10 days.  Dispense: 10 tablet; Refill: 0 -     Cyclobenzaprine HCl; Take 1 tablet (5 mg total) by mouth at bedtime as needed for muscle spasms.  Dispense: 30 tablet; Refill: 0   -Suspect this is simply musculoskeletal pain given lack of red flag signs/symptoms. -Advised icing, as needed NSAIDs, back stretches, local massage therapy.  Will also prescribe a 10-day course of meloxicam and muscle relaxers. -Home health PT requested.   Time spent:22 minutes reviewing chart, interviewing and examining patient and formulating plan of care.     Chaya Jan, MD [facility] at [facility]

## 2023-07-24 ENCOUNTER — Telehealth: Payer: Self-pay | Admitting: Internal Medicine

## 2023-07-24 DIAGNOSIS — G309 Alzheimer's disease, unspecified: Secondary | ICD-10-CM | POA: Diagnosis not present

## 2023-07-24 DIAGNOSIS — I2510 Atherosclerotic heart disease of native coronary artery without angina pectoris: Secondary | ICD-10-CM | POA: Diagnosis not present

## 2023-07-24 DIAGNOSIS — I10 Essential (primary) hypertension: Secondary | ICD-10-CM | POA: Diagnosis not present

## 2023-07-24 NOTE — Telephone Encounter (Signed)
Brett [facility] is calling and need VO for PT 1x4

## 2023-07-24 NOTE — Telephone Encounter (Signed)
Verbal orders given to  Brett [facility]

## 2023-07-26 DIAGNOSIS — Z9181 History of falling: Secondary | ICD-10-CM | POA: Diagnosis not present

## 2023-07-26 DIAGNOSIS — Z791 Long term (current) use of non-steroidal anti-inflammatories (NSAID): Secondary | ICD-10-CM | POA: Diagnosis not present

## 2023-07-26 DIAGNOSIS — Z9841 Cataract extraction status, right eye: Secondary | ICD-10-CM | POA: Diagnosis not present

## 2023-07-26 DIAGNOSIS — I2510 Atherosclerotic heart disease of native coronary artery without angina pectoris: Secondary | ICD-10-CM | POA: Diagnosis not present

## 2023-07-26 DIAGNOSIS — D509 Iron deficiency anemia, unspecified: Secondary | ICD-10-CM | POA: Diagnosis not present

## 2023-07-26 DIAGNOSIS — G8929 Other chronic pain: Secondary | ICD-10-CM | POA: Diagnosis not present

## 2023-07-26 DIAGNOSIS — Z87891 Personal history of nicotine dependence: Secondary | ICD-10-CM | POA: Diagnosis not present

## 2023-07-26 DIAGNOSIS — M47816 Spondylosis without myelopathy or radiculopathy, lumbar region: Secondary | ICD-10-CM | POA: Diagnosis not present

## 2023-07-26 DIAGNOSIS — E785 Hyperlipidemia, unspecified: Secondary | ICD-10-CM | POA: Diagnosis not present

## 2023-07-26 DIAGNOSIS — H9193 Unspecified hearing loss, bilateral: Secondary | ICD-10-CM | POA: Diagnosis not present

## 2023-07-26 DIAGNOSIS — Z902 Acquired absence of lung [part of]: Secondary | ICD-10-CM | POA: Diagnosis not present

## 2023-07-26 DIAGNOSIS — I10 Essential (primary) hypertension: Secondary | ICD-10-CM | POA: Diagnosis not present

## 2023-07-26 DIAGNOSIS — J9690 Respiratory failure, unspecified, unspecified whether with hypoxia or hypercapnia: Secondary | ICD-10-CM | POA: Diagnosis not present

## 2023-07-26 DIAGNOSIS — I252 Old myocardial infarction: Secondary | ICD-10-CM | POA: Diagnosis not present

## 2023-07-26 DIAGNOSIS — K219 Gastro-esophageal reflux disease without esophagitis: Secondary | ICD-10-CM | POA: Diagnosis not present

## 2023-07-26 DIAGNOSIS — G309 Alzheimer's disease, unspecified: Secondary | ICD-10-CM | POA: Diagnosis not present

## 2023-07-31 ENCOUNTER — Encounter (INDEPENDENT_AMBULATORY_CARE_PROVIDER_SITE_OTHER): Payer: Self-pay

## 2023-07-31 DIAGNOSIS — I10 Essential (primary) hypertension: Secondary | ICD-10-CM | POA: Diagnosis not present

## 2023-07-31 DIAGNOSIS — G309 Alzheimer's disease, unspecified: Secondary | ICD-10-CM | POA: Diagnosis not present

## 2023-07-31 DIAGNOSIS — I2510 Atherosclerotic heart disease of native coronary artery without angina pectoris: Secondary | ICD-10-CM | POA: Diagnosis not present

## 2023-08-07 DIAGNOSIS — E785 Hyperlipidemia, unspecified: Secondary | ICD-10-CM | POA: Diagnosis not present

## 2023-08-07 DIAGNOSIS — G8929 Other chronic pain: Secondary | ICD-10-CM | POA: Diagnosis not present

## 2023-08-07 DIAGNOSIS — M47816 Spondylosis without myelopathy or radiculopathy, lumbar region: Secondary | ICD-10-CM | POA: Diagnosis not present

## 2023-08-07 DIAGNOSIS — Z791 Long term (current) use of non-steroidal anti-inflammatories (NSAID): Secondary | ICD-10-CM | POA: Diagnosis not present

## 2023-08-07 DIAGNOSIS — I2510 Atherosclerotic heart disease of native coronary artery without angina pectoris: Secondary | ICD-10-CM | POA: Diagnosis not present

## 2023-08-07 DIAGNOSIS — I10 Essential (primary) hypertension: Secondary | ICD-10-CM | POA: Diagnosis not present

## 2023-08-07 DIAGNOSIS — I252 Old myocardial infarction: Secondary | ICD-10-CM | POA: Diagnosis not present

## 2023-08-07 DIAGNOSIS — J9690 Respiratory failure, unspecified, unspecified whether with hypoxia or hypercapnia: Secondary | ICD-10-CM | POA: Diagnosis not present

## 2023-08-07 DIAGNOSIS — Z9841 Cataract extraction status, right eye: Secondary | ICD-10-CM | POA: Diagnosis not present

## 2023-08-07 DIAGNOSIS — Z9181 History of falling: Secondary | ICD-10-CM | POA: Diagnosis not present

## 2023-08-07 DIAGNOSIS — H9193 Unspecified hearing loss, bilateral: Secondary | ICD-10-CM | POA: Diagnosis not present

## 2023-08-07 DIAGNOSIS — Z902 Acquired absence of lung [part of]: Secondary | ICD-10-CM | POA: Diagnosis not present

## 2023-08-07 DIAGNOSIS — Z87891 Personal history of nicotine dependence: Secondary | ICD-10-CM | POA: Diagnosis not present

## 2023-08-07 DIAGNOSIS — D509 Iron deficiency anemia, unspecified: Secondary | ICD-10-CM | POA: Diagnosis not present

## 2023-08-07 DIAGNOSIS — G309 Alzheimer's disease, unspecified: Secondary | ICD-10-CM | POA: Diagnosis not present

## 2023-08-07 DIAGNOSIS — K219 Gastro-esophageal reflux disease without esophagitis: Secondary | ICD-10-CM | POA: Diagnosis not present

## 2023-08-08 DIAGNOSIS — I10 Essential (primary) hypertension: Secondary | ICD-10-CM | POA: Diagnosis not present

## 2023-08-08 DIAGNOSIS — G309 Alzheimer's disease, unspecified: Secondary | ICD-10-CM | POA: Diagnosis not present

## 2023-08-08 DIAGNOSIS — I2510 Atherosclerotic heart disease of native coronary artery without angina pectoris: Secondary | ICD-10-CM | POA: Diagnosis not present

## 2023-08-12 DIAGNOSIS — G309 Alzheimer's disease, unspecified: Secondary | ICD-10-CM | POA: Diagnosis not present

## 2023-08-12 DIAGNOSIS — I10 Essential (primary) hypertension: Secondary | ICD-10-CM | POA: Diagnosis not present

## 2023-08-12 DIAGNOSIS — I2510 Atherosclerotic heart disease of native coronary artery without angina pectoris: Secondary | ICD-10-CM | POA: Diagnosis not present

## 2023-08-21 DIAGNOSIS — I2510 Atherosclerotic heart disease of native coronary artery without angina pectoris: Secondary | ICD-10-CM | POA: Diagnosis not present

## 2023-08-21 DIAGNOSIS — I10 Essential (primary) hypertension: Secondary | ICD-10-CM | POA: Diagnosis not present

## 2023-08-21 DIAGNOSIS — G309 Alzheimer's disease, unspecified: Secondary | ICD-10-CM | POA: Diagnosis not present

## 2023-09-03 ENCOUNTER — Encounter: Payer: Self-pay | Admitting: Internal Medicine

## 2023-09-03 ENCOUNTER — Other Ambulatory Visit: Payer: Self-pay | Admitting: Internal Medicine

## 2023-09-03 ENCOUNTER — Ambulatory Visit (INDEPENDENT_AMBULATORY_CARE_PROVIDER_SITE_OTHER): Payer: 59 | Admitting: Internal Medicine

## 2023-09-03 VITALS — BP 110/70 | HR 70 | Temp 97.8°F | Ht 72.0 in | Wt 172.3 lb

## 2023-09-03 DIAGNOSIS — E538 Deficiency of other specified B group vitamins: Secondary | ICD-10-CM | POA: Diagnosis not present

## 2023-09-03 DIAGNOSIS — I10 Essential (primary) hypertension: Secondary | ICD-10-CM | POA: Diagnosis not present

## 2023-09-03 DIAGNOSIS — E559 Vitamin D deficiency, unspecified: Secondary | ICD-10-CM | POA: Diagnosis not present

## 2023-09-03 DIAGNOSIS — E876 Hypokalemia: Secondary | ICD-10-CM | POA: Diagnosis not present

## 2023-09-03 DIAGNOSIS — Z0000 Encounter for general adult medical examination without abnormal findings: Secondary | ICD-10-CM | POA: Diagnosis not present

## 2023-09-03 DIAGNOSIS — F03A0 Unspecified dementia, mild, without behavioral disturbance, psychotic disturbance, mood disturbance, and anxiety: Secondary | ICD-10-CM | POA: Diagnosis not present

## 2023-09-03 LAB — COMPREHENSIVE METABOLIC PANEL WITH GFR
ALT: 11 U/L (ref 0–53)
AST: 14 U/L (ref 0–37)
Albumin: 4.5 g/dL (ref 3.5–5.2)
Alkaline Phosphatase: 62 U/L (ref 39–117)
BUN: 19 mg/dL (ref 6–23)
CO2: 28 meq/L (ref 19–32)
Calcium: 9.6 mg/dL (ref 8.4–10.5)
Chloride: 101 meq/L (ref 96–112)
Creatinine, Ser: 1.46 mg/dL (ref 0.40–1.50)
GFR: 45.22 mL/min — ABNORMAL LOW (ref >60.00)
Glucose, Bld: 99 mg/dL (ref 70–99)
Potassium: 4 meq/L (ref 3.5–5.1)
Sodium: 138 meq/L (ref 135–145)
Total Bilirubin: 0.6 mg/dL (ref 0.2–1.2)
Total Protein: 7.1 g/dL (ref 6.0–8.3)

## 2023-09-03 LAB — CBC WITH DIFFERENTIAL/PLATELET
Basophils Absolute: 0.1 10*3/uL (ref 0.0–0.1)
Basophils Relative: 1.2 % (ref 0.0–3.0)
Eosinophils Absolute: 0.2 10*3/uL (ref 0.0–0.7)
Eosinophils Relative: 3.5 % (ref 0.0–5.0)
HCT: 41.8 % (ref 39.0–52.0)
Hemoglobin: 13.7 g/dL (ref 13.0–17.0)
Lymphocytes Relative: 18.7 % (ref 12.0–46.0)
Lymphs Abs: 1.3 10*3/uL (ref 0.7–4.0)
MCHC: 32.8 g/dL (ref 30.0–36.0)
MCV: 84.2 fl (ref 78.0–100.0)
Monocytes Absolute: 0.7 10*3/uL (ref 0.1–1.0)
Monocytes Relative: 9.7 % (ref 3.0–12.0)
Neutro Abs: 4.6 10*3/uL (ref 1.4–7.7)
Neutrophils Relative %: 66.9 % (ref 43.0–77.0)
Platelets: 293 10*3/uL (ref 150.0–400.0)
RBC: 4.96 Mil/uL (ref 4.22–5.81)
RDW: 15.2 % (ref 11.5–15.5)
WBC: 7 10*3/uL (ref 4.0–10.5)

## 2023-09-03 LAB — LIPID PANEL
Cholesterol: 164 mg/dL (ref 0–200)
HDL: 71.7 mg/dL (ref >39.00)
LDL Cholesterol: 77 mg/dL (ref 0–99)
NonHDL: 92.1
Total CHOL/HDL Ratio: 2
Triglycerides: 77 mg/dL (ref 0.0–149.0)
VLDL: 15.4 mg/dL (ref 0.0–40.0)

## 2023-09-03 LAB — TSH: TSH: 3.39 u[IU]/mL (ref 0.35–5.50)

## 2023-09-03 LAB — VITAMIN D 25 HYDROXY (VIT D DEFICIENCY, FRACTURES): VITD: 22.68 ng/mL — ABNORMAL LOW (ref 30.00–100.00)

## 2023-09-03 LAB — VITAMIN B12: Vitamin B-12: 183 pg/mL — ABNORMAL LOW (ref 211–911)

## 2023-09-03 LAB — PSA: PSA: 0 ng/mL — ABNORMAL LOW (ref 0.10–4.00)

## 2023-09-03 MED ORDER — VITAMIN D (ERGOCALCIFEROL) 1.25 MG (50000 UNIT) PO CAPS
50000.0000 [IU] | ORAL_CAPSULE | ORAL | 0 refills | Status: AC
Start: 2023-09-03 — End: 2023-11-20

## 2023-09-03 NOTE — Progress Notes (Signed)
Established Patient Office Visit     CC/Reason for Visit: Annual preventive exam and subsequent Medicare wellness visit  HPI: Anthony Skinner is a 82 y.o. male who is coming in today for the above mentioned reasons. Past Medical History is significant for: Depression, hyperlipidemia, known aortic atherosclerosis, hypertension, mild Alzheimer's dementia, prostate cancer, vitamin D and B12 deficiencies.  Has been feeling well other than some chronic lower back pain.  Has routine eye and dental care.  He just received his flu vaccine at [facility] yesterday.  Is due for COVID and RSV vaccines.   Past Medical/Surgical History: Past Medical History:  Diagnosis Date   Alzheimer disease (HCC)    CAD (coronary artery disease)    a. reported h/o MI in the 22's;  b. 04/2000 Cath: LM nl, LAD 40p, D1 small, nl, RI nl, LCX nl, RCA nl.   Dementia (HCC)    Depression    DJD (degenerative joint disease)    Fatty liver    Gastropathy 2012   reactive   GERD (gastroesophageal reflux disease)    Hiatal hernia    Hyperlipidemia    Hypertension    Hypertension    Iron deficiency anemia    Myocardial infarction (HCC)    " BACK IN THE 90'S"   Prostate cancer (HCC)    a. 09/2008 s/p prostatectomy.   PUD (peptic ulcer disease)    Recurrent spontaneous pneumothorax    a. s/p L lobectomy in 1966.   Shortness of breath    Suicide attempt (HCC)    a. 08/2013 attempt by hanging with subsequent resp failure   Syncope    a. in setting of GIB in 2012, presumed to be orthostatic.   Tubular adenoma of colon 2012   Upper GI bleed    a. 2012   Vertebral artery stenosis    a. 09/2010 s/p L vertebral stenting 09/2010.    Past Surgical History:  Procedure Laterality Date   CARDIAC CATHETERIZATION  09/15/2013   CARDIAC SURGERY     CATARACT EXTRACTION Right    LEFT HEART CATHETERIZATION WITH CORONARY ANGIOGRAM N/A 09/15/2013   Procedure: LEFT HEART CATHETERIZATION WITH CORONARY ANGIOGRAM;  Surgeon:  Kathleene Hazel, MD;  Location: [facility] CATH LAB;  Service: Cardiovascular;  Laterality: N/A;   LUNG REMOVAL, PARTIAL  1960s   left   PROSTATECTOMY     vertebral artery stent      Social History:  reports that he quit smoking about 21 years ago. His smoking use included cigarettes. He started smoking about 61 years ago. He has a 40 pack-year smoking history. He has never used smokeless tobacco. He reports that he does not drink alcohol and does not use drugs.  Allergies: No Known Allergies  Family History:  Family History  Problem Relation Age of Onset   Alzheimer's disease Mother    Heart attack Father    Prostate cancer Brother      Current Outpatient Medications:    amLODipine (NORVASC) 5 MG tablet, Take 1 tablet (5 mg total) by mouth daily., Disp: 90 tablet, Rfl: 1   Ascorbic Acid (VITAMIN C) 1000 MG tablet, Take 1,000 mg by mouth daily., Disp: , Rfl:    Cholecalciferol (D3 ADULT PO), Take 250 mg by mouth., Disp: , Rfl:    cyclobenzaprine (FLEXERIL) 5 MG tablet, Take 1 tablet (5 mg total) by mouth at bedtime as needed for muscle spasms., Disp: 30 tablet, Rfl: 0   docusate sodium (COLACE) 100 MG capsule,  Take 1 capsule (100 mg total) by mouth every 12 (twelve) hours., Disp: 60 capsule, Rfl: 0   sertraline (ZOLOFT) 100 MG tablet, Take 1 tablet (100 mg total) by mouth daily., Disp: 90 tablet, Rfl: 1   valsartan-hydrochlorothiazide (DIOVAN-HCT) 80-12.5 MG tablet, Take 1 tablet by mouth daily., Disp: 90 tablet, Rfl: 1  Review of Systems:  Negative unless indicated in HPI.   Physical Exam: Vitals:   09/03/23 0728  BP: 110/70  Pulse: 70  Temp: 97.8 F (36.6 C)  TempSrc: Oral  SpO2: 98%  Weight: 172 lb 4.8 oz (78.2 kg)  Height: 6' (1.829 m)    Body mass index is 23.37 kg/m.   Physical Exam Vitals reviewed.  Constitutional:      General: He is not in acute distress.    Appearance: Normal appearance. He is not ill-appearing, toxic-appearing or diaphoretic.  HENT:      Head: Normocephalic.     Right Ear: Tympanic membrane, ear canal and external ear normal. There is no impacted cerumen.     Left Ear: Tympanic membrane, ear canal and external ear normal. There is no impacted cerumen.     Nose: Nose normal.     Mouth/Throat:     Mouth: Mucous membranes are moist.     Pharynx: Oropharynx is clear. No oropharyngeal exudate or posterior oropharyngeal erythema.  Eyes:     General: No scleral icterus.       Right eye: No discharge.        Left eye: No discharge.     Conjunctiva/sclera: Conjunctivae normal.     Pupils: Pupils are equal, round, and reactive to light.  Neck:     Vascular: No carotid bruit.  Cardiovascular:     Rate and Rhythm: Normal rate and regular rhythm.     Pulses: Normal pulses.     Heart sounds: Normal heart sounds.  Pulmonary:     Effort: Pulmonary effort is normal. No respiratory distress.     Breath sounds: Normal breath sounds.  Abdominal:     General: Abdomen is flat. Bowel sounds are normal.     Palpations: Abdomen is soft.  Musculoskeletal:        General: Normal range of motion.     Cervical back: Normal range of motion.  Skin:    General: Skin is warm and dry.  Neurological:     General: No focal deficit present.     Mental Status: He is alert and oriented to person, place, and time. Mental status is at baseline.  Psychiatric:        Mood and Affect: Mood normal.        Behavior: Behavior normal.        Thought Content: Thought content normal.        Judgment: Judgment normal.    Subsequent Medicare wellness visit   1. Risk factors, based on past  M,S,F - Cardiac Risk Factors include: advanced age (>15men, >76 women);hypertension;male gender   2.  Physical activities: Dietary issues and exercise activities discussed:      3.  Depression/mood:  Flowsheet Row Office Visit from 09/03/2023 in [facility] Total Score 0        4.  ADL's:    09/03/2023    7:18 AM   In your present state of health, do you have any difficulty performing the following activities:  Hearing? 1  Vision? 0  Difficulty concentrating or making decisions? 1  Walking or climbing  stairs? 0  Dressing or bathing? 0  Doing errands, shopping? 1  Preparing Food and eating ? N  Using the Toilet? N  In the past six months, have you accidently leaked urine? N  Do you have problems with loss of bowel control? N  Managing your Medications? Y  Managing your Finances? Y  Housekeeping or managing your Housekeeping? Y     5.  Fall risk:     01/21/2022    2:04 PM 08/29/2022   10:55 AM 11/28/2022   11:25 AM 07/23/2023    8:46 AM 09/03/2023    7:21 AM  Fall Risk  Falls in the past year? 0 0 1 1 1   Was there an injury with Fall? 0 0 1 1 1   Fall Risk Category Calculator 0 0 3 2 2   Fall Risk Category (Retired) Low Low High    (RETIRED) Patient Fall Risk Level Low fall risk Low fall risk High fall risk    Patient at Risk for Falls Due to No Fall Risks No Fall Risks Impaired balance/gait    Fall risk Follow up Falls evaluation completed Falls evaluation completed Falls evaluation completed Falls evaluation completed Falls evaluation completed     6.  Home safety: No problems identified   7.  Height weight, and visual acuity: height and weight as above, vision/hearing: Vision Screening   Right eye Left eye Both eyes  Without correction     With correction 20/25 20/25 20/25      8.  Counseling: Counseling given: Not Answered    9. Lab orders based on risk factors: Laboratory update will be reviewed   10. Cognitive assessment:    12/21/2014   12:09 PM 07/15/2013    9:00 AM  MMSE - Mini Mental State Exam  Orientation to time 5 5  Orientation to Place 5 5  Registration 3 3  Attention/ Calculation 1 5  Recall 2 3  Language- name 2 objects 2 2  Language- repeat 1 1  Language- follow 3 step command 3 3  Language- read & follow direction 1 1  Write a sentence 1 1  Copy design 1 1   Total score 25 30        09/03/2023    7:22 AM  6CIT Screen  What Year? 0 points  What month? 0 points  What time? 0 points  Count back from 20 4 points  Months in reverse 0 points  Repeat phrase 0 points  Total Score 4 points     11. Screening: Patient provided with a written and personalized 5-10 year screening schedule in the AVS. Health Maintenance  Topic Date Due   COVID-19 Vaccine (5 - 2023-24 season) 09/19/2023*   Medicare Annual Wellness Visit  09/02/2024   DTaP/Tdap/Td vaccine (2 - Td or Tdap) 10/24/2032   Pneumonia Vaccine  Completed   Flu Shot  Completed   Zoster (Shingles) Vaccine  Completed   HPV Vaccine  Aged Out   Colon Cancer Screening  Discontinued  *Topic was postponed. The date shown is not the original due date.    12. Provider List Update: Patient Care Team    Relationship Specialty Notifications Start End  Philip Aspen, Limmie Patricia, MD PCP - General Internal Medicine  04/28/20   Rinaldo Cloud, MD  Cardiology  08/27/13    Comment: (Merged)      13. Advance Directives: Does Patient Have a Medical Advance Directive?: Yes Type of Advance Directive: Out of facility DNR (pink MOST or  yellow form), Living will, Healthcare Power of Attorney Does patient want to make changes to medical advance directive?: No - Patient declined Copy of Healthcare Power of Attorney in Chart?: No - copy requested  14. Opioids: Patient is not on any opioid prescriptions and has no risk factors for a substance use disorder.   15.   Goals      Protect My Health     Timeframe:  Long-Range Goal Priority:  Medium Start Date:                             Expected End Date:                       Follow Up Date 09/02/2024    - schedule and keep appointment for annual check-up    Why is this important?   Screening tests can find diseases early when they are easier to treat.  Your doctor or nurse will talk with you about which tests are important for you.  Getting shots  for common diseases like the flu and shingles will help prevent them.     Notes:          I have personally reviewed and noted the following in the patient's chart:   Medical and social history Use of alcohol, tobacco or illicit drugs  Current medications and supplements Functional ability and status Nutritional status Physical activity Advanced directives List of other physicians Hospitalizations, surgeries, and ER visits in previous 12 months Vitals Screenings to include cognitive, depression, and falls Referrals and appointments  In addition, I have reviewed and discussed with patient certain preventive protocols, quality metrics, and best practice recommendations. A written personalized care plan for preventive services as well as general preventive health recommendations were provided to patient.   Impression and Plan:  Medicare annual wellness visit, subsequent -     PSA; Future  Primary hypertension -     CBC with Differential/Platelet; Future -     Comprehensive metabolic panel; Future -     Lipid panel; Future  Vitamin B 12 deficiency  Vitamin D deficiency  Hypokalemia  Mild dementia without behavioral disturbance, psychotic disturbance, mood disturbance, or anxiety, unspecified dementia type (HCC) -     TSH; Future -     Vitamin B12; Future -     VITAMIN D 25 Hydroxy (Vit-D Deficiency, Fractures); Future   -Recommend routine eye and dental care. -Healthy lifestyle discussed in detail. -Labs to be updated today. -Prostate cancer screening: PSA today Health Maintenance  Topic Date Due   COVID-19 Vaccine (5 - 2023-24 season) 09/19/2023*   Medicare Annual Wellness Visit  09/02/2024   DTaP/Tdap/Td vaccine (2 - Td or Tdap) 10/24/2032   Pneumonia Vaccine  Completed   Flu Shot  Completed   Zoster (Shingles) Vaccine  Completed   HPV Vaccine  Aged Out   Colon Cancer Screening  Discontinued  *Topic was postponed. The date shown is not the original due  date.     -He will update COVID and RSV at a later date. -No further cancer screening due to age and comorbidities.     Chaya Jan, MD [facility] at [facility]

## 2023-09-10 ENCOUNTER — Encounter: Payer: Self-pay | Admitting: *Deleted

## 2023-09-17 ENCOUNTER — Encounter: Payer: Self-pay | Admitting: Internal Medicine

## 2023-10-15 ENCOUNTER — Telehealth: Payer: Self-pay | Admitting: Internal Medicine

## 2023-10-15 NOTE — Telephone Encounter (Signed)
Checking on progress of FL2 that was faxed 10/14/23

## 2023-10-15 NOTE — Telephone Encounter (Signed)
Placed in Dr Hernandez's folder 

## 2023-10-16 NOTE — Telephone Encounter (Signed)
Form has been faxed and confirmed

## 2023-10-19 ENCOUNTER — Ambulatory Visit (HOSPITAL_COMMUNITY)
Admission: EM | Admit: 2023-10-19 | Discharge: 2023-10-19 | Disposition: A | Discharge status: Home / Self Care | Payer: 59 | Attending: Emergency Medicine | Admitting: Emergency Medicine

## 2023-10-19 ENCOUNTER — Encounter (HOSPITAL_COMMUNITY): Payer: Self-pay | Admitting: Emergency Medicine

## 2023-10-19 DIAGNOSIS — H6002 Abscess of left external ear: Secondary | ICD-10-CM | POA: Diagnosis not present

## 2023-10-19 MED ORDER — DOXYCYCLINE HYCLATE 100 MG PO CAPS: 100.0000 mg | ORAL_CAPSULE | Freq: Two times a day (BID) | ORAL | 0 refills | Status: AC

## 2023-10-19 MED ORDER — ACETAMINOPHEN 325 MG PO TABS: 650.0000 mg | ORAL_TABLET | Freq: Four times a day (QID) | ORAL | 2 refills | Status: AC | PRN

## 2023-10-19 NOTE — ED Provider Notes (Signed)
[facility]    CSN: [id number] Arrival date & time: 10/19/23  1036      History   Chief Complaint Chief Complaint  Patient presents with   Otalgia    HPI Anthony Skinner is a 82 y.o. male.  Presents with daughter who helps provide history  Here with 1 week history of left ear pain Reports a bump on the outside of ear, tender to touch No injury or trauma known   Past Medical History:  Diagnosis Date   Alzheimer disease (HCC)    CAD (coronary artery disease)    a. reported h/o MI in the 76's;  b. 04/2000 Cath: LM nl, LAD 40p, D1 small, nl, RI nl, LCX nl, RCA nl.   Dementia (HCC)    Depression    DJD (degenerative joint disease)    Fatty liver    Gastropathy 2012   reactive   GERD (gastroesophageal reflux disease)    Hiatal hernia    Hyperlipidemia    Hypertension    Hypertension    Iron deficiency anemia    Myocardial infarction (HCC)    " BACK IN THE 90'S"   Prostate cancer (HCC)    a. 09/2008 s/p prostatectomy.   PUD (peptic ulcer disease)    Recurrent spontaneous pneumothorax    a. s/p L lobectomy in 1966.   Shortness of breath    Suicide attempt (HCC)    a. 08/2013 attempt by hanging with subsequent resp failure   Syncope    a. in setting of GIB in 2012, presumed to be orthostatic.   Tubular adenoma of colon 2012   Upper GI bleed    a. 2012   Vertebral artery stenosis    a. 09/2010 s/p L vertebral stenting 09/2010.    Patient Active Problem List   Diagnosis Date Noted   Vitamin D deficiency 08/03/2020   Urethral stricture 07/23/2017   Acute kidney injury (HCC) 02/14/2017   GERD (gastroesophageal reflux disease) 02/14/2017   Severe major depression with psychotic features (HCC) 02/13/2017   Urinary urgency 09/20/2016   Moderate episode of recurrent major depressive disorder (HCC) 10/14/2015   Suicidal thoughts    Dementia with behavioral disturbance (HCC) 09/01/2015   Dementia (HCC)    Major depressive disorder without psychotic features  (HCC) 05/24/2015   Aggressive behavior    SUI (stress urinary incontinence), male 11/28/2014   Malignant neoplasm of prostate (HCC) 05/23/2014   Chest pain 09/15/2013   Coronary atherosclerosis of native coronary artery 09/15/2013   Hypokalemia 08/28/2013   Acute respiratory failure (HCC) 08/25/2013   Altered mental status 08/25/2013   Anoxic brain injury (HCC) 08/25/2013   HTN (hypertension) 08/25/2013   Suicide attempt (HCC) 08/25/2013   Vitamin B 12 deficiency 07/15/2013   Hereditary and idiopathic peripheral neuropathy 07/15/2013   Memory loss 07/15/2013    Past Surgical History:  Procedure Laterality Date   CARDIAC CATHETERIZATION  09/15/2013   CARDIAC SURGERY     CATARACT EXTRACTION Right    LEFT HEART CATHETERIZATION WITH CORONARY ANGIOGRAM N/A 09/15/2013   Procedure: LEFT HEART CATHETERIZATION WITH CORONARY ANGIOGRAM;  Surgeon: Kathleene Hazel, MD;  Location: [facility] CATH LAB;  Service: Cardiovascular;  Laterality: N/A;   LUNG REMOVAL, PARTIAL  1960s   left   PROSTATECTOMY     vertebral artery stent         Home Medications    Prior to Admission medications   Medication Sig Start Date End Date Taking? Authorizing Provider  acetaminophen (TYLENOL)  325 MG tablet Take 2 tablets (650 mg total) by mouth every 6 (six) hours as needed. 10/19/23  Yes Autumne Kallio, Lurena Joiner, PA-C  doxycycline (VIBRAMYCIN) 100 MG capsule Take 1 capsule (100 mg total) by mouth 2 (two) times daily for 7 days. 10/19/23 10/26/23 Yes Natelie Ostrosky, Lurena Joiner, PA-C  amLODipine (NORVASC) 5 MG tablet Take 1 tablet (5 mg total) by mouth daily. 08/29/22   Philip Aspen, Limmie Patricia, MD  Cholecalciferol (D3 ADULT PO) Take 250 mg by mouth.    [provider]  sertraline (ZOLOFT) 100 MG tablet Take 1 tablet (100 mg total) by mouth daily. 01/21/22   Philip Aspen, Limmie Patricia, MD  valsartan-hydrochlorothiazide (DIOVAN-HCT) 80-12.5 MG tablet Take 1 tablet by mouth daily. 11/28/22   Philip Aspen, Limmie Patricia, MD   Vitamin D, Ergocalciferol, (DRISDOL) 1.25 MG (50000 UNIT) CAPS capsule Take 1 capsule (50,000 Units total) by mouth every 7 (seven) days for 12 doses. 09/03/23 11/20/23  Henderson Cloud, MD    Family History Family History  Problem Relation Age of Onset   Alzheimer's disease Mother    Heart attack Father    Prostate cancer Brother     Social History Social History   Tobacco Use   Smoking status: Former    Current packs/day: 0.00    Average packs/day: 1 pack/day for 40.0 years (40.0 ttl pk-yrs)    Types: Cigarettes    Start date: 08/27/1962    Quit date: 08/27/2002    Years since quitting: 21.1   Smokeless tobacco: Never  Vaping Use   Vaping status: Never Used  Substance Use Topics   Alcohol use: No    Comment: Hx heavy EtOH use but quit 2011   Drug use: No     Allergies   Patient has no known allergies.   Review of Systems Review of Systems  HENT:  Positive for ear pain.    Per HPI  Physical Exam Triage Vital Signs ED Triage Vitals  Encounter Vitals Group     BP 10/19/23 1109 134/64     Systolic BP Percentile --      Diastolic BP Percentile --      Pulse Rate 10/19/23 1109 67     Resp 10/19/23 1109 17     Temp 10/19/23 1109 98.1 F (36.7 C)     Temp Source 10/19/23 1109 Oral     SpO2 10/19/23 1109 96 %     Weight --      Height --      Head Circumference --      Peak Flow --      Pain Score 10/19/23 1107 7     Pain Loc --      Pain Education --      Exclude from Growth Chart --    No data found.  Updated Vital Signs BP 134/64 (BP Location: Left Arm)   Pulse 67   Temp 98.1 F (36.7 C) (Oral)   Resp 17   SpO2 96%   Physical Exam Vitals and nursing note reviewed.  Constitutional:      General: He is not in acute distress.    Appearance: He is not ill-appearing.  HENT:     Ears:      Comments: Large abscess on left extremal ear. Firm to touch. Not draining.  Cardiovascular:     Rate and Rhythm: Normal rate and regular rhythm.      Pulses: Normal pulses.  Pulmonary:     Effort: Pulmonary effort is  normal.  Skin:    Findings: Abscess present.  Neurological:     Mental Status: He is alert and oriented to person, place, and time.     UC Treatments / Results  Labs (all labs ordered are listed, but only abnormal results are displayed) Labs Reviewed - No data to display  EKG   Radiology No results found.  Procedures Procedures (including critical care time)  Medications Ordered in UC Medications - No data to display  Initial Impression / Assessment and Plan / UC Course  I have reviewed the triage vital signs and the nursing notes.  Pertinent labs & imaging results that were available during my care of the patient were reviewed by me and considered in my medical decision making (see chart for details).  External ear abscess Doxycycline BID x 7 days Warm compress, tylenol for pain Advised return precautions, follow up with primary care Patient agreeable to plan  Final Clinical Impressions(s) / UC Diagnoses   Final diagnoses:  Abscess of left external ear     Discharge Instructions      You have an abscess (infection) of the outer ear This needs to be treated with antibiotics Please take the doxycycline twice daily for a total of 7 days It's very important to take after eating food! I recommend taking after breakfast and dinner. Please finish the full course  Drink lots of fluids!  Tylenol can be used for pain - 650 mg every 6 hours as needed Also try warm compress to the ear, 10-15 minutes several times daily  Please contact primary care provider for follow up     ED Prescriptions     Medication Sig Dispense Auth. Provider   acetaminophen (TYLENOL) 325 MG tablet Take 2 tablets (650 mg total) by mouth every 6 (six) hours as needed. 30 tablet Leonora Gores, PA-C   doxycycline (VIBRAMYCIN) 100 MG capsule Take 1 capsule (100 mg total) by mouth 2 (two) times daily for 7 days. 14 capsule  Karolyna Bianchini, Lurena Joiner, PA-C      PDMP not reviewed this encounter.   Marlow Baars, [place] 10/19/23 1143

## 2023-10-19 NOTE — ED Triage Notes (Signed)
Pt c/o left ear pain. Pt has bump on ear that has been present for 1 week.

## 2023-10-19 NOTE — Discharge Instructions (Addendum)
You have an abscess (infection) of the outer ear This needs to be treated with antibiotics Please take the doxycycline twice daily for a total of 7 days It's very important to take after eating food! I recommend taking after breakfast and dinner. Please finish the full course  Drink lots of fluids!  Tylenol can be used for pain - 650 mg every 6 hours as needed Also try warm compress to the ear, 10-15 minutes several times daily  Please contact primary care provider for follow up

## 2023-10-30 ENCOUNTER — Encounter: Payer: Self-pay | Admitting: Internal Medicine

## 2023-10-30 ENCOUNTER — Ambulatory Visit (INDEPENDENT_AMBULATORY_CARE_PROVIDER_SITE_OTHER): Payer: 59 | Admitting: Internal Medicine

## 2023-10-30 VITALS — BP 120/70 | HR 80 | Temp 97.4°F | Wt 174.0 lb

## 2023-10-30 DIAGNOSIS — D239 Other benign neoplasm of skin, unspecified: Secondary | ICD-10-CM

## 2023-10-30 NOTE — Progress Notes (Signed)
Established Patient Office Visit     CC/Reason for Visit: Left outer ear pain  HPI: Anthony Skinner is a 82 y.o. male who is coming in today for the above mentioned reasons.  He states has been there for about 2 weeks.  No injury that he can recall.  He already saw urgent care and was diagnosed with an "abscess" and treated with doxycycline.  It is painful to touch.  To me it appears to be a dilated pore of Winer with a large blackhead and secondary infection.  He is in the middle of his doxycycline course.  Past Medical/Surgical History: Past Medical History:  Diagnosis Date   Alzheimer disease (HCC)    CAD (coronary artery disease)    a. reported h/o MI in the 21's;  b. 04/2000 Cath: LM nl, LAD 40p, D1 small, nl, RI nl, LCX nl, RCA nl.   Dementia (HCC)    Depression    DJD (degenerative joint disease)    Fatty liver    Gastropathy 2012   reactive   GERD (gastroesophageal reflux disease)    Hiatal hernia    Hyperlipidemia    Hypertension    Hypertension    Iron deficiency anemia    Myocardial infarction (HCC)    " BACK IN THE 90'S"   Prostate cancer (HCC)    a. 09/2008 s/p prostatectomy.   PUD (peptic ulcer disease)    Recurrent spontaneous pneumothorax    a. s/p L lobectomy in 1966.   Shortness of breath    Suicide attempt (HCC)    a. 08/2013 attempt by hanging with subsequent resp failure   Syncope    a. in setting of GIB in 2012, presumed to be orthostatic.   Tubular adenoma of colon 2012   Upper GI bleed    a. 2012   Vertebral artery stenosis    a. 09/2010 s/p L vertebral stenting 09/2010.    Past Surgical History:  Procedure Laterality Date   CARDIAC CATHETERIZATION  09/15/2013   CARDIAC SURGERY     CATARACT EXTRACTION Right    LEFT HEART CATHETERIZATION WITH CORONARY ANGIOGRAM N/A 09/15/2013   Procedure: LEFT HEART CATHETERIZATION WITH CORONARY ANGIOGRAM;  Surgeon: Kathleene Hazel, MD;  Location: [facility] CATH LAB;  Service: Cardiovascular;  Laterality:  N/A;   LUNG REMOVAL, PARTIAL  1960s   left   PROSTATECTOMY     vertebral artery stent      Social History:  reports that he quit smoking about 21 years ago. His smoking use included cigarettes. He started smoking about 61 years ago. He has a 40 pack-year smoking history. He has never used smokeless tobacco. He reports that he does not drink alcohol and does not use drugs.  Allergies: No Known Allergies  Family History:  Family History  Problem Relation Age of Onset   Alzheimer's disease Mother    Heart attack Father    Prostate cancer Brother      Current Outpatient Medications:    acetaminophen (TYLENOL) 325 MG tablet, Take 2 tablets (650 mg total) by mouth every 6 (six) hours as needed., Disp: 30 tablet, Rfl: 2   amLODipine (NORVASC) 5 MG tablet, Take 1 tablet (5 mg total) by mouth daily., Disp: 90 tablet, Rfl: 1   Cholecalciferol (D3 ADULT PO), Take 250 mg by mouth., Disp: , Rfl:    sertraline (ZOLOFT) 100 MG tablet, Take 1 tablet (100 mg total) by mouth daily., Disp: 90 tablet, Rfl: 1   valsartan-hydrochlorothiazide (  DIOVAN-HCT) 80-12.5 MG tablet, Take 1 tablet by mouth daily., Disp: 90 tablet, Rfl: 1   Vitamin D, Ergocalciferol, (DRISDOL) 1.25 MG (50000 UNIT) CAPS capsule, Take 1 capsule (50,000 Units total) by mouth every 7 (seven) days for 12 doses., Disp: 12 capsule, Rfl: 0  Review of Systems:  Negative unless indicated in HPI.   Physical Exam: Vitals:   10/30/23 0926  BP: 120/70  Pulse: 80  Temp: (!) 97.4 F (36.3 C)  TempSrc: Oral  SpO2: 98%  Weight: 174 lb (78.9 kg)    Body mass index is 23.6 kg/m.   Physical Exam HENT:     Ears:      Comments: Dilated port of winer with characteristics of secondary infection on left ear.     Impression and Plan:  Dilated pore of Winer -     Ambulatory referral to Dermatology   -Will need excision.  Is already in the process of being treated with 10 days of doxycycline.  Urgent referral to dermatology  placed.  Time spent:20 minutes reviewing chart, interviewing and examining patient and formulating plan of care.     Chaya Jan, MD  Primary Care at [facility]

## 2023-10-31 ENCOUNTER — Encounter: Payer: Self-pay | Admitting: Internal Medicine

## 2023-10-31 DIAGNOSIS — L089 Local infection of the skin and subcutaneous tissue, unspecified: Secondary | ICD-10-CM

## 2023-11-03 ENCOUNTER — Encounter: Payer: Self-pay | Admitting: Internal Medicine

## 2023-11-03 MED ORDER — CYANOCOBALAMIN 1000 MCG/ML IJ SOLN: INTRAMUSCULAR | 11 refills | Status: AC

## 2023-11-06 NOTE — Telephone Encounter (Signed)
Attempted to contact patient but both phones on file was going straight to voicemail. I was unable to leave voicemail as well due to box being full.  No where in [place] had any openings for soon but I was able to get patient scheduled for [facility].  Patient is scheduled for 2:45pm today.  The address is [address] [place], [place] [address].  Their phone number is [phone].  Patient is to bring insurance and ID.  Thanks, Gardiner Sleeper

## 2023-11-10 ENCOUNTER — Telehealth: Payer: Self-pay | Admitting: Internal Medicine

## 2023-11-10 ENCOUNTER — Encounter: Payer: Self-pay | Admitting: *Deleted

## 2023-11-10 NOTE — Telephone Encounter (Signed)
Rowena, RN with [facility]  will let the patient know.  Message sent to Mychart for daughter.

## 2023-11-10 NOTE — Telephone Encounter (Signed)
Lowell Guitar, RN with [facility] [phone]  Needs clarification on who will give Pt his shots, as they do not do that.

## 2023-11-19 ENCOUNTER — Encounter: Payer: Self-pay | Admitting: Internal Medicine

## 2023-11-19 DIAGNOSIS — C44229 Squamous cell carcinoma of skin of left ear and external auricular canal: Secondary | ICD-10-CM | POA: Diagnosis not present

## 2023-11-24 NOTE — Telephone Encounter (Signed)
Fax sent and confirmed to [facility] [phone].

## 2023-11-27 ENCOUNTER — Encounter: Payer: Self-pay | Admitting: Internal Medicine

## 2023-12-25 ENCOUNTER — Encounter: Payer: Self-pay | Admitting: Dermatology

## 2023-12-25 ENCOUNTER — Ambulatory Visit: Payer: 59 | Admitting: Dermatology

## 2023-12-25 VITALS — BP 132/63 | HR 74 | Temp 98.0°F

## 2023-12-25 DIAGNOSIS — C44229 Squamous cell carcinoma of skin of left ear and external auricular canal: Secondary | ICD-10-CM | POA: Diagnosis not present

## 2023-12-25 DIAGNOSIS — D492 Neoplasm of unspecified behavior of bone, soft tissue, and skin: Secondary | ICD-10-CM

## 2023-12-25 DIAGNOSIS — C4491 Basal cell carcinoma of skin, unspecified: Secondary | ICD-10-CM

## 2023-12-25 MED ORDER — OXYCODONE HCL 5 MG PO TABS: 5.0000 mg | ORAL_TABLET | Freq: Four times a day (QID) | ORAL | 0 refills | Status: DC | PRN

## 2023-12-25 MED ORDER — IBUPROFEN 200 MG PO TABS: 200.0000 mg | ORAL_TABLET | Freq: Four times a day (QID) | ORAL | 0 refills | Status: AC | PRN

## 2023-12-25 MED ORDER — ACETAMINOPHEN 500 MG PO TABS: 500.0000 mg | ORAL_TABLET | Freq: Four times a day (QID) | ORAL | 0 refills | Status: DC | PRN

## 2023-12-25 NOTE — Progress Notes (Signed)
 Follow-Up Visit   Subjective  Anthony Skinner is a 83 y.o. male who presents for the following: Mohs of suspected NMSC in left helix/left conchal bowl. 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 Patient did not want to go to [facility]. His son comes to [facility] as a patient. Patient's daughter is accompanying the patient and helps provide the history.   No current record of biopsy for this patient's suspicious clinical lesion. Records sent by outside dermatologist are listed under a different patient name and DOB, which do not match our patient.  The following portions of the chart were reviewed this encounter and updated as appropriate: medications, allergies, medical history  Review of Systems:  No other skin or systemic complaints except as noted in HPI or Assessment and Plan.  Objective  Well appearing patient in no apparent distress; mood and affect are within normal limits.  A focused examination was performed of the following areas: Left helix/concha bowl Relevant physical exam findings are noted in the Assessment and Plan.   Left helix/concha bowl    Assessment & Plan   BASAL CELL CARCINOMA (BCC), UNSPECIFIED SITE Left helix/concha bowl Mohs surgery  Consent obtained: written  Anticoagulation: Is the patient taking prescription anticoagulant and/or aspirin  prescribed/recommended by a physician? No   Was the anticoagulation regimen changed prior to Mohs? No    Anesthesia: Anesthesia method: local infiltration Local anesthetic: lidocaine  1% WITH epi  Procedure Details: Timeout: pre-procedure verification complete Procedure Prep: patient was prepped and draped in usual sterile fashion Prep type: povidone-iodine Frozen section biopsy performed: Yes   Pre-Op diagnosis: squamous cell carcinoma SCC subtype: well differentiated MohsAIQ Surgical site (if tumor spans multiple areas, please select  predominant area): ear Surgical site (from skin exam): Left helix/concha bowl Pre-operative length (cm): 1.5 Pre-operative width (cm): 1.5 Indications for Mohs surgery: anatomic location where tissue conservation is critical  Micrographic Surgery Details: Post-operative length (cm): 3 Post-operative width (cm): 2.8 Number of Mohs stages: 2 Related Medications oxyCODONE  (OXY IR/ROXICODONE ) 5 MG immediate release tablet Take 1 tablet (5 mg total) by mouth every 6 (six) hours as needed for severe pain (pain score 7-10).   Return in about 10 days (around 01/04/2024) for wound check.  LILLETTE Darice Smock, CMA, am acting as scribe for RUFUS CHRISTELLA HOLY, MD.    12/25/2023  HISTORY OF PRESENT ILLNESS  Anthony Skinner is seen in consultation at the request of the patient for clinically suspicious lesion on the left conchal bowl/helix.  They note that the area has been present for about 6-12 months increasing in size with time and tenderness.  There is no history of previous treatment.  Reports no other new or changing lesions and has no other complaints today.  The patient's son is a current patient of [facility] and recommended that his father have his lesion treated with our team. Current records show a biopsy with a different patient name and DOB, which do not match our patient.   Medications and allergies: see patient chart.  Review of systems: Reviewed 8 systems and notable for the above skin cancer.  All other systems reviewed are unremarkable/negative, unless noted in the HPI. Past medical history, surgical history, family history, social history were also reviewed and are noted in the chart/questionnaire.    PHYSICAL EXAMINATION  General: Well-appearing, in no acute distress, alert and oriented x 4. Vitals reviewed in chart (if available).   Skin: Exam  reveals a 1.5 x 1.5 cm erythematous papule and biopsy scar on the left conchal bowl and helix. There are rhytids, telangiectasias, and  lentigines, consistent with photodamage.  Biopsy report(s) reviewed, confirming the diagnosis.   ASSESSMENT  1) Clinically Suspicious Neoplasm on Left conchal Bowl and Helix, confirmed today with Biopsy with Frozen section as Well Differentiated Squamous Cell Carcinoma 2) photodamage 3) solar lentigines   Biopsy results were not initially available to our time as the practice had sent over a different patient's information. In order to proceed with the appropriate treatment, we did a shave biopsy with frozen section of the lesion in order to do Mohs surgery for an approved indication. After the frozen section biopsy, we diagnosed Well Differentiated Squamous Cell Carcinoma and proceeded with Mohs surgery.  GROSS HISTOLOGY FINDINGS OF FROZEN SECTION SHAVE BIOPSY  The frozen section biopsy of the lesion showed a well-differentiated squamous cell carcinoma (SCC). Grossly, the specimen consisted of a firm, tan-colored tissue fragment measuring approximately 1.0 x 0.8 cm. Upon microscopic examination, the tumor was composed of atypical keratinocytes with enlarged, hyperchromatic nuclei, prominent nucleoli, and abundant keratinization. The tumor cells were arranged in nests and islands that infiltrated the dermis, with evidence of focal desmoplastic reaction in the surrounding tissue. The borders of the lesion were well-defined, with a pushing margin at the periphery. There was no evidence of deep invasion or lymphovascular involvement, and the tumor appeared confined to the dermis, consistent with a well-differentiated SCC.   PLAN   1. Due to location, size, histology, or recurrence and the likelihood of subclinical extension as well as the need to conserve normal surrounding tissue, the patient was deemed acceptable for Mohs micrographic surgery (MMS).  The nature and purpose of the procedure, associated benefits and risks including recurrence and scarring, possible complications such as pain,  infection, and bleeding, and alternative methods of treatment if appropriate were discussed with the patient during consent. The lesion location was verified by the patient, by reviewing previous notes, pathology reports, and by photographs as well as angulation measurements if available.  Informed consent was reviewed and signed by the patient, and timeout was performed at 9:15 AM. See op note below.  2. For the photodamage and solar lentigines, sun protection discussed/information given on OTC sunscreens, and we recommend continued regular follow-up with primary dermatologist every 6 months or sooner for any growing, bleeding, or changing lesions. 3. Prognosis and future surveillance discussed. 4. Letter with treatment outcome sent to referring provider. 5. Pain acetaminophen /ibuprofen /oxycodone  5 mg   MOHS MICROGRAPHIC SURGERY AND RECONSTRUCTION  Initial size:   1.5 x 1.5 cm Surgical defect/wound size: 3.0 x 2.8 cm Anesthesia:    0.33% lidocaine  with 1:200,000 epinephrine EBL:    <5 mL Complications:  None Repair type:   Full Thickness Skin Graft SQ suture:   5-0 Monocryl Cutaneous suture:  6-0 Plain gut Final size of the repair: 8.4 cm^2  Stages: 2  STAGE I: Anesthesia achieved with 0.5% lidocaine  with 1:200,000 epinephrine. ChloraPrep applied. 2 section(s) excised using Mohs technique (this includes total peripheral and deep tissue margin excision and evaluation with frozen sections, excised and interpreted by the same physician). The tumor was first debulked and then excised with an approx. 2 mm margin.  Hemostasis was achieved with electrocautery as needed.  The specimen was then oriented, subdivided/relaxed, inked, and processed using Mohs technique.    Frozen section analysis revealed a positive margin for atypical epithelial cells with squamous differentiation in the dermis in the  peripheral margin.    STAGE II: An additional 2 mm margin was excised.  Hemostasis was achieved with  electrocautery as needed.  The specimen was then oriented, subdivided/relaxed, inked, and processed using Mohs technique. Evaluation of slides by the Mohs surgeon revealed clear tumor margins.  Reconstruction  Operation:  Full thickness skin graft repair of the above MMS defect Indication:  Functional and aesthetic reconstruction of the above wound Donor site:  Left postauricular neck Donor site suture: 4-0 Vicryl     FTSG suture:  6-0 Plain Gut Graft length and width: 3.0 x 2.8 cm Graft surface area: 8.4 cm2 Postoperative medications: None Complications:  None  Due to the size, depth, and location, apposition could not be obtained without significant tension and distortion of adjacent tissue and structures. We discussed repair options including graft, flap, and second intention. We chose to proceed with a Full Thickness Skin Graft.  Therefore, a template/measurement of the defect was made to pattern the excision in the donor site that was then aseptically prepped, anesthetized, and excised.  The donor defect was closed without significant tension.  Hemostasis was achieved with electrocautery.  The graft was then defatted, trimmed, and placed on the recipient site.  The circumference of the graft was secured with the above suture.  Internal bolstering stitches were placed as needed.  The surgical site was then lightly scrubbed with sterile, saline-soaked gauze.  A bolster dressing was sutured onto the graft. The area was then bandaged using Vaseline ointment, non-adherent gauze, gauze pads, and tape to provide an adequate pressure dressing.   The patient tolerated the procedure well, was given detailed written and verbal wound care instructions, and was discharged in good condition.  Sun protection and sun avoidance was also discussed. The patient will follow-up in 10 days.   Documentation: I have reviewed the above documentation for accuracy and completeness, and I agree with the above.  RUFUS CHRISTELLA HOLY, MD

## 2023-12-25 NOTE — Patient Instructions (Signed)

## 2023-12-25 NOTE — Progress Notes (Deleted)
 Marland Kitchen

## 2023-12-31 ENCOUNTER — Encounter: Payer: Self-pay | Admitting: Dermatology

## 2024-01-06 ENCOUNTER — Encounter: Payer: Self-pay | Admitting: Dermatology

## 2024-01-06 ENCOUNTER — Ambulatory Visit (INDEPENDENT_AMBULATORY_CARE_PROVIDER_SITE_OTHER): Payer: 59 | Admitting: Dermatology

## 2024-01-06 DIAGNOSIS — Z48817 Encounter for surgical aftercare following surgery on the skin and subcutaneous tissue: Secondary | ICD-10-CM

## 2024-01-06 DIAGNOSIS — Z85828 Personal history of other malignant neoplasm of skin: Secondary | ICD-10-CM

## 2024-01-06 DIAGNOSIS — C4491 Basal cell carcinoma of skin, unspecified: Secondary | ICD-10-CM

## 2024-01-06 DIAGNOSIS — T1490XD Injury, unspecified, subsequent encounter: Secondary | ICD-10-CM

## 2024-01-06 MED ORDER — GENTAMICIN SULFATE 0.1 % EX OINT: 1.0000 | TOPICAL_OINTMENT | Freq: Three times a day (TID) | CUTANEOUS | 0 refills | Status: AC

## 2024-01-06 MED ORDER — MUPIROCIN 2 % EX OINT: 1.0000 | TOPICAL_OINTMENT | Freq: Three times a day (TID) | CUTANEOUS | 0 refills | Status: AC

## 2024-01-06 NOTE — Progress Notes (Signed)
   Follow Up Visit   Subjective  Anthony Skinner is a 83 y.o. male who presents for the following: follow up from Mohs surgery, he is accompanied by his son.  The patient presents for follow up from Mohs surgery for a BCC on the left conchal bowl, treated on 12/25/23, repaired with FTSG. The patient has been bandaging the wound as directed. The endorse the following concerns: Remove stitches.No questions or concerns at this time.He has had the bolster stitched in and has not had any issues with it.    The following portions of the chart were reviewed this encounter and updated as appropriate: medications, allergies, medical history  Review of Systems:  No other skin or systemic complaints except as noted in HPI or Assessment and Plan.  Objective  Well appearing patient in no apparent distress; mood and affect are within normal limits.  A full examination was performed including scalp, head, face and ear. All findings within normal limits unless otherwise noted below.  Healing wound with mild erythema  Relevant physical exam findings are noted in the Assessment and Plan.         Assessment & Plan   Healing s/p Mohs for BCC, treated on left concha bowl, repaired with FTSG. - Reassured that wound is healing well - No evidence of infection - No swelling, induration, purulence, dehiscence, or tenderness out of proportion to the clinical exam, see photo above - Discussed that scars take up to 12 months to mature from the date of surgery - Recommend SPF 30+ to scar daily to prevent purple color from UV exposure during scar maturation process - Discussed that erythema and raised appearance of scar will fade over the next 4-6 months - Will apply mupirocin and gentamicin mixed together TID to wound covered with a bandage - Will see the patient back in a week to remove the sutures.  BASAL CELL CARCINOMA (BCC), UNSPECIFIED SITE Left helix/concha bowl Remove sutures and check on graft  healing. Related Medications oxyCODONE (OXY IR/ROXICODONE) 5 MG immediate release tablet Take 1 tablet (5 mg total) by mouth every 6 (six) hours as needed for severe pain (pain score 7-10). mupirocin ointment (BACTROBAN) 2 % Apply 1 Application topically 3 (three) times daily. To ear mixed with gentamicin covered with a bandage gentamicin ointment (GARAMYCIN) 0.1 % Apply 1 Application topically 3 (three) times daily. Mixed with mupirocin applied to graft, covered with a bandage  Return in about 1 week (around 01/13/2024).   Documentation: I have reviewed the above documentation for accuracy and completeness, and I agree with the above.  Gwenith Daily, MD

## 2024-01-13 ENCOUNTER — Ambulatory Visit (INDEPENDENT_AMBULATORY_CARE_PROVIDER_SITE_OTHER): Payer: 59 | Admitting: Dermatology

## 2024-01-13 ENCOUNTER — Encounter: Payer: Self-pay | Admitting: Dermatology

## 2024-01-13 VITALS — BP 115/62 | HR 66

## 2024-01-13 DIAGNOSIS — L570 Actinic keratosis: Secondary | ICD-10-CM | POA: Diagnosis not present

## 2024-01-13 DIAGNOSIS — W908XXA Exposure to other nonionizing radiation, initial encounter: Secondary | ICD-10-CM

## 2024-01-13 NOTE — Progress Notes (Signed)
Follow Up Visit   Subjective  Anthony Skinner is a 83 y.o. male who presents for the following: follow up from Mohs surgery   The patient presents for follow up from Mohs surgery for a BCC on the left helix/concha bowl, treated on 12/25/23, repaired with FTSG. The patient has been bandaging the wound as directed. The endorse the following concerns: crustiness and tenderness.  He is accompanied by his daughter. Patient and daughter endorse difficulty getting mupirocin and gentamicin Rxs from the [facility] the [facility], despite Rxs sent to the facility. Facility did not permit patient to apply ointments to wound as directed at last visit.   The following portions of the chart were reviewed this encounter and updated as appropriate: medications, allergies, medical history  Review of Systems:  No other skin or systemic complaints except as noted in HPI or Assessment and Plan.  Objective  Well appearing patient in no apparent distress; mood and affect are within normal limits.  A full examination was performed including scalp, head, face and ear. All findings within normal limits unless otherwise noted below.  Healing wound with mild erythema  Relevant physical exam findings are noted in the Assessment and Plan.    Assessment & Plan    Healing s/p Mohs for [facility], treated on 12/25/23, repaired with FTSG; partial graft failure secondary to inadequate ointment and coverage - Reassured that wound is healing well - No evidence of infection - Recommend start gentamicin and mupirocin, letter written in Epic to be sent to care facility and  - Discussed that graft had partial thickness success but we will continue to help the rest of the area healing by secondary intention with copious amounts of ointment and bandaging. - Sutures removed today - Ok to continue ointment daily to wound under a bandage for another week  ACTINIC KERATOSIS Exam: Erythematous thin papules/macules  with gritty scale  Actinic keratoses are precancerous spots that appear secondary to cumulative UV radiation exposure/sun exposure over time. They are chronic with expected duration over 1 year. A portion of actinic keratoses will progress to squamous cell carcinoma of the skin. It is not possible to reliably predict which spots will progress to skin cancer and so treatment is recommended to prevent development of skin cancer.  Recommend daily broad spectrum sunscreen SPF 30+ to sun-exposed areas, reapply every 2 hours as needed.  Recommend staying in the shade or wearing long sleeves, sun glasses (UVA+UVB protection) and wide brim hats (4-inch brim around the entire circumference of the hat). Call for new or changing lesions.  Treatment Plan:  Prior to procedure, discussed risks of blister formation, small wound, skin dyspigmentation, or rare scar following cryotherapy. Recommend Vaseline ointment to treated areas while healing.  Destruction Procedure Note Destruction method: cryotherapy   Informed consent: discussed and consent obtained   Lesion destroyed using liquid nitrogen: Yes   Outcome: patient tolerated procedure well with no complications   Post-procedure details: wound care instructions given   Locations: nasal tip, right cheek, right helix # of Lesions Treated: 3  AK (ACTINIC KERATOSIS) (3) Right Ear, right cheek and nasal tip (3) Destruction of lesion - Right Ear, right cheek and nasal tip (3) Complexity: simple   Destruction method: cryotherapy   Informed consent: discussed and consent obtained   Timeout:  patient name, date of birth, surgical site, and procedure verified Lesion destroyed using liquid nitrogen: Yes   Outcome: patient tolerated procedure well with no complications   Post-procedure  details: wound care instructions given    Return in about 2 weeks (around 01/27/2024) for wound check.  I, Tillie Fantasia, CMA, am acting as scribe for Gwenith Daily, MD.    Documentation: I have reviewed the above documentation for accuracy and completeness, and I agree with the above.  Gwenith Daily, MD

## 2024-01-13 NOTE — Patient Instructions (Addendum)
Apply gentamicin mixed with mupirocin to ear 3 times a day then cover with bandage.   Important Information   Due to recent changes in healthcare laws, you may see results of your pathology and/or laboratory studies on MyChart before the doctors have had a chance to review them. We understand that in some cases there may be results that are confusing or concerning to you. Please understand that not all results are received at the same time and often the doctors may need to interpret multiple results in order to provide you with the best plan of care or course of treatment. Therefore, we ask that you please give [place] 2 business days to thoroughly review all your results before contacting the office for clarification. Should we see a critical lab result, you will be contacted sooner.     If You Need Anything After Your Visit   If you have any questions or concerns for your doctor, please call our main line at [phone]. If no one answers, please leave a voicemail as directed and we will return your call as soon as possible. Messages left after 4 pm will be answered the following business day.    You may also send [place] a message via MyChart. We typically respond to MyChart messages within 1-2 business days.  For prescription refills, please ask your pharmacy to contact our office. Our fax number is [phone].  If you have an urgent issue when the clinic is closed that cannot wait until the next business day, you can page your doctor at the number below.     Please note that while we do our best to be available for urgent issues outside of office hours, we are not available 24/7.    If you have an urgent issue and are unable to reach [place], you may choose to seek medical care at your doctor's office, retail clinic, urgent care center, or emergency room.   If you have a medical emergency, please immediately call 911 or go to the emergency department. In the event of inclement weather, please call  our main line at [phone] for an update on the status of any delays or closures.  Dermatology Medication Tips: Please keep the boxes that topical medications come in in order to help keep track of the instructions about where and how to use these. Pharmacies typically print the medication instructions only on the boxes and not directly on the medication tubes.   If your medication is too expensive, please contact our office at [phone] or send [place] a message through MyChart.    We are unable to tell what your co-pay for medications will be in advance as this is different depending on your insurance coverage. However, we may be able to find a substitute medication at lower cost or fill out paperwork to get insurance to cover a needed medication.    If a prior authorization is required to get your medication covered by your insurance company, please allow [place] 1-2 business days to complete this process.   Drug prices often vary depending on where the prescription is filled and some pharmacies may offer cheaper prices.   The website www.goodrx.com contains coupons for medications through different pharmacies. The prices here do not account for what the cost may be with help from insurance (it may be cheaper with your insurance), but the website can give you the price if you did not use any insurance.  - You can print the associated coupon and take it  with your prescription to the pharmacy.  - You may also stop by our office during regular business hours and pick up a GoodRx coupon card.  - If you need your prescription sent electronically to a different pharmacy, notify our office through [facility] or by phone at [phone]

## 2024-01-28 ENCOUNTER — Ambulatory Visit (INDEPENDENT_AMBULATORY_CARE_PROVIDER_SITE_OTHER): Payer: 59 | Admitting: Dermatology

## 2024-01-28 ENCOUNTER — Encounter: Payer: Self-pay | Admitting: Dermatology

## 2024-01-28 VITALS — BP 108/53

## 2024-01-28 DIAGNOSIS — L570 Actinic keratosis: Secondary | ICD-10-CM

## 2024-01-28 NOTE — Patient Instructions (Signed)

## 2024-01-28 NOTE — Progress Notes (Signed)
   Follow-Up Visit   Subjective  Anthony Skinner is a 83 y.o. male who presents for the following: Mohs follow up, for SCC in the left conchal bowl, treated on 12/25/2023, and repaired with FTSG. Partial skin graft failure after patient was unable to treat with topical ointments due to assisted living facility not allowing patient to apply medicines. He has since had has daughter place the medicines on the ear with improvement.   The following portions of the chart were reviewed this encounter and updated as appropriate: medications, allergies, medical history  Review of Systems:  No other skin or systemic complaints except as noted in HPI or Assessment and Plan.  Objective  Well appearing patient in no apparent distress; mood and affect are within normal limits.   A focused examination was performed of the following areas: Left ear  Relevant exam findings are noted in the Assessment and Plan.      Left Superior Helix Erythematous thin papules/macules with gritty scale.   Assessment & Plan   Healing s/p Mohs for [facility], treated on 12/25/23, repaired with FTSG; partial graft failure secondary to inadequate ointment and coverage- much improved since last visit - Area debrided today - No evidence of recurrence today - May discontinue gentamicin and mupirocin. Start thick layer of vaseline followed by bandage daily. - Will re-evaluate in 2 weeks  AK (ACTINIC KERATOSIS) Left Superior Helix AK VS [facility] Destruction of lesion - Left Superior Helix Complexity: simple   Destruction method: cryotherapy   Informed consent: discussed and consent obtained   Timeout:  patient name, date of birth, surgical site, and procedure verified Lesion destroyed using liquid nitrogen: Yes   Region frozen until ice ball extended beyond lesion: Yes   Outcome: patient tolerated procedure well with no complications   Post-procedure details: wound care instructions given    Return in about 10 days (around  02/07/2024) for Follow up.  I, Joanie Coddington, CMA, am acting as scribe for Gwenith Daily, MD .   Documentation: I have reviewed the above documentation for accuracy and completeness, and I agree with the above.  Gwenith Daily, MD

## 2024-02-09 ENCOUNTER — Ambulatory Visit (INDEPENDENT_AMBULATORY_CARE_PROVIDER_SITE_OTHER): Payer: 59 | Admitting: Dermatology

## 2024-02-09 ENCOUNTER — Encounter: Payer: Self-pay | Admitting: Dermatology

## 2024-02-09 VITALS — BP 121/65

## 2024-02-09 DIAGNOSIS — L570 Actinic keratosis: Secondary | ICD-10-CM

## 2024-02-09 DIAGNOSIS — T1490XD Injury, unspecified, subsequent encounter: Secondary | ICD-10-CM

## 2024-02-09 DIAGNOSIS — C4491 Basal cell carcinoma of skin, unspecified: Secondary | ICD-10-CM

## 2024-02-09 DIAGNOSIS — W908XXD Exposure to other nonionizing radiation, subsequent encounter: Secondary | ICD-10-CM

## 2024-02-09 NOTE — Progress Notes (Signed)
   Follow-Up Visit   Subjective  Anthony Skinner is a 83 y.o. male who presents for the following: Mohs follow up - BCC of left conchal bowl treated on 12/25/2023 and repaired with FTSG. Partial skin graft failure after patient was unable to treat with topical ointments due to assisted living facility not allowing patient to apply medicines. He has since had his daughter place the medicines on the ear with improvement.  Accompanied by daughter today.  The following portions of the chart were reviewed this encounter and updated as appropriate: medications, allergies, medical history  Review of Systems:  No other skin or systemic complaints except as noted in HPI or Assessment and Plan.  Objective  Well appearing patient in no apparent distress; mood and affect are within normal limits.  A focused examination was performed of the following areas:  Scalp  Bilateral Ears   Relevant exam findings are noted in the Assessment and Plan.      Assessment & Plan   Healing s/p Mohs for [facility], treated on 12/25/23, repaired with FTSG; partial graft failure secondary to inadequate ointment and coverage- much improved since last visit  - No evidence of recurrence today - Recommend regular full body skin exams - Recommend daily broad spectrum sunscreen SPF 30+ to sun-exposed areas, reapply every 2 hours as needed.  - Call if any new or changing lesions are noted between office visits - OK to use vaseline PRN  ACTINIC KERATOSIS Exam: Erythematous thin papules/macules with gritty scale at the bilateral ears and scalp s/p cryo, resolved  Actinic keratoses are precancerous spots that appear secondary to cumulative UV radiation exposure/sun exposure over time. They are chronic with expected duration over 1 year. A portion of actinic keratoses will progress to squamous cell carcinoma of the skin. It is not possible to reliably predict which spots will progress to skin cancer and so treatment is recommended  to prevent development of skin cancer.  Recommend daily broad spectrum sunscreen SPF 30+ to sun-exposed areas, reapply every 2 hours as needed.  Recommend staying in the shade or wearing long sleeves, sun glasses (UVA+UVB protection) and wide brim hats (4-inch brim around the entire circumference of the hat). Call for new or changing lesions.  Treatment Plan: Monitor for changes    Return in about 2 months (around 04/08/2024) for Follow up.  I, Joanie Coddington, CMA, am acting as scribe for Gwenith Daily, MD .   Documentation: I have reviewed the above documentation for accuracy and completeness, and I agree with the above.  Gwenith Daily, MD

## 2024-02-09 NOTE — Patient Instructions (Signed)

## 2024-04-01 ENCOUNTER — Encounter: Payer: Self-pay | Admitting: Family Medicine

## 2024-04-01 ENCOUNTER — Ambulatory Visit (INDEPENDENT_AMBULATORY_CARE_PROVIDER_SITE_OTHER): Admitting: Family Medicine

## 2024-04-01 ENCOUNTER — Telehealth: Payer: Self-pay

## 2024-04-01 VITALS — BP 110/66 | HR 78 | Temp 98.1°F | Ht 72.0 in | Wt 177.6 lb

## 2024-04-01 DIAGNOSIS — K529 Noninfective gastroenteritis and colitis, unspecified: Secondary | ICD-10-CM

## 2024-04-01 DIAGNOSIS — R109 Unspecified abdominal pain: Secondary | ICD-10-CM | POA: Diagnosis not present

## 2024-04-01 DIAGNOSIS — R3129 Other microscopic hematuria: Secondary | ICD-10-CM | POA: Diagnosis not present

## 2024-04-01 LAB — POCT URINALYSIS DIPSTICK
Bilirubin, UA: NEGATIVE
Blood, UA: POSITIVE
Glucose, UA: NEGATIVE
Ketones, UA: NEGATIVE
Leukocytes, UA: NEGATIVE
Nitrite, UA: NEGATIVE
Protein, UA: NEGATIVE
Spec Grav, UA: 1.015 (ref 1.010–1.025)
Urobilinogen, UA: NEGATIVE U/dL — AB
pH, UA: 6 (ref 5.0–8.0)

## 2024-04-01 NOTE — Progress Notes (Signed)
 Established Patient Office Visit   Subjective  Patient ID: Anthony Skinner, male    DOB: 12/16/1941  Age: 83 y.o. MRN: [id number]  Chief Complaint  Patient presents with   Abdominal Pain    Pt is with daughter and  c/o abdomen pain on MQ. Sx started Tuesday of this week. Also had diarrhea, vomiting, nausea. Last episode on Wednesday. Was given a zofran.    Chest Pain    Pt reports chest pain yesterday. One episode. Was given aspirin. Today pt states no chest pain. Daughter mention wife passed away in 12/24/24.   Pt accompanied by his daughter  Pt is an 83 yo male followed by Dr. Ardyth Harps and seen for acute concern.  Patient endorses having abdominal pain, nausea, vomiting x 7, and diarrhea x 4 earlier this week.  Symptoms have since resolved.  Patient endorses losing his dentures during the episodes of emesis.  Patient also mentions episode of CP yesterday.  Described as a pressure that may have lasted 2 hours.  Given aspirin and symptoms resolved in 1 hour.  Patient denies headaches, blurred vision, fever, chills, neck pain with radiation into arm, SOB, GERD.  Patient's daughter mentions that patient lost his wife in 25-Dec-2023.    Patient Active Problem List   Diagnosis Date Noted   Vitamin D deficiency 08/03/2020   Urethral stricture 07/23/2017   Acute kidney injury (HCC) 02/14/2017   GERD (gastroesophageal reflux disease) 02/14/2017   Severe major depression with psychotic features (HCC) 02/13/2017   Urinary urgency 09/20/2016   Moderate episode of recurrent major depressive disorder (HCC) 10/14/2015   Suicidal thoughts    Dementia with behavioral disturbance (HCC) 09/01/2015   Dementia (HCC)    Major depressive disorder without psychotic features (HCC) 05/24/2015   Aggressive behavior    SUI (stress urinary incontinence), male 11/28/2014   Malignant neoplasm of prostate (HCC) 05/23/2014   Chest pain 09/15/2013   Coronary atherosclerosis of native coronary artery  09/15/2013   Hypokalemia 08/28/2013   Acute respiratory failure (HCC) 08/25/2013   Altered mental status 08/25/2013   Anoxic brain injury (HCC) 08/25/2013   HTN (hypertension) 08/25/2013   Suicide attempt (HCC) 08/25/2013   Vitamin B 12 deficiency 07/15/2013   Hereditary and idiopathic peripheral neuropathy 07/15/2013   Memory loss 07/15/2013   Past Medical History:  Diagnosis Date   Alzheimer disease (HCC)    CAD (coronary artery disease)    a. reported h/o MI in the 90's;  b. 04/2000 Cath: LM nl, LAD 40p, D1 small, nl, RI nl, LCX nl, RCA nl.   Dementia (HCC)    Depression    DJD (degenerative joint disease)    Fatty liver    Gastropathy 2012   reactive   GERD (gastroesophageal reflux disease)    Hiatal hernia    Hyperlipidemia    Hypertension    Hypertension    Iron deficiency anemia    Myocardial infarction (HCC)    " BACK IN THE 90'S"   Prostate cancer (HCC)    a. 09/2008 s/p prostatectomy.   PUD (peptic ulcer disease)    Recurrent spontaneous pneumothorax    a. s/p L lobectomy in 1966.   Shortness of breath    Suicide attempt (HCC)    a. 08/2013 attempt by hanging with subsequent resp failure   Syncope    a. in setting of GIB in 2012, presumed to be orthostatic.   Tubular adenoma of colon 2012   Upper GI bleed  a. 2012   Vertebral artery stenosis    a. 09/2010 s/p L vertebral stenting 09/2010.   Past Surgical History:  Procedure Laterality Date   CARDIAC CATHETERIZATION  09/15/2013   CARDIAC SURGERY     CATARACT EXTRACTION Right    LEFT HEART CATHETERIZATION WITH CORONARY ANGIOGRAM N/A 09/15/2013   Procedure: LEFT HEART CATHETERIZATION WITH CORONARY ANGIOGRAM;  Surgeon: Odie Benne, MD;  Location: [facility] CATH LAB;  Service: Cardiovascular;  Laterality: N/A;   LUNG REMOVAL, PARTIAL  1960s   left   PROSTATECTOMY     vertebral artery stent     Social History   Tobacco Use   Smoking status: Former    Current packs/day: 0.00    Average packs/day: 1  pack/day for 40.0 years (40.0 ttl pk-yrs)    Types: Cigarettes    Start date: 08/27/1962    Quit date: 08/27/2002    Years since quitting: 21.6   Smokeless tobacco: Never  Vaping Use   Vaping status: Never Used  Substance Use Topics   Alcohol use: No    Comment: Hx heavy EtOH use but quit 2011   Drug use: No   Family History  Problem Relation Age of Onset   Alzheimer's disease Mother    Heart attack Father    Prostate cancer Brother    No Known Allergies    ROS Negative unless stated above    Objective:     BP 110/66 (BP Location: Left Arm, Patient Position: Sitting, Cuff Size: Normal)   Pulse 78   Temp 98.1 F (36.7 C) (Oral)   Ht 6' (1.829 m)   Wt 177 lb 9.6 oz (80.6 kg)   SpO2 96%   BMI 24.09 kg/m  BP Readings from Last 3 Encounters:  04/01/24 110/66  02/09/24 121/65  01/28/24 (!) 108/53   Wt Readings from Last 3 Encounters:  04/01/24 177 lb 9.6 oz (80.6 kg)  10/30/23 174 lb (78.9 kg)  09/03/23 172 lb 4.8 oz (78.2 kg)    Physical Exam Constitutional:      General: He is not in acute distress.    Appearance: Normal appearance.  HENT:     Head: Normocephalic and atraumatic.     Nose: Nose normal.     Mouth/Throat:     Mouth: Mucous membranes are moist.     Comments: Edentulous. Cardiovascular:     Rate and Rhythm: Normal rate and regular rhythm.     Heart sounds: Normal heart sounds. No murmur heard.    No gallop.  Pulmonary:     Effort: Pulmonary effort is normal. No respiratory distress.     Breath sounds: Normal breath sounds. No wheezing, rhonchi or rales.  Abdominal:     General: Abdomen is flat. Bowel sounds are normal.     Palpations: Abdomen is soft.     Tenderness: There is generalized abdominal tenderness.  Skin:    General: Skin is warm and dry.  Neurological:     Mental Status: He is alert and oriented to person, place, and time.      Results for orders placed or performed in visit on 04/01/24  POC Urinalysis Dipstick  Result  Value Ref Range   Color, UA dark yellow    Clarity, UA clear    Glucose, UA Negative Negative   Bilirubin, UA negative    Ketones, UA negative    Spec Grav, UA 1.015 1.010 - 1.025   Blood, UA positive    pH, UA 6.0 5.0 -  8.0   Protein, UA Negative Negative   Urobilinogen, UA negative (A) 0.2 or 1.0 E.U./dL   Nitrite, UA negative    Leukocytes, UA Negative Negative   Appearance     Odor        Assessment & Plan:  Gastroenteritis  Abdominal pain, unspecified abdominal location -     POCT urinalysis dipstick  Other microscopic hematuria -     Urinalysis, Routine w reflex microscopic  Patient with acute vomiting and loose stools that have since resolved.  Symptoms likely 2/2 viral gastroenteritis as resides in NH setting.  Continue supportive care including hydration, advance diet as tolerated.  Soft foods as pt lost dentures.  Given strict precautions for any episodes of CP.  POC UA with 1+ RBCs.  UA with micro ordered.  Return if symptoms worsen or fail to improve.   Viola Greulich, MD

## 2024-04-01 NOTE — Telephone Encounter (Signed)
 Pt was seen with Dr. Arliss Lam today with daughter. Daughter brought FL2 for provider to sign.   Inform pt form will be given to PCP provider when she come back. Daughter verbalized understanding.   Form in navy blue folder placed on Rachel's desk.

## 2024-04-02 ENCOUNTER — Encounter: Payer: Self-pay | Admitting: Family Medicine

## 2024-04-02 LAB — URINALYSIS, ROUTINE W REFLEX MICROSCOPIC
Bacteria, UA: NONE SEEN /HPF
Bilirubin Urine: NEGATIVE
Glucose, UA: NEGATIVE
Ketones, ur: NEGATIVE
Leukocytes,Ua: NEGATIVE
Nitrite: NEGATIVE
Protein, ur: NEGATIVE
RBC / HPF: NONE SEEN /HPF (ref 0–2)
Specific Gravity, Urine: 1.009 (ref 1.001–1.035)
Squamous Epithelial / HPF: NONE SEEN /HPF (ref <=5)
WBC, UA: NONE SEEN /HPF (ref 0–5)
pH: 5.5 (ref 5.0–8.0)

## 2024-04-02 LAB — MICROSCOPIC MESSAGE

## 2024-04-05 NOTE — Telephone Encounter (Signed)
 Placed on Dr Hardie Shackleton desk

## 2024-04-08 ENCOUNTER — Encounter: Payer: Self-pay | Admitting: Dermatology

## 2024-04-08 ENCOUNTER — Ambulatory Visit: Payer: 59 | Admitting: Dermatology

## 2024-04-08 ENCOUNTER — Encounter: Payer: Self-pay | Admitting: Internal Medicine

## 2024-04-08 VITALS — BP 128/72 | HR 85

## 2024-04-08 DIAGNOSIS — C4491 Basal cell carcinoma of skin, unspecified: Secondary | ICD-10-CM

## 2024-04-08 DIAGNOSIS — Z85828 Personal history of other malignant neoplasm of skin: Secondary | ICD-10-CM | POA: Diagnosis not present

## 2024-04-08 DIAGNOSIS — L905 Scar conditions and fibrosis of skin: Secondary | ICD-10-CM

## 2024-04-08 DIAGNOSIS — L570 Actinic keratosis: Secondary | ICD-10-CM

## 2024-04-08 NOTE — Progress Notes (Signed)
   Follow-Up Visit   Subjective  DELOS KLICH is a 83 y.o. male who presents for the following: Follow up to recheck Aks after treatment with cryo. He is s/p Mohs for a BCC in the left ear, treated on 12/25/2023, repaired with FTSG.   The patient has spots, moles and lesions to be evaluated, some may be new or changing and the patient may have concern these could be cancer.   The following portions of the chart were reviewed this encounter and updated as appropriate: medications, allergies, medical history  Review of Systems:  No other skin or systemic complaints except as noted in HPI or Assessment and Plan.  Objective  Well appearing patient in no apparent distress; mood and affect are within normal limits.  A focused examination was performed of the following areas: Bilateral ears and scalp  Relevant exam findings are noted in the Assessment and Plan.  Right Dorsal Hand Erythematous thin papules/macules with gritty scale.   Assessment & Plan   Healing s/p Mohs for [facility], treated on 12/25/23, repaired with FTSG; partial graft failure secondary to inadequate ointment and coverage- much improved since last visit; healed - No evidence of recurrence today - Recommend regular full body skin exams - Recommend daily broad spectrum sunscreen SPF 30+ to sun-exposed areas, reapply every 2 hours as needed.   HISTORY OF BASAL CELL CARCINOMA OF THE SKIN - No evidence of recurrence today - Recommend regular full body skin exams - Recommend daily broad spectrum sunscreen SPF 30+ to sun-exposed areas, reapply every 2 hours as needed.  - Call if any new or changing lesions are noted between office visits  HISTORY OF PRECANCEROUS ACTINIC KERATOSIS - site(s) of PreCancerous Actinic Keratosis clear today. - these may recur and new lesions may form requiring treatment to prevent transformation into skin cancer - observe for new or changing spots and contact [facility] for appointment if  occur - photoprotection with sun protective clothing; sunglasses and broad spectrum sunscreen with SPF of at least 30 + and frequent self skin exams recommended - yearly exams by a dermatologist recommended for persons with history of PreCancerous Actinic Keratoses  AK (ACTINIC KERATOSIS) Right Dorsal Hand Destruction of lesion - Right Dorsal Hand Complexity: simple   Destruction method: cryotherapy   Timeout:  patient name, date of birth, surgical site, and procedure verified Outcome: patient tolerated procedure well with no complications   Post-procedure details: wound care instructions given   SCAR   BASAL CELL CARCINOMA (BCC), UNSPECIFIED SITE   Related Medications mupirocin  ointment (BACTROBAN ) 2 % Apply 1 Application topically 3 (three) times daily. To ear mixed with gentamicin  covered with a bandage gentamicin  ointment (GARAMYCIN ) 0.1 % Apply 1 Application topically 3 (three) times daily. Mixed with mupirocin  applied to graft, covered with a bandage  Return for TBSE 3-4 months.  I, Wilson Hasten, CMA, am acting as scribe for Deneise Finlay, MD.   Documentation: I have reviewed the above documentation for accuracy and completeness, and I agree with the above.  Deneise Finlay, MD

## 2024-04-08 NOTE — Patient Instructions (Signed)

## 2024-06-07 ENCOUNTER — Telehealth: Payer: Self-pay | Admitting: *Deleted

## 2024-06-07 NOTE — Telephone Encounter (Signed)
 Entered in error

## 2024-07-08 ENCOUNTER — Encounter: Payer: Self-pay | Admitting: Dermatology

## 2024-07-08 ENCOUNTER — Ambulatory Visit: Admitting: Dermatology

## 2024-07-08 DIAGNOSIS — L905 Scar conditions and fibrosis of skin: Secondary | ICD-10-CM | POA: Diagnosis not present

## 2024-07-08 DIAGNOSIS — Z1283 Encounter for screening for malignant neoplasm of skin: Secondary | ICD-10-CM | POA: Diagnosis not present

## 2024-07-08 DIAGNOSIS — W908XXA Exposure to other nonionizing radiation, initial encounter: Secondary | ICD-10-CM

## 2024-07-08 DIAGNOSIS — L814 Other melanin hyperpigmentation: Secondary | ICD-10-CM | POA: Diagnosis not present

## 2024-07-08 DIAGNOSIS — L821 Other seborrheic keratosis: Secondary | ICD-10-CM

## 2024-07-08 DIAGNOSIS — C4491 Basal cell carcinoma of skin, unspecified: Secondary | ICD-10-CM

## 2024-07-08 DIAGNOSIS — L578 Other skin changes due to chronic exposure to nonionizing radiation: Secondary | ICD-10-CM | POA: Diagnosis not present

## 2024-07-08 DIAGNOSIS — D1801 Hemangioma of skin and subcutaneous tissue: Secondary | ICD-10-CM | POA: Diagnosis not present

## 2024-07-08 DIAGNOSIS — D229 Melanocytic nevi, unspecified: Secondary | ICD-10-CM

## 2024-07-08 DIAGNOSIS — Z85828 Personal history of other malignant neoplasm of skin: Secondary | ICD-10-CM

## 2024-07-08 NOTE — Patient Instructions (Signed)

## 2024-07-08 NOTE — Progress Notes (Unsigned)
   Follow-Up Visit   Subjective  DEKE TILGHMAN is a 83 y.o. male who presents for the following: Skin Cancer Screening and Full Body Skin Exam  The patient presents for Total-Body Skin Exam (TBSE) for skin cancer screening and mole check. The patient has spots, moles and lesions to be evaluated, some may be new or changing.  The following portions of the chart were reviewed this encounter and updated as appropriate: medications, allergies, medical history  Review of Systems:  No other skin or systemic complaints except as noted in HPI or Assessment and Plan.  Objective  Well appearing patient in no apparent distress; mood and affect are within normal limits.  A full examination was performed including scalp, head, eyes, ears, nose, lips, neck, chest, axillae, abdomen, back, buttocks, bilateral upper extremities, bilateral lower extremities, hands, feet, fingers, toes, fingernails, and toenails. All findings within normal limits unless otherwise noted below.   Relevant physical exam findings are noted in the Assessment and Plan.    Assessment & Plan   SKIN CANCER SCREENING PERFORMED TODAY.  ACTINIC DAMAGE - Chronic condition, secondary to cumulative UV/sun exposure - diffuse scaly erythematous macules with underlying dyspigmentation - Recommend daily broad spectrum sunscreen SPF 30+ to sun-exposed areas, reapply every 2 hours as needed.  - Staying in the shade or wearing long sleeves, sun glasses (UVA+UVB protection) and wide brim hats (4-inch brim around the entire circumference of the hat) are also recommended for sun protection.  - Call for new or changing lesions.  LENTIGINES, SEBORRHEIC KERATOSES, HEMANGIOMAS - Benign normal skin lesions - Benign-appearing - Call for any changes  MELANOCYTIC NEVI - Tan-brown and/or pink-flesh-colored symmetric macules and papules - Benign appearing on exam today - Observation - Call clinic for new or changing moles - Recommend daily use  of broad spectrum spf 30+ sunscreen to sun-exposed areas.   HISTORY OF BASAL CELL CARCINOMA OF THE SKIN - No evidence of recurrence today - Recommend regular full body skin exams - Recommend daily broad spectrum sunscreen SPF 30+ to sun-exposed areas, reapply every 2 hours as needed.  - Call if any new or changing lesions are noted between office visits   Scar s/p Mohs for [facility] on the conchal bowl , treated on 12/25/23, repaired with FTSG; partial graft failure secondary to inadequate ointment and coverage- now healed - No evidence of recurrence today - Recommend daily broad spectrum sunscreen SPF 30+ to sun-exposed areas, reapply every 2 hours as needed.    Return in about 6 months (around 01/08/2025) for TBSC.  I, Berwyn Lesches, Surg Tech III, am acting as scribe for RUFUS CHRISTELLA HOLY, MD.   LILLETTE Rollene Gobble, RN, am acting as scribe for RUFUS CHRISTELLA HOLY, MD .   Documentation: I have reviewed the above documentation for accuracy and completeness, and I agree with the above.  RUFUS CHRISTELLA HOLY, MD

## 2024-12-14 ENCOUNTER — Encounter: Payer: Self-pay | Admitting: Family Medicine

## 2024-12-14 ENCOUNTER — Ambulatory Visit (INDEPENDENT_AMBULATORY_CARE_PROVIDER_SITE_OTHER): Admitting: Family Medicine

## 2024-12-14 DIAGNOSIS — Z0000 Encounter for general adult medical examination without abnormal findings: Secondary | ICD-10-CM | POA: Diagnosis not present

## 2024-12-14 NOTE — Progress Notes (Signed)
 "  ----------------------------------------------------------------------------------------------------------------------------------------------------------------------------------------------------------------------  Because this visit was a virtual/telehealth visit, some criteria may be missing or patient reported. Any vitals not documented were not able to be obtained and vitals that have been documented are patient reported.    MEDICARE ANNUAL PREVENTIVE CARE VISIT WITH PROVIDER (Welcome to Medicare, initial annual wellness or annual wellness exam)  Virtual Visit via Video Note  I connected with Anthony Skinner on 12/14/2024  by a video enabled telemedicine application and verified that I am speaking with the correct person using two identifiers.  Location patient: [place], in their car Location provider:work or home office Persons participating in the virtual visit: patient, provider, daughter  Concerns and/or follow up today: detailed intake and health/risks assessment completed on flow sheets and below- please see for details. No concerns. 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 Active and content per his report.   How often do you have a drink containing alcohol?n How many drinks containing alcohol do you have on a typical day when you are drinking?na How often do you have six or more drinks on one occasion?na Have you ever smoked?y Quit date if applicable? Over 40 years ago  How many packs a day do/did you smoke? na Do you use smokeless tobacco?n Do you use an illicit drugs?n Do you feel safe at home?y Last dentist visit?dentures Last eye Exam and location? Saw last week, [facility]   See HM section in Epic for other details of completed HM.    ROS: negative for report of fevers, unintentional weight loss, vision changes, vision loss, hearing loss or change, chest pain, sob, hemoptysis, melena, hematochezia, hematuria,bleeding or bruising  Patient-completed  extensive health risk assessment - reviewed and discussed with the patient: See Health Risk Assessment completed with patient prior to the visit either above or in recent phone note. This was reviewed in detailed with the patient today and appropriate recommendations, orders and referrals were placed as needed per Summary below and patient instructions.   Review of Medical History: -PMH, PSH, Family History and current specialty and care providers reviewed and updated and listed below   Patient Care Team: Theophilus Andrews, Tully GRADE, MD as PCP - General (Internal Medicine) Levern Hutching, MD (Cardiology)   Past Medical History:  Diagnosis Date   Alzheimer disease [facility])    Basal cell carcinoma    CAD (coronary artery disease)    a. reported h/o MI in the 43's;  b. 04/2000 Cath: LM nl, LAD 40p, D1 small, nl, RI nl, LCX nl, RCA nl.   Dementia (HCC)    Depression    DJD (degenerative joint disease)    Fatty liver    Gastropathy 2012   reactive   GERD (gastroesophageal reflux disease)    Hiatal hernia    Hyperlipidemia    Hypertension    Hypertension    Iron deficiency anemia    Myocardial infarction (HCC)     BACK IN THE 90'S   Prostate cancer (HCC)    a. 09/2008 s/p prostatectomy.   PUD (peptic ulcer disease)    Recurrent spontaneous pneumothorax    a. s/p L lobectomy in 1966.   Shortness of breath    Squamous cell carcinoma of skin    Suicide attempt (HCC)    a. 08/2013 attempt by hanging with subsequent resp failure   Syncope    a. in setting of GIB in 2012, presumed to be orthostatic.   Tubular adenoma of colon 2012   Upper  GI bleed    a. 2012   Vertebral artery stenosis    a. 09/2010 s/p L vertebral stenting 09/2010.    Past Surgical History:  Procedure Laterality Date   CARDIAC CATHETERIZATION  09/15/2013   CARDIAC SURGERY     CATARACT EXTRACTION Right    LEFT HEART CATHETERIZATION WITH CORONARY ANGIOGRAM N/A 09/15/2013   Procedure: LEFT HEART CATHETERIZATION  WITH CORONARY ANGIOGRAM;  Surgeon: Lonni JONETTA Cash, MD;  Location: [facility] CATH LAB;  Service: Cardiovascular;  Laterality: N/A;   LUNG REMOVAL, PARTIAL  1960s   left   PROSTATECTOMY     vertebral artery stent      Social History   Socioeconomic History   Marital status: Married    Spouse name: Gracie   Number of children: 0   Years of education: 12th   Highest education level: Not on file  Occupational History   Occupation: retired    Associate Professor: RETIRED  Tobacco Use   Smoking status: Former    Current packs/day: 0.00    Average packs/day: 1 pack/day for 40.0 years (40.0 ttl pk-yrs)    Types: Cigarettes    Start date: 08/27/1962    Quit date: 08/27/2002    Years since quitting: 22.3   Smokeless tobacco: Never  Vaping Use   Vaping status: Never Used  Substance and Sexual Activity   Alcohol use: No    Comment: Hx heavy EtOH use but quit 2011   Drug use: No   Sexual activity: Not Currently  Other Topics Concern   Not on file  Social History Narrative   Lives in [place] with wife.  Retired from landscape architect (repair/upholstery).   Caffeine Use: 4 cups daily       Social Drivers of Health   Tobacco Use: Medium Risk (12/14/2024)   Patient History    Smoking Tobacco Use: Former    Smokeless Tobacco Use: Never    Passive Exposure: Not on file  Financial Resource Strain: Low Risk (09/03/2023)   Overall Financial Resource Strain (CARDIA)    Difficulty of Paying Living Expenses: Not hard at all  Food Insecurity: No Food Insecurity (09/03/2023)   Hunger Vital Sign    Worried About Running Out of Food in the Last Year: Never true    Ran Out of Food in the Last Year: Never true  Transportation Needs: No Transportation Needs (09/03/2023)   PRAPARE - Administrator, Civil Service (Medical): No    Lack of Transportation (Non-Medical): No  Physical Activity: Sufficiently Active (12/14/2024)   Exercise Vital Sign    Days of Exercise per Week: 5 days    Minutes of  Exercise per Session: 30 min  Stress: No Stress Concern Present (12/14/2024)   [employer] of Occupational Health - Occupational Stress Questionnaire    Feeling of Stress: Not at all  Social Connections: Unknown (12/14/2024)   Social Connection and Isolation Panel    Frequency of Communication with Friends and Family: More than three times a week    Frequency of Social Gatherings with Friends and Family: More than three times a week    Attends Religious Services: Never    Database Administrator or Organizations: Yes    Attends Banker Meetings: More than 4 times per year    Marital Status: Patient unable to answer  Intimate Partner Violence: Not At Risk (09/03/2023)   Humiliation, Afraid, Rape, and Kick questionnaire    Fear of Current or Ex-Partner: No  Emotionally Abused: No    Physically Abused: No    Sexually Abused: No  Depression (PHQ2-9): Low Risk (12/14/2024)   Depression (PHQ2-9)    PHQ-2 Score: 0  Alcohol Screen: Not on file  Housing: Low Risk (09/03/2023)   Housing    Last Housing Risk Score: 0  Utilities: Not At Risk (09/03/2023)   AHC Utilities    Threatened with loss of utilities: No  Health Literacy: Inadequate Health Literacy (09/03/2023)   B1300 Health Literacy    Frequency of need for help with medical instructions: Sometimes    Family History  Problem Relation Age of Onset   Alzheimer's disease Mother    Heart attack Father    Prostate cancer Brother     Medications Ordered Prior to Encounter[1]  Allergies[2]     Physical Exam Vitals requested from patient and listed below if patient had equipment and was able to obtain at home for this virtual visit: There were no vitals filed for this visit. Estimated body mass index is 24.09 kg/m as calculated from the following:   Height as of 04/01/24: 6' (1.829 m).   Weight as of 04/01/24: 177 lb 9.6 oz (80.6 kg).  EKG (optional): deferred due to virtual visit  GENERAL: alert, oriented,  no acute distress detected; full vision exam deferred due to pandemic and/or virtual encounter  HEENT: atraumatic, conjunttiva clear, no obvious abnormalities on inspection of external nose and ears  NECK: normal movements of the head and neck  LUNGS: on inspection no signs of respiratory distress, breathing rate appears normal, no obvious gross SOB, gasping or wheezing  CV: no obvious cyanosis  MS: moves all visible extremities without noticeable abnormality  PSYCH/NEURO: pleasant and cooperative, no obvious depression or anxiety, speech and thought processing grossly intact, Cognitive function grossly intact  Flowsheet Row Office Visit from 10/30/2023 in [facility] [address] [facility]  PHQ-9 Total Score 0        12/14/2024    2:49 PM 10/30/2023   10:03 AM 09/03/2023    7:32 AM 07/23/2023    8:47 AM 11/28/2022   11:24 AM  Depression screen PHQ 2/9  Decreased Interest 0 0 0 1 0  Down, Depressed, Hopeless 0 0 0 1 0  PHQ - 2 Score 0 0 0 2 0  Altered sleeping  0 0 1 0  Tired, decreased energy  0 0 0 1  Change in appetite  0 0 1 0  Feeling bad or failure about yourself   0 0 1 0  Trouble concentrating  0 0 1 0  Moving slowly or fidgety/restless  0 0 1 0  Suicidal thoughts  0 0 0 0  PHQ-9 Score  0  0  7  1   Difficult doing work/chores   Not difficult at all  Not difficult at all     Data saved with a previous flowsheet row definition       07/23/2023    8:46 AM 09/03/2023    7:21 AM 10/30/2023   10:02 AM 12/14/2024    2:49 PM 12/14/2024    3:03 PM  Fall Risk  Falls in the past year? 1 1 1  0 0  Was there an injury with Fall? 1  1  1   0 0  Fall Risk Category Calculator 2 2 2  0 0  Patient at Risk for Falls Due to    No Fall Risks No Fall Risks  Fall risk Follow up Falls evaluation completed Falls evaluation  completed Falls evaluation completed Falls evaluation completed Falls evaluation completed     Data saved with a previous flowsheet row definition      SUMMARY AND PLAN:  Encounter for Medicare annual wellness exam  Discussed applicable health maintenance/preventive health measures and advised and referred or ordered per patient preferences: -he is utd   Health Maintenance  Topic Date Due   COVID-19 Vaccine (10 - Pfizer risk 2025-26 season) 03/13/2025   Medicare Annual Wellness (AWV)  12/14/2025   DTaP/Tdap/Td (3 - Td or Tdap) 10/24/2032   Pneumococcal Vaccine: 50+ Years  Completed   Influenza Vaccine  Completed   Zoster Vaccines- Shingrix  Completed   Meningococcal B Vaccine  Aged Out   Colonoscopy  Discontinued     Education and counseling on the following was provided based on the above review of health and a plan/checklist for the patient, along with additional information discussed, was provided for the patient in the patient instructions : -Advised and counseled on a healthy lifestyle - including the importance of a healthy diet, regular physical activity, social connections and stress management. -Reviewed patient's current diet. Advised and counseled on a whole foods based healthy diet. A summary of a healthy diet was provided in the Patient Instructions.  -reviewed patient's current physical activity level and discussed exercise guidelines for adults. He is getting in a lot of walking and some strength training with pushing others around. Advised to continue to be active and provided further information in patient instructions.  -Advise yearly dental visits at minimum and regular eye exams  Follow up: see patient instructions   Patient Instructions  I really enjoyed getting to talk with you today! I am available on Tuesdays and Thursdays for virtual visits if you have any questions or concerns, or if I can be of any further assistance.   CHECKLIST FROM ANNUAL WELLNESS VISIT:  -Follow up (please call to schedule if not scheduled after visit):   -yearly for annual wellness visit with primary care office  Here is a  list of your preventive care/health maintenance measures and the plan for each if any are due:  PLAN For any measures below that may be due:   Health Maintenance  Topic Date Due   Medicare Annual Wellness (AWV)  09/02/2024   COVID-19 Vaccine (10 - Pfizer risk 2025-26 season) 03/13/2025   DTaP/Tdap/Td (3 - Td or Tdap) 10/24/2032   Pneumococcal Vaccine: 50+ Years  Completed   Influenza Vaccine  Completed   Zoster Vaccines- Shingrix  Completed   Meningococcal B Vaccine  Aged Out   Colonoscopy  Discontinued    -See a dentist at least yearly  -Get your eyes checked and then per your eye specialist's recommendations  -Other issues addressed today:   -I have included below further information regarding a healthy whole foods based diet, physical activity guidelines for adults, stress management and opportunities for social connections. I hope you find this information useful.   -----------------------------------------------------------------------------------------------------------------------------------------------------------------------------------------------------------------------------------------------------------    NUTRITION: -eat real food: lots of colorful vegetables (half the plate) and fruits -5-7 servings of vegetables and fruits per day (fresh or steamed is best), exp. 2 servings of vegetables with lunch and dinner and 2 servings of fruit per day. Berries and greens such as kale and collards are great choices.  -consume on a regular basis:  fresh fruits, fresh veggies, fish, nuts, seeds, healthy oils (such as olive oil, avocado oil), whole grains (make sure for bread/pasta/crackers/etc., that the first ingredient on label contains the  word whole), legumes. -can eat small amounts of dairy and lean meat (no larger than the palm of your hand), but avoid processed meats such as ham, bacon, lunch meat, etc. -drink water -try to avoid fast food and pre-packaged foods,  processed meat, ultra processed foods/beverages (donuts, candy, etc.) -most experts advise limiting sodium to < 2300mg  per day, should limit further is any chronic conditions such as high blood pressure, heart disease, diabetes, etc. The American Heart Association advised that < 1500mg  is is ideal -try to avoid foods/beverages that contain any ingredients with names you do not recognize  -try to avoid foods/beverages  with added sugar or sweeteners/sweets  -try to avoid sweet drinks (including diet drinks): soda, juice, Gatorade, sweet tea, power drinks, diet drinks -try to avoid white rice, white bread, pasta (unless whole grain)  EXERCISE GUIDELINES FOR ADULTS: -if you wish to increase your physical activity, do so gradually and with the approval of your doctor -STOP and seek medical care immediately if you have any chest pain, chest discomfort or trouble breathing when starting or increasing exercise  -move and stretch your body, legs, feet and arms when sitting for long periods -Physical activity guidelines for optimal health in adults: -get at least 150 minutes per week of moderate exercise (can talk, but not sing); this is about 20-30 minutes of sustained activity 5-7 days per week or two 10-15 minute episodes of sustained activity 5-7 days per week -do some muscle building/resistance training/strength training at least 2 days per week  -balance exercises 3+ days per week:   Stand somewhere where you have something sturdy to hold onto if you lose balance    1) lift up on toes, then back down, start with 5x per day and work up to 20x   2) stand and lift one leg straight out to the side so that foot is a few inches of the floor, start with 5x each side and work up to 20x each side   3) stand on one foot, start with 5 seconds each side and work up to 20 seconds on each side  If you need ideas or help with getting more active:  -Silver sneakers https://tools.silversneakers.com  -Walk with  a Doc: Http://www.duncan-williams.com/  -try to include resistance (weight lifting/strength building) and balance exercises twice per week: or the following link for ideas: http://castillo-powell.com/  buyducts.dk  STRESS MANAGEMENT: -can try meditating, or just sitting quietly with deep breathing while intentionally relaxing all parts of your body for 5 minutes daily -if you need further help with stress, anxiety or depression please follow up with your primary doctor or contact the wonderful folks at [facility]: [phone]  SOCIAL CONNECTIONS: -options in [place] if you wish to engage in more social and exercise related activities:  -Silver sneakers https://tools.silversneakers.com  -Walk with a Doc: Http://www.duncan-williams.com/  -Check out the [facility] Active Adults 50+ section on the [place] [employer] companies (hiking clubs, book clubs, cards and games, chess, exercise classes, aquatic classes and much more) - see the website for details: https://www.[place].gov/departments/parks-recreation/active-adults50  -YouTube has lots of exercise videos for different ages and abilities as well  -[facility] (a variety of indoor and outdoor inperson activities for adults). [phone]. [address]  -Virtual Online Classes (a variety of topics): see seniorplanet.org or call [phone]  -consider volunteering at a school, hospice center, church, senior center or elsewhere            Chiquita JONELLE Cramp, DO     [  1]  Current Outpatient Medications on File Prior to Visit  Medication Sig Dispense Refill   acetaminophen  (TYLENOL ) 325 MG tablet Take 2 tablets (650 mg total) by mouth every 6 (six) hours as needed. 30 tablet 2   amLODipine  (NORVASC ) 5 MG tablet Take 1 tablet (5 mg total) by mouth daily. 90 tablet 1   Cholecalciferol (D3 ADULT PO) Take 250 mg  by mouth.     ibuprofen  (MOTRIN  IB) 200 MG tablet Take 1 tablet (200 mg total) by mouth every 6 (six) hours as needed. 90 tablet 0   sertraline  (ZOLOFT ) 100 MG tablet Take 1 tablet (100 mg total) by mouth daily. 90 tablet 1   valsartan -hydrochlorothiazide  (DIOVAN -HCT) 80-12.5 MG tablet Take 1 tablet by mouth daily. 90 tablet 1   cyanocobalamin  (VITAMIN B12) 1000 MCG/ML injection Inject 1 ml into the muscle once a week for a month.  Then injected 1 ml once a month thereafter (Patient not taking: Reported on 12/14/2024) 6 mL 11   gentamicin  ointment (GARAMYCIN ) 0.1 % Apply 1 Application topically 3 (three) times daily. Mixed with mupirocin  applied to graft, covered with a bandage (Patient not taking: Reported on 12/14/2024) 15 g 0   mupirocin  ointment (BACTROBAN ) 2 % Apply 1 Application topically 3 (three) times daily. To ear mixed with gentamicin  covered with a bandage (Patient not taking: Reported on 12/14/2024) 22 g 0   No current facility-administered medications on file prior to visit.  [2] No Known Allergies  "

## 2024-12-14 NOTE — Patient Instructions (Signed)
 I really enjoyed getting to talk with you today! I am available on Tuesdays and Thursdays for virtual visits if you have any questions or concerns, or if I can be of any further assistance.   CHECKLIST FROM ANNUAL WELLNESS VISIT:  -Follow up (please call to schedule if not scheduled after visit):   -yearly for annual wellness visit with primary care office  Here is a list of your preventive care/health maintenance measures and the plan for each if any are due:  PLAN For any measures below that may be due:   Health Maintenance  Topic Date Due   Medicare Annual Wellness (AWV)  09/02/2024   COVID-19 Vaccine (10 - Pfizer risk 2025-26 season) 03/13/2025   DTaP/Tdap/Td (3 - Td or Tdap) 10/24/2032   Pneumococcal Vaccine: 50+ Years  Completed   Influenza Vaccine  Completed   Zoster Vaccines- Shingrix  Completed   Meningococcal B Vaccine  Aged Out   Colonoscopy  Discontinued    -See a dentist at least yearly  -Get your eyes checked and then per your eye specialist's recommendations  -Other issues addressed today:   -I have included below further information regarding a healthy whole foods based diet, physical activity guidelines for adults, stress management and opportunities for social connections. I hope you find this information useful.   -----------------------------------------------------------------------------------------------------------------------------------------------------------------------------------------------------------------------------------------------------------    NUTRITION: -eat real food: lots of colorful vegetables (half the plate) and fruits -5-7 servings of vegetables and fruits per day (fresh or steamed is best), exp. 2 servings of vegetables with lunch and dinner and 2 servings of fruit per day. Berries and greens such as kale and collards are great choices.  -consume on a regular basis:  fresh fruits, fresh veggies, fish, nuts, seeds, healthy oils  (such as olive oil, avocado oil), whole grains (make sure for bread/pasta/crackers/etc., that the first ingredient on label contains the word whole), legumes. -can eat small amounts of dairy and lean meat (no larger than the palm of your hand), but avoid processed meats such as ham, bacon, lunch meat, etc. -drink water -try to avoid fast food and pre-packaged foods, processed meat, ultra processed foods/beverages (donuts, candy, etc.) -most experts advise limiting sodium to < 2300mg  per day, should limit further is any chronic conditions such as high blood pressure, heart disease, diabetes, etc. The American Heart Association advised that < 1500mg  is is ideal -try to avoid foods/beverages that contain any ingredients with names you do not recognize  -try to avoid foods/beverages  with added sugar or sweeteners/sweets  -try to avoid sweet drinks (including diet drinks): soda, juice, Gatorade, sweet tea, power drinks, diet drinks -try to avoid white rice, white bread, pasta (unless whole grain)  EXERCISE GUIDELINES FOR ADULTS: -if you wish to increase your physical activity, do so gradually and with the approval of your doctor -STOP and seek medical care immediately if you have any chest pain, chest discomfort or trouble breathing when starting or increasing exercise  -move and stretch your body, legs, feet and arms when sitting for long periods -Physical activity guidelines for optimal health in adults: -get at least 150 minutes per week of moderate exercise (can talk, but not sing); this is about 20-30 minutes of sustained activity 5-7 days per week or two 10-15 minute episodes of sustained activity 5-7 days per week -do some muscle building/resistance training/strength training at least 2 days per week  -balance exercises 3+ days per week:   Stand somewhere where you have something sturdy to hold onto if  you lose balance    1) lift up on toes, then back down, start with 5x per day and work up  to 20x   2) stand and lift one leg straight out to the side so that foot is a few inches of the floor, start with 5x each side and work up to 20x each side   3) stand on one foot, start with 5 seconds each side and work up to 20 seconds on each side  If you need ideas or help with getting more active:  -Silver sneakers https://tools.silversneakers.com  -Walk with a Doc: Http://www.duncan-williams.com/  -try to include resistance (weight lifting/strength building) and balance exercises twice per week: or the following link for ideas: http://castillo-powell.com/  buyducts.dk  STRESS MANAGEMENT: -can try meditating, or just sitting quietly with deep breathing while intentionally relaxing all parts of your body for 5 minutes daily -if you need further help with stress, anxiety or depression please follow up with your primary doctor or contact the wonderful folks at [facility]: [phone]  SOCIAL CONNECTIONS: -options in [place] if you wish to engage in more social and exercise related activities:  -Silver sneakers https://tools.silversneakers.com  -Walk with a Doc: Http://www.duncan-williams.com/  -Check out the [facility] Active Adults 50+ section on the [place] [employer] (hiking clubs, book clubs, cards and games, chess, exercise classes, aquatic classes and much more) - see the website for details: https://www.[place].gov/departments/parks-recreation/active-adults50  -YouTube has lots of exercise videos for different ages and abilities as well  -[facility] (a variety of indoor and outdoor inperson activities for adults). [phone]. [address]  -Virtual Online Classes (a variety of topics): see seniorplanet.org or call [phone]  -consider volunteering at a school, hospice center, church, senior center or elsewhere

## 2024-12-31 ENCOUNTER — Telehealth: Payer: Self-pay | Admitting: Internal Medicine

## 2024-12-31 NOTE — Telephone Encounter (Signed)
 Patient's caregiver Marena Lesch) dropped off document FL2, to be filled out by provider. Patient requested to send it back via Call Patient to pick up within 5-days. Document is located in providers tray at front office.Please advise at [phone]

## 2025-01-04 NOTE — Telephone Encounter (Signed)
 Placed in Dr Hardie Shackleton folder

## 2025-01-06 ENCOUNTER — Ambulatory Visit: Admitting: Dermatology

## 2025-01-06 NOTE — Telephone Encounter (Signed)
 Form completed. Left message on machine.

## 2025-01-07 ENCOUNTER — Telehealth: Payer: Self-pay

## 2025-01-07 NOTE — Telephone Encounter (Signed)
 Copied from CRM #[id number]. Topic: Clinical - Medical Advice >> Jan 07, 2025 11:23 AM Laymon HERO wrote: Reason for CRM: TSABRA Gash - [place] #[id number]- Patient had a fall this morning- He is okay- EMS evualated and he is fine- out doing errands with his daughter now. Wanted to update Dr Theophilus Andrews

## 2025-01-25 ENCOUNTER — Encounter: Admitting: Internal Medicine

## 2025-02-02 ENCOUNTER — Encounter: Admitting: Internal Medicine

## 2025-03-29 ENCOUNTER — Ambulatory Visit: Admitting: Dermatology
